# Patient Record
Sex: Male | Born: 1940 | Race: Black or African American | Hispanic: No | Marital: Married | Smoking: Former smoker
Health system: Southern US, Community
[De-identification: ages and names within clinical notes are randomized; demographics above are authoritative.]

## PROBLEM LIST (undated history)

## (undated) DIAGNOSIS — IMO0001 Reserved for inherently not codable concepts without codable children: Secondary | ICD-10-CM

## (undated) DIAGNOSIS — K219 Gastro-esophageal reflux disease without esophagitis: Secondary | ICD-10-CM

## (undated) DIAGNOSIS — N200 Calculus of kidney: Secondary | ICD-10-CM

## (undated) DIAGNOSIS — E7800 Pure hypercholesterolemia, unspecified: Secondary | ICD-10-CM

## (undated) DIAGNOSIS — K5730 Diverticulosis of large intestine without perforation or abscess without bleeding: Secondary | ICD-10-CM

## (undated) DIAGNOSIS — H6980 Other specified disorders of Eustachian tube, unspecified ear: Secondary | ICD-10-CM

## (undated) DIAGNOSIS — E785 Hyperlipidemia, unspecified: Secondary | ICD-10-CM

## (undated) DIAGNOSIS — E669 Obesity, unspecified: Secondary | ICD-10-CM

## (undated) DIAGNOSIS — E0590 Thyrotoxicosis, unspecified without thyrotoxic crisis or storm: Secondary | ICD-10-CM

## (undated) DIAGNOSIS — R972 Elevated prostate specific antigen [PSA]: Secondary | ICD-10-CM

## (undated) DIAGNOSIS — R011 Cardiac murmur, unspecified: Secondary | ICD-10-CM

## (undated) DIAGNOSIS — J449 Chronic obstructive pulmonary disease, unspecified: Secondary | ICD-10-CM

## (undated) DIAGNOSIS — T783XXA Angioneurotic edema, initial encounter: Secondary | ICD-10-CM

## (undated) DIAGNOSIS — I10 Essential (primary) hypertension: Secondary | ICD-10-CM

## (undated) DIAGNOSIS — M109 Gout, unspecified: Secondary | ICD-10-CM

## (undated) DIAGNOSIS — E042 Nontoxic multinodular goiter: Secondary | ICD-10-CM

## (undated) HISTORY — DX: Chronic obstructive pulmonary disease, unspecified: J44.9

## (undated) HISTORY — DX: Elevated prostate specific antigen (PSA): R97.20

## (undated) HISTORY — DX: Thyrotoxicosis, unspecified without thyrotoxic crisis or storm: E05.90

## (undated) HISTORY — DX: Pure hypercholesterolemia, unspecified: E78.00

## (undated) HISTORY — DX: Obesity, unspecified: E66.9

## (undated) HISTORY — DX: Essential (primary) hypertension: I10

## (undated) HISTORY — DX: Gout, unspecified: M10.9

## (undated) HISTORY — DX: Angioneurotic edema, initial encounter: T78.3XXA

## (undated) HISTORY — DX: Nontoxic multinodular goiter: E04.2

## (undated) HISTORY — DX: Other specified disorders of Eustachian tube, unspecified ear: H69.80

## (undated) HISTORY — DX: Diverticulosis of large intestine without perforation or abscess without bleeding: K57.30

---
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
## 1988-10-23 HISTORY — PX: HERNIA REPAIR: SHX51

## 1988-10-23 HISTORY — PX: KNEE SURGERY: SHX244

## 2002-02-22 LAB — HM COLONOSCOPY

## 2004-02-23 HISTORY — PX: CARDIOVASCULAR STRESS TEST: SHX262

## 2006-03-01 ENCOUNTER — Ambulatory Visit: Payer: Self-pay | Admitting: Family Medicine

## 2006-05-12 ENCOUNTER — Ambulatory Visit: Payer: Self-pay | Admitting: Family Medicine

## 2006-05-18 ENCOUNTER — Ambulatory Visit: Payer: Self-pay | Admitting: Family Medicine

## 2006-08-30 ENCOUNTER — Telehealth: Payer: Self-pay | Admitting: Family Medicine

## 2006-10-28 ENCOUNTER — Ambulatory Visit: Payer: Self-pay | Admitting: Family Medicine

## 2006-12-16 ENCOUNTER — Ambulatory Visit: Payer: Self-pay | Admitting: Family Medicine

## 2006-12-21 ENCOUNTER — Encounter: Payer: Self-pay | Admitting: Family Medicine

## 2006-12-21 ENCOUNTER — Ambulatory Visit: Payer: Self-pay | Admitting: Family Medicine

## 2006-12-21 DIAGNOSIS — M25559 Pain in unspecified hip: Secondary | ICD-10-CM | POA: Insufficient documentation

## 2006-12-21 DIAGNOSIS — M545 Low back pain, unspecified: Secondary | ICD-10-CM | POA: Insufficient documentation

## 2006-12-22 ENCOUNTER — Encounter: Payer: Self-pay | Admitting: Family Medicine

## 2006-12-23 DIAGNOSIS — R9389 Abnormal findings on diagnostic imaging of other specified body structures: Secondary | ICD-10-CM | POA: Insufficient documentation

## 2006-12-28 DIAGNOSIS — H6980 Other specified disorders of Eustachian tube, unspecified ear: Secondary | ICD-10-CM | POA: Insufficient documentation

## 2006-12-28 DIAGNOSIS — H6990 Unspecified Eustachian tube disorder, unspecified ear: Secondary | ICD-10-CM | POA: Insufficient documentation

## 2006-12-28 DIAGNOSIS — L723 Sebaceous cyst: Secondary | ICD-10-CM | POA: Insufficient documentation

## 2006-12-28 HISTORY — DX: Other specified disorders of Eustachian tube, unspecified ear: H69.80

## 2006-12-28 HISTORY — DX: Unspecified eustachian tube disorder, unspecified ear: H69.90

## 2006-12-29 ENCOUNTER — Encounter: Payer: Self-pay | Admitting: Family Medicine

## 2006-12-29 ENCOUNTER — Ambulatory Visit: Payer: Self-pay | Admitting: Family Medicine

## 2006-12-30 ENCOUNTER — Encounter (INDEPENDENT_AMBULATORY_CARE_PROVIDER_SITE_OTHER): Payer: Self-pay | Admitting: *Deleted

## 2007-01-05 ENCOUNTER — Encounter (INDEPENDENT_AMBULATORY_CARE_PROVIDER_SITE_OTHER): Payer: Self-pay | Admitting: *Deleted

## 2007-01-12 ENCOUNTER — Encounter: Payer: Self-pay | Admitting: Family Medicine

## 2007-01-12 DIAGNOSIS — K5730 Diverticulosis of large intestine without perforation or abscess without bleeding: Secondary | ICD-10-CM

## 2007-01-12 DIAGNOSIS — E669 Obesity, unspecified: Secondary | ICD-10-CM

## 2007-01-12 DIAGNOSIS — I10 Essential (primary) hypertension: Secondary | ICD-10-CM

## 2007-01-12 DIAGNOSIS — N200 Calculus of kidney: Secondary | ICD-10-CM | POA: Insufficient documentation

## 2007-01-12 DIAGNOSIS — M109 Gout, unspecified: Secondary | ICD-10-CM

## 2007-01-12 DIAGNOSIS — M1A9XX0 Chronic gout, unspecified, without tophus (tophi): Secondary | ICD-10-CM | POA: Insufficient documentation

## 2007-01-12 HISTORY — DX: Diverticulosis of large intestine without perforation or abscess without bleeding: K57.30

## 2007-01-12 HISTORY — DX: Obesity, unspecified: E66.9

## 2007-01-12 HISTORY — DX: Essential (primary) hypertension: I10

## 2007-01-12 HISTORY — DX: Gout, unspecified: M10.9

## 2007-01-31 ENCOUNTER — Ambulatory Visit: Payer: Self-pay | Admitting: Family Medicine

## 2007-03-01 ENCOUNTER — Telehealth: Payer: Self-pay | Admitting: Family Medicine

## 2007-03-02 ENCOUNTER — Ambulatory Visit: Payer: Self-pay | Admitting: Family Medicine

## 2007-05-31 ENCOUNTER — Ambulatory Visit: Payer: Self-pay | Admitting: Family Medicine

## 2007-06-20 ENCOUNTER — Ambulatory Visit: Payer: Self-pay | Admitting: Family Medicine

## 2007-06-20 DIAGNOSIS — E7800 Pure hypercholesterolemia, unspecified: Secondary | ICD-10-CM

## 2007-06-20 HISTORY — DX: Pure hypercholesterolemia, unspecified: E78.00

## 2007-06-22 ENCOUNTER — Ambulatory Visit: Payer: Self-pay | Admitting: Family Medicine

## 2007-06-22 LAB — CONVERTED CEMR LAB
OCCULT 1: NEGATIVE
OCCULT 3: NEGATIVE

## 2007-06-23 LAB — CONVERTED CEMR LAB
Albumin: 3.6 g/dL (ref 3.5–5.2)
Alkaline Phosphatase: 29 units/L — ABNORMAL LOW (ref 39–117)
BUN: 19 mg/dL (ref 6–23)
Bilirubin, Direct: 0.1 mg/dL (ref 0.0–0.3)
Calcium: 9.2 mg/dL (ref 8.4–10.5)
GFR calc Af Amer: 86 mL/min
GFR calc non Af Amer: 71 mL/min
HDL: 53.1 mg/dL (ref >39.0)
LDL Cholesterol: 105 mg/dL — ABNORMAL HIGH (ref 0–99)
PSA: 2.22 ng/mL (ref 0.10–4.00)
Potassium: 3.7 meq/L (ref 3.5–5.1)
Sodium: 142 meq/L (ref 135–145)
VLDL: 12 mg/dL (ref 0–40)

## 2007-11-20 ENCOUNTER — Ambulatory Visit: Payer: Self-pay | Admitting: Family Medicine

## 2008-04-18 ENCOUNTER — Ambulatory Visit: Payer: Self-pay | Admitting: Family Medicine

## 2008-05-22 ENCOUNTER — Ambulatory Visit: Payer: Self-pay | Admitting: Family Medicine

## 2008-05-22 LAB — CONVERTED CEMR LAB: LDL Goal: 130 mg/dL

## 2008-07-19 ENCOUNTER — Ambulatory Visit: Payer: Self-pay | Admitting: Family Medicine

## 2008-07-19 LAB — CONVERTED CEMR LAB
Alkaline Phosphatase: 39 units/L (ref 39–117)
Bilirubin, Direct: 0.1 mg/dL (ref 0.0–0.3)
CO2: 31 meq/L (ref 19–32)
Calcium: 9.2 mg/dL (ref 8.4–10.5)
Creatinine, Ser: 1 mg/dL (ref 0.4–1.5)
GFR calc non Af Amer: 95.67 mL/min (ref >60)
HDL: 55.4 mg/dL (ref >39.00)
LDL Cholesterol: 95 mg/dL (ref 0–99)
Sodium: 144 meq/L (ref 135–145)
Total Bilirubin: 1.4 mg/dL — ABNORMAL HIGH (ref 0.3–1.2)
Total CHOL/HDL Ratio: 3
Total Protein: 6.6 g/dL (ref 6.0–8.3)
Triglycerides: 68 mg/dL (ref 0.0–149.0)

## 2008-07-29 ENCOUNTER — Telehealth: Payer: Self-pay | Admitting: Family Medicine

## 2008-07-30 ENCOUNTER — Ambulatory Visit: Payer: Self-pay | Admitting: Family Medicine

## 2008-11-06 ENCOUNTER — Ambulatory Visit: Payer: Self-pay | Admitting: Family Medicine

## 2008-11-06 DIAGNOSIS — J309 Allergic rhinitis, unspecified: Secondary | ICD-10-CM | POA: Insufficient documentation

## 2008-11-07 ENCOUNTER — Telehealth: Payer: Self-pay | Admitting: Family Medicine

## 2008-12-04 ENCOUNTER — Ambulatory Visit: Payer: Self-pay | Admitting: Family Medicine

## 2009-01-30 ENCOUNTER — Ambulatory Visit: Payer: Self-pay | Admitting: Family Medicine

## 2009-02-20 ENCOUNTER — Ambulatory Visit: Payer: Self-pay | Admitting: Family Medicine

## 2009-09-09 ENCOUNTER — Ambulatory Visit: Payer: Self-pay | Admitting: Family Medicine

## 2009-09-09 ENCOUNTER — Telehealth (INDEPENDENT_AMBULATORY_CARE_PROVIDER_SITE_OTHER): Payer: Self-pay | Admitting: *Deleted

## 2009-09-09 LAB — CONVERTED CEMR LAB
ALT: 14 units/L (ref 0–53)
AST: 21 units/L (ref 0–37)
Albumin: 3.7 g/dL (ref 3.5–5.2)
Alkaline Phosphatase: 40 units/L (ref 39–117)
BUN: 19 mg/dL (ref 6–23)
CO2: 31 meq/L (ref 19–32)
Chloride: 106 meq/L (ref 96–112)
Cholesterol: 170 mg/dL (ref 0–200)
GFR calc non Af Amer: 92.15 mL/min (ref >60)
Glucose, Bld: 106 mg/dL — ABNORMAL HIGH (ref 70–99)
PSA: 4.15 ng/mL — ABNORMAL HIGH (ref 0.10–4.00)
Potassium: 3.6 meq/L (ref 3.5–5.1)
Sodium: 140 meq/L (ref 135–145)
VLDL: 13.2 mg/dL (ref 0.0–40.0)

## 2009-10-08 ENCOUNTER — Ambulatory Visit: Payer: Self-pay | Admitting: Family Medicine

## 2009-10-08 DIAGNOSIS — R972 Elevated prostate specific antigen [PSA]: Secondary | ICD-10-CM

## 2009-10-08 HISTORY — DX: Elevated prostate specific antigen (PSA): R97.20

## 2009-10-09 LAB — CONVERTED CEMR LAB
PSA, Free: 1.1 ng/mL
PSA: 4.3 ng/mL — ABNORMAL HIGH (ref 0.10–4.00)

## 2009-10-10 LAB — CONVERTED CEMR LAB: Uric Acid, Serum: 6 mg/dL (ref 4.0–7.8)

## 2009-10-17 ENCOUNTER — Telehealth: Payer: Self-pay | Admitting: Family Medicine

## 2009-11-06 ENCOUNTER — Encounter: Payer: Self-pay | Admitting: Family Medicine

## 2009-11-25 ENCOUNTER — Encounter: Payer: Self-pay | Admitting: Family Medicine

## 2009-11-26 ENCOUNTER — Ambulatory Visit: Payer: Self-pay | Admitting: Family Medicine

## 2009-12-02 ENCOUNTER — Encounter: Payer: Self-pay | Admitting: Family Medicine

## 2010-03-24 NOTE — Consult Note (Signed)
Summary: [facility]  [facility]   Imported By: Edmonia James 12/02/2009 13:11:32  _____________________________________________________________________  External Attachment:    Type:   Image     Comment:   External Document

## 2010-03-24 NOTE — Progress Notes (Signed)
----   Converted from flag ---- ---- 09/08/2009 11:18 PM, Eliezer Lofts MD wrote: CMET, LIPIDS, PSA Dx 272.0, v76.44  ---- 09/08/2009 1:43 PM, Daralene Milch CMA (AAMA) wrote: Pt is scheduled for cpx labs tomorrow, what labs to draw and dx codes? Thanks Tasha ------------------------------

## 2010-03-24 NOTE — Consult Note (Signed)
Summary: [facility]  [facility]   Imported By: Edmonia James 11/14/2009 09:09:23  _____________________________________________________________________  External Attachment:    Type:   Image     Comment:   External Document

## 2010-03-24 NOTE — Assessment & Plan Note (Signed)
Summary: FLU SHOT/CLE  Nurse Visit   Allergies: No Known Drug Allergies  Orders Added: 1)  Flu Vaccine 18yrs + MEDICARE PATIENTS [Q2039] 2)  Administration Flu vaccine - MCR [id number]  Flu Vaccine Consent Questions     Do you have a history of severe allergic reactions to this vaccine? no    Any prior history of allergic reactions to egg and/or gelatin? no    Do you have a sensitivity to the preservative Thimersol? no    Do you have a past history of Guillan-Barre Syndrome? no    Do you currently have an acute febrile illness? no    Have you ever had a severe reaction to latex? no    Vaccine information given and explained to patient? yes    Are you currently pregnant? no    Lot Number:[id number]   Exp Date:08/22/2010   Site Given  Left Deltoid IMu

## 2010-03-24 NOTE — Progress Notes (Signed)
Summary: wants referral to urologist  Phone Note Call from Patient Call back at Home Phone [phone] Call back at [phone]   Caller: [place] Call For: Eliezer Lofts MD Summary of Call: Pt's wife would like for pt to be referred to a urologist, based on his PSA results,  doesnt have a preference where. Initial call taken by: Marty Heck CMA,  October 17, 2009 2:27 PM  Follow-up for Phone Call        Referral made to Urololgy. Follow-up by: Eliezer Lofts MD,  October 20, 2009 3:42 PM  Additional Follow-up for Phone Call Additional follow up Details #1::        Patient advised.Cathlean Cower CMA   Additional Follow-up by: Zenda Alpers CMA Deborra Medina),  October 20, 2009 3:52 PM

## 2010-03-24 NOTE — Consult Note (Signed)
Summary: [facility]  [facility]   Imported By: Laural Benes 12/10/2009 14:02:52  _____________________________________________________________________  External Attachment:    Type:   Image     Comment:   External Document

## 2010-03-24 NOTE — Assessment & Plan Note (Signed)
Summary: CPX/JRR   Vital Signs:  Patient profile:   70 year old male Height:      73.5 inches Weight:      257.6 pounds BMI:     33.65 Temp:     98.8 degrees F oral Pulse rate:   64 / minute Pulse rhythm:   regular BP sitting:   120 / 74  (left arm) Cuff size:   regular  Vitals Entered By: Zenda Alpers CMA Deborra Medina) (October 08, 2009 3:28 PM) CC: Lipid Management  Does patient need assistance? Ambulation Normal  Vision Screening: Color vision testing: normal     Vision Comments: Patient wears glasses  Vision Entered By: Zenda Alpers CMA Deborra Medina) (October 08, 2009 3:29 PM) 40db HL: Left  Right  Audiometry Comment: Patient can her whisper from across the room    History of Present Illness: Chief complaint Adult cpx  HTN, well controlled on current meds. Exercisng 4-5 days a week. No change in diet.     Lipid Management History:      Positive NCEP/ATP III risk factors include male age 16 years old or older and hypertension.  Negative NCEP/ATP III risk factors include non-tobacco-user status.     Problems Prior to Update: 1)  Back Pain  (ICD-724.5) 2)  Allergic Rhinitis  (ICD-477.9) 3)  Uri  (ICD-465.9) 4)  Rotator Cuff Syndrome, Left  (ICD-726.10) 5)  Special Screening Malig Neoplasms Other Sites  (ICD-V76.49) 6)  Special Screening Malignant Neoplasm of Prostate  (ICD-V76.44) 7)  Hypercholesterolemia  (ICD-272.0) 8)  Obesity  (ICD-278.00) 9)  Renal Calculus, Uric Acid  (ICD-274.11) 10)  Hypertension  (ICD-401.9) 11)  Gout  (ICD-274.9) 12)  Diverticulosis, Colon  (ICD-562.10) 13)  Sebaceous Cyst  (ICD-706.2) 14)  Eustachian Tube Dysfunction, Bilateral  (ICD-381.81) 15)  Oth Nonspec Abn Find Rad&oth Ex Body Structure  (ICD-793.99) 16)  Low Back Pain, Chronic  (ICD-724.2) 17)  Hip Pain, Left  (ICD-719.45)  Current Medications (verified): 1)  Allopurinol 300 Mg Tabs (Allopurinol) .... Take 1 Tablet By Mouth Once A Day 2)  Terazosin Hcl 5 Mg Caps (Terazosin  Hcl) .... Take 1 Capsule By Mouth Once A Day 3)  Enalapril-Hydrochlorothiazide 10-25 Mg Tabs (Enalapril-Hydrochlorothiazide) .... Take 1 Tablet By Mouth Once A Day 4)  Nasonex 50 Mcg/act Susp (Mometasone Furoate) .... Spray 2 Spray As Directed Once A Day As Needed 5)  Singulair 10 Mg Tabs (Montelukast Sodium) .Marland Kitchen.. 1 By Mouth Daily 6)  Azithromycin 250 Mg Tabs (Azithromycin) .... 2 Tab By Mouth Daily, 1 Tab By Mouth Daily  Allergies (verified): No Known Drug Allergies  Past History:  Past medical, surgical, family and social histories (including risk factors) reviewed, and no changes noted (except as noted below).  Past Medical History: Reviewed history from 01/12/2007 and no changes required. Diverticulosis, colon Gout Hypertension  Past Surgical History: Reviewed history from 01/12/2007 and no changes required. 1990's    Right and left inguinal hernia 1990's    Right knee surgery 1949       Left hand laceration 1945       tonsillectomy 2006       Stress test (-) 11/2005  Echo  Family History: Reviewed history from 01/12/2007 and no changes required. Father: Died 87, CVA Mother: Died 98, HTN Siblings: 2 brothers, one died of COPD, severe, double lung transplant  one died with throat CA                1 sister, healthy  Social History: Reviewed history from 01/12/2007 and no changes required. Former Smoker, quit 1990, 3 PYH Alcohol use-yes, moderate, 1 glass of wine daily, occ. scotch Drug use-no Regular exercise-yes, walks daily 40 minutes Marital Status: Married x 43 years Children: 4, healthy Occupation: Retired Hydrographic surveyor Diet:  (+) fruit, (+) veggies, (+) salad, (+) H2O  Review of Systems General:  Denies fatigue. CV:  Denies chest pain or discomfort. Resp:  Denies shortness of breath. GI:  Denies abdominal pain, bloody stools, constipation, and diarrhea. GU:  Complains of erectile dysfunction; denies decreased  libido, hematuria, nocturia, urinary frequency, and urinary hesitancy.  Physical Exam  General:  overweight but generally well appearing in NAD Ears:  External ear exam shows no significant lesions or deformities.  Otoscopic examination reveals clear canals, tympanic membranes are intact bilaterally without bulging, retraction, inflammation or discharge. Hearing is grossly normal bilaterally. Nose:  External nasal examination shows no deformity or inflammation. Nasal mucosa are pink and moist without lesions or exudates. Mouth:  Oral mucosa and oropharynx without lesions or exudates.  Teeth in good repair. Neck:  no carotid bruit or thyromegaly no cervical or supraclavicular lymphadenopathy  Lungs:  Normal respiratory effort, chest expands symmetrically. Lungs are clear to auscultation, no crackles or wheezes. Heart:  Normal rate and regular rhythm. S1 and S2 normal without gallop, murmur, click, rub or other extra sounds. Abdomen:  Bowel sounds positive,abdomen soft and non-tender without masses, organomegaly or hernias noted. Rectal:  No external abnormalities noted. Normal sphincter tone. No rectal masses or tenderness. Genitalia:  Testes bilaterally descended without nodularity, tenderness or masses. No scrotal masses or lesions. No penis lesions or urethral discharge. Prostate:  Prostate gland firm and smooth, no enlargement, nodularity, tenderness, mass, asymmetry or induration. Msk:  No deformity or scoliosis noted of thoracic or lumbar spine.   Pulses:  R and L posterior tibial pulses are full and equal bilaterally  Extremities:  trace left pedal edema and trace right pedal edema.   Skin:  Intact without suspicious lesions or rashes Psych:  Cognition and judgment appear intact. Alert and cooperative with normal attention span and concentration. No apparent delusions, illusions, hallucinations   Impression & Recommendations:  Problem # 1:  PROSTATE SPECIFIC ANTIGEN, ELEVATED  (ICD-790.93) Reval with free and total for risk placement..prostate exam today nml.  No family history of prostate cancer.   No BPH symptoms.   Orders: T-PSA Free ([id number]) T-PSA Total ([id number])  Problem # 2:  HYPERCHOLESTEROLEMIA (ICD-272.0) Well controlled with lifestyle.   Problem # 3:  HYPERTENSION (ICD-401.9)  Well controlled. Continue current medication.  His updated medication list for this problem includes:    Terazosin Hcl 5 Mg Caps (Terazosin hcl) .Marland Kitchen... Take 1 capsule by mouth once a day    Enalapril-hydrochlorothiazide 10-25 Mg Tabs (Enalapril-hydrochlorothiazide) .Marland Kitchen... Take 1 tablet by mouth once a day  Orders: Prescription Created Electronically [phone])  BP today: 120/74 Prior BP: 118/78 (02/20/2009)  10 Yr Risk Heart Disease: 14 % Prior 10 Yr Risk Heart Disease: 7 % (07/30/2008)  Labs Reviewed: K+: 3.6 (09/09/2009) Creat: : 1.0 (09/09/2009)   Chol: 170 (09/09/2009)   HDL: 55.50 (09/09/2009)   LDL: 101 (09/09/2009)   TG: 66.0 (09/09/2009)  Problem # 4:  GOUT (ICD-274.9) Due for uric acid reeval.  His updated medication list for this problem includes:    Allopurinol 300  Mg Tabs (Allopurinol) .Marland Kitchen... Take 1 tablet by mouth once a day  Orders: TLB-Uric Acid, Blood (84550-URIC)  Problem # 5:  Preventive Health Care (ICD-V70.0) Assessment: Comment Only The patient's preventative maintenance and recommended screening tests for an annual wellness exam were reviewed in full today. Brought up to date unless services declined.  Counselled on the importance of diet, exercise, and its role in overall health and mortality. The patient's FH and SH was reviewed, including their home life, tobacco status, and drug and alcohol status.     Complete Medication List: 1)  Allopurinol 300 Mg Tabs (Allopurinol) .... Take 1 tablet by mouth once a day 2)  Terazosin Hcl 5 Mg Caps (Terazosin hcl) .... Take 1 capsule by mouth once a day 3)  Enalapril-hydrochlorothiazide 10-25  Mg Tabs (Enalapril-hydrochlorothiazide) .... Take 1 tablet by mouth once a day 4)  Nasonex 50 Mcg/act Susp (Mometasone furoate) .... Spray 2 spray as directed once a day as needed 5)  Singulair 10 Mg Tabs (Montelukast sodium) .Marland Kitchen.. 1 by mouth daily 6)  Azithromycin 250 Mg Tabs (Azithromycin) .... 2 tab by mouth daily, 1 tab by mouth daily  Lipid Assessment/Plan:      Based on NCEP/ATP III, the patient's risk factor category is "2 or more risk factors and a calculated 10 year CAD risk of < 20%".  The patient's lipid goals are as follows: Total cholesterol goal is 200; LDL cholesterol goal is 130; HDL cholesterol goal is 40; Triglyceride goal is 150.    Patient Instructions: 1)  We will call with PSA results. 2)  Please schedule a follow-up appointment in 1 year.  3)  Call if interested in shingles vaccine. 4)   Continue work on weight loss and exercise.  Prescriptions: SINGULAIR 10 MG TABS (MONTELUKAST SODIUM) 1 by mouth daily  #30 x 3   Entered and Authorized by:   Eliezer Lofts MD   Signed by:   Eliezer Lofts MD on 10/08/2009   Method used:   Electronically to        [facility] (retail)             ,          Ph: [phone]       Fax: [phone]   [id number] NASONEX 50 MCG/ACT SUSP (MOMETASONE FUROATE) Spray 2 spray as directed once a day as needed  #3 x 1   Entered and Authorized by:   Eliezer Lofts MD   Signed by:   Eliezer Lofts MD on 10/08/2009   Method used:   Electronically to        [facility] (retail)             ,          Ph: [phone]       Fax: [phone]   RxIDKR:[id number] ENALAPRIL-HYDROCHLOROTHIAZIDE 10-25 MG TABS (ENALAPRIL-HYDROCHLOROTHIAZIDE) Take 1 tablet by mouth once a day  #90 x 3   Entered and Authorized by:   Eliezer Lofts MD   Signed by:   Eliezer Lofts MD on 10/08/2009   Method used:   Electronically to        [facility] (retail)             ,          Ph: [phone]       Fax: [phone]   [id number] TERAZOSIN HCL 5 MG CAPS (TERAZOSIN HCL) Take 1 capsule  by mouth once a day  #90 x 3   Entered and Authorized by:   Eliezer Lofts MD   Signed by:   Eliezer Lofts MD on 10/08/2009   Method used:   Electronically to        [facility] (retail)             ,          Ph: [phone]       Fax: [phone]   [id number] ALLOPURINOL 300 MG TABS (ALLOPURINOL) Take 1 tablet by mouth once a day  #90 x 3   Entered and Authorized by:   Eliezer Lofts MD   Signed by:   Eliezer Lofts MD on 10/08/2009   Method used:   Electronically to        [facility] (retail)             ,          Ph: [phone]       Fax: [phone]   [id number]    Herpes Zoster Next Due:  Refused Hemoccult Next Due:  Not Indicated  Appended Document: CPX/JRR

## 2010-04-06 ENCOUNTER — Encounter: Payer: Self-pay | Admitting: Family Medicine

## 2010-04-06 ENCOUNTER — Ambulatory Visit (INDEPENDENT_AMBULATORY_CARE_PROVIDER_SITE_OTHER): Payer: Medicare Other | Admitting: Family Medicine

## 2010-04-06 DIAGNOSIS — R609 Edema, unspecified: Secondary | ICD-10-CM | POA: Insufficient documentation

## 2010-04-07 ENCOUNTER — Encounter: Payer: Self-pay | Admitting: Family Medicine

## 2010-04-07 ENCOUNTER — Ambulatory Visit (INDEPENDENT_AMBULATORY_CARE_PROVIDER_SITE_OTHER): Payer: Medicare Other | Admitting: Family Medicine

## 2010-04-07 DIAGNOSIS — I10 Essential (primary) hypertension: Secondary | ICD-10-CM

## 2010-04-15 NOTE — Assessment & Plan Note (Signed)
Summary: SWOLLEN,LIP,SWOLLEN LYMPH NODES/CLE  MEDICARE/AETNA   Vital Signs:  Patient profile:   70 year old male Height:      73.5 inches Weight:      257 pounds BMI:     33.57 Temp:     98.6 degrees F oral Pulse rate:   72 / minute Pulse rhythm:   regular BP sitting:   130 / 80  (left arm) Cuff size:   large  Vitals Entered By: Christena Deem CMA Deborra Medina) (April 06, 2010 3:26 PM) CC: Swollen lip and lymph glands   History of Present Illness: 3 weeks ago patient had a head cold with congestion, rhinorrhea, some sputum production.  Most of that resolved except for the cough and now with upper lip swelling for 2 days.  Some better today.  Sore sensation in neck.  No new foods.  No new soaps, etc.  No new meds except for avodart in 10/11.  No dysphagia.  Not short of breath, nonsmoker now, quit in 1990.  No tongue symptoms.  No voice change today.  On ACE for years.    Allergies (verified): 1)  ! Ace Inhibitors 2)  ! 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  Otherwise negative.    Physical Exam  General:  no apparent distress normocephalic atraumatic tm wnl  nasal exam wnl OP w/o erythema, mucous membranes moist, good posterior clearance. upper lip is mildly swollen but not tender to palpation, lower lip not tender to palpation or swollen neck supple, no LA, not tender to palpation no stridor regular rate and rhythm clear to auscultation bilaterally no increase in wob ext well perfused   Impression & Recommendations:  Problem # 1:  EDEMA (ICD-782.3) This is likely related to the ACE.  I would stop this and block for H1/H2 (this may not have sig benefit, but is low risk and may potentially provide some help with symptoms).  Nontoxic.  I d/w patient that he should not take ACE and notify MDs of potential ACE reaction in the future.  In  meantime, if increase in symptoms he'll go to ER/dial 911.  Okay for outpatient follow up.  Will check back tomorrow and we can make plans about his BP.  He agrees/understands.  The following medications were removed from the medication list:    Enalapril-hydrochlorothiazide 10-25 Mg Tabs (Enalapril-hydrochlorothiazide) .Marland Kitchen... Take 1 tablet by mouth once a day  Complete Medication List: 1)  Allopurinol 300 Mg Tabs (Allopurinol) .... Take 1 tablet by mouth once a day 2)  Terazosin Hcl 5 Mg Caps (Terazosin hcl) .... Take 1 capsule by mouth once a day 3)  Nasonex 50 Mcg/act Susp (Mometasone furoate) .... Spray 2 spray as directed once a day as needed 4)  Singulair 10 Mg Tabs (Montelukast sodium) .Marland Kitchen.. 1 by mouth daily as needed 5)  Avodart 0.5 Mg Caps (Dutasteride) .... Take 1 capsule by mouth once a day  Patient Instructions: 1)  Stop the enalapril/hctz.  Take claritin 10mg  a day and zantac 75mg  two times a day.  I want to see you back tomorrow.  If you get short of breath or more swelling, dial 911 and to to the hospitial.   2)  If someone asks, tell them you had lip swelling on an ACE inhibitor.    Orders Added: 1)  Est. Patient Level III [id number]  Current Allergies (reviewed today): ! ACE INHIBITORS ! * ANGIOTENSIN RECEPTOR BLOCKERS

## 2010-04-15 NOTE — Assessment & Plan Note (Signed)
Summary: TUESDAY FOLLOW UP / LFW   Vital Signs:  Patient profile:   70 year old male Height:      73.5 inches Weight:      260.75 pounds BMI:     34.06 Temp:     98.6 degrees F oral Pulse rate:   76 / minute Pulse rhythm:   regular BP sitting:   126 / 78  (left arm) Cuff size:   large  Vitals Entered By: Christena Deem CMA Deborra Medina) (April 07, 2010 3:09 PM) CC: Tuesday follow up   History of Present Illness: Here to follow up from yesterday.  Lip edema improve, no stridor/dysphagia/sob.  No drooling, no tongue changes.  stopped the ace.  feeling well o/w.   Allergies: 1)  ! Ace Inhibitors 2)  ! * Angiotensin Receptor Blockers  Review of Systems       See HPI.  Otherwise negative.    Physical Exam  General:  no apparent distress normocephalic atraumatic lips not edematous and wnl. op wnl with normal clearance. neck supple, no la, normal range of motion regular rate and rhythm clear to auscultation bilaterally    Impression & Recommendations:  Problem # 1:  HYPERTENSION (ICD-401.9) Continue H1H2 blockade for a 5 more days, continue terazosin for now.  On Monday of next week, stop H1H2 and then add on HCTZ.  follow up with Dr. Diona Browner after that re: BP.  I told him to avoid ACE/ARB in the future and he understood.  If he gets short of breath/has other lip changes, then to ER.  He understood.   His updated medication list for this problem includes:    Terazosin Hcl 5 Mg Caps (Terazosin hcl) .Marland Kitchen... Take 1 capsule by mouth once a day    Hydrochlorothiazide 25 Mg Tabs (Hydrochlorothiazide) .Marland Kitchen... 1 by mouth once daily  Orders: Prescription Created Electronically [phone])  Complete Medication List: 1)  Allopurinol 300 Mg Tabs (Allopurinol) .... Take 1 tablet by mouth once a day 2)  Terazosin Hcl 5 Mg Caps (Terazosin hcl) .... Take 1 capsule by mouth once a day 3)  Nasonex 50 Mcg/act Susp (Mometasone furoate) .... Spray 2 spray as directed once a day as needed 4)  Singulair  10 Mg Tabs (Montelukast sodium) .Marland Kitchen.. 1 by mouth daily as needed 5)  Avodart 0.5 Mg Caps (Dutasteride) .... Take 1 capsule by mouth once a day 6)  Hydrochlorothiazide 25 Mg Tabs (Hydrochlorothiazide) .Marland Kitchen.. 1 by mouth once daily  Patient Instructions: 1)  Keep taking zantac 75mg  by mouth two times a day and claritin 10mg  a day for the next 5 days.  Keep taking the hytrin (terazosin) in the meantime.  On Monday, start taking the plain hydrochlorothiazide.  I would like you to follow up with Dr. Diona Browner about 2 weeks from now to check up on your blood pressure.  If you get any more lip swelling in the meantine or if you get short of breath, then go to the ER.  Take care.  Prescriptions: HYDROCHLOROTHIAZIDE 25 MG TABS (HYDROCHLOROTHIAZIDE) 1 by mouth once daily  #30 x 1   Entered and Authorized by:   Elsie Stain MD   Signed by:   Elsie Stain MD on 04/07/2010   Method used:   Electronically to        [facility] (retail)       [place], [place],  [place]  [address]       Ph: [phone]       Fax: [phone]   RxID:   [id number]    Orders Added: 1)  Prescription Created Electronically [id number] 2)  Est. Patient Level III [id number]    Current Allergies (reviewed today): ! ACE INHIBITORS ! * ANGIOTENSIN RECEPTOR BLOCKERS

## 2010-04-21 ENCOUNTER — Other Ambulatory Visit: Payer: Self-pay | Admitting: Family Medicine

## 2010-04-21 ENCOUNTER — Ambulatory Visit (INDEPENDENT_AMBULATORY_CARE_PROVIDER_SITE_OTHER): Payer: Medicare Other | Admitting: Family Medicine

## 2010-04-21 ENCOUNTER — Encounter: Payer: Self-pay | Admitting: Family Medicine

## 2010-04-21 DIAGNOSIS — M542 Cervicalgia: Secondary | ICD-10-CM

## 2010-04-21 DIAGNOSIS — R609 Edema, unspecified: Secondary | ICD-10-CM

## 2010-04-21 DIAGNOSIS — Z79899 Other long term (current) drug therapy: Secondary | ICD-10-CM

## 2010-04-21 DIAGNOSIS — J029 Acute pharyngitis, unspecified: Secondary | ICD-10-CM | POA: Insufficient documentation

## 2010-04-21 DIAGNOSIS — I10 Essential (primary) hypertension: Secondary | ICD-10-CM

## 2010-04-21 LAB — TSH: TSH: 0.02 u[IU]/mL — ABNORMAL LOW (ref 0.35–5.50)

## 2010-04-24 DIAGNOSIS — E0590 Thyrotoxicosis, unspecified without thyrotoxic crisis or storm: Secondary | ICD-10-CM

## 2010-04-24 HISTORY — DX: Thyrotoxicosis, unspecified without thyrotoxic crisis or storm: E05.90

## 2010-04-27 ENCOUNTER — Other Ambulatory Visit: Payer: Self-pay | Admitting: Family Medicine

## 2010-04-27 DIAGNOSIS — E0590 Thyrotoxicosis, unspecified without thyrotoxic crisis or storm: Secondary | ICD-10-CM

## 2010-04-28 ENCOUNTER — Other Ambulatory Visit: Payer: Medicare Other

## 2010-04-30 NOTE — Assessment & Plan Note (Signed)
Summary: F/U PER DR DUNCAN/CLE   MEDICARE/AETNA   Vital Signs:  Patient profile:   70 year old male Height:      73.5 inches Weight:      257.50 pounds BMI:     33.63 Temp:     98.2 degrees F oral Pulse rate:   76 / minute Pulse rhythm:   regular BP sitting:   130 / 88  (left arm) Cuff size:   large  Vitals Entered By: Zenda Alpers CMA Deborra Medina) (April 21, 2010 9:33 AM)  History of Present Illness: Chief complaint follow up per dr Damita Dunnings   HTN.. recent episode of sudden angioedema 2/13.Marland Kitchen thought to be secondary to ACEI. No further lip swelling. Throat soreness resolved.  No other known allergens. Still has some phelgm in throat and throat soreness. Clearing throat a lot.... worse at night when clearing thorat.  No heartburn after eating.  Stopped claritin.   Removed from this med. ... BP have been well controlled.  He is not taking HCTZ .Marland Kitchen because was unable to get this at pharm, not sure why. Not checking BPs at home.    using nasonex  Problems Prior to Update: 1)  Edema  (ICD-782.3) 2)  Prostate Specific Antigen, Elevated  (ICD-790.93) 3)  Allergic Rhinitis  (ICD-477.9) 4)  Special Screening Malig Neoplasms Other Sites  (ICD-V76.49) 5)  Special Screening Malignant Neoplasm of Prostate  (ICD-V76.44) 6)  Hypercholesterolemia  (ICD-272.0) 7)  Obesity  (ICD-278.00) 8)  Renal Calculus, Uric Acid  (ICD-274.11) 9)  Hypertension  (ICD-401.9) 10)  Gout  (ICD-274.9) 11)  Diverticulosis, Colon  (ICD-562.10) 12)  Sebaceous Cyst  (ICD-706.2) 13)  Eustachian Tube Dysfunction, Bilateral  (ICD-381.81) 14)  Oth Nonspec Abn Find Rad&oth Ex Body Structure  (ICD-793.99) 15)  Low Back Pain, Chronic  (ICD-724.2) 16)  Hip Pain, Left  (ICD-719.45)  Current Medications (verified): 1)  Allopurinol 300 Mg Tabs (Allopurinol) .... Take 1 Tablet By Mouth Once A Day 2)  Terazosin Hcl 5 Mg Caps (Terazosin Hcl) .... Take 1 Capsule By Mouth Once A Day 3)  Nasonex 50 Mcg/act Susp  (Mometasone Furoate) .... Spray 2 Spray As Directed Once A Day As Needed 4)  Singulair 10 Mg Tabs (Montelukast Sodium) .Marland Kitchen.. 1 By Mouth Daily As Needed 5)  Avodart 0.5 Mg Caps (Dutasteride) .... Take 1 Capsule By Mouth Once A Day 6)  Hydrochlorothiazide 25 Mg Tabs (Hydrochlorothiazide) .Marland Kitchen.. 1 By Mouth Once Daily  Allergies: 1)  ! Ace Inhibitors 2)  ! * Angiotensin Receptor Blockers  Past History:  Past medical, surgical, family and social histories (including risk factors) reviewed, and no changes noted (except as noted below).  Past Medical History: Reviewed history from 04/06/2010 and no changes required. Diverticulosis, colon Gout Hypertension 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 angioedema  Past Surgical History: Reviewed history from 01/12/2007 and no changes required. 1990's    Right and left inguinal hernia 1990's    Right knee surgery 1949       Left hand laceration 1945       tonsillectomy 2006       Stress test (-) 11/2005  Echo  Family History: Reviewed history from 01/12/2007 and no changes required. Father: Died 87, CVA Mother: Died 98, HTN Siblings: 2 brothers, one died of COPD, severe, double lung transplant                                  one died  with throat CA                1 sister, healthy  Social History: Reviewed history from 01/12/2007 and no changes required. Former Smoker, quit 1990, 71 PYH Alcohol use-yes, moderate, 1 glass of wine daily, occ. scotch Drug use-no Regular exercise-yes, walks daily 40 minutes Marital Status: Married x 43 years Children: 4, healthy Occupation: Retired Hydrographic surveyor Diet:  (+) fruit, (+) veggies, (+) salad, (+) H2O  Review of Systems       no sneezing Has a lot of tearing, puffiness around eyes, has crust in AMs ongoing for months...seen eye doctor last JUne.. 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 told to use saline drops in eyes. General:  Denies fatigue and fever. CV:  Denies chest pain or discomfort. Resp:  Denies  shortness of breath. GI:  Denies abdominal pain. GU:  Denies dysuria.  Physical Exam  General:  overweight male in NAD  Eyes:  No corneal or conjunctival inflammation noted. EOMI. Perrla. Funduscopic exam benign, without hemorrhages, exudates or papilledema. Vision grossly normal. Ears:  External ear exam shows no significant lesions or deformities.  Otoscopic examination reveals clear canals, tympanic membranes are intact bilaterally without bulging, retraction, inflammation or discharge. Hearing is grossly normal bilaterally. Nose:  External nasal examination shows no deformity or inflammation. Nasal mucosa are pink and moist without lesions or exudates. Mouth:  Oral mucosa and oropharynx without lesions or exudates.  Teeth in good repair. Neck:  ttp over right lobe of thyroid.. ?mild enlargement (right lobe at least larger than left), no nodules,  Lungs:  Normal respiratory effort, chest expands symmetrically. Lungs are clear to auscultation, no crackles or wheezes. Heart:  Normal rate and regular rhythm. S1 and S2 normal without gallop, murmur, click, rub or other extra sounds. Pulses:  R and L posterior tibial pulses are full and equal bilaterally  Extremities:  no edema    Impression & Recommendations:  Problem # 1:  EDEMA (ICD-782.3) Resolved of ACEI. His updated medication list for this problem includes:    Hydrochlorothiazide 25 Mg Tabs (Hydrochlorothiazide) .Marland Kitchen... 1 by mouth once daily  Problem # 2:  SORE THROAT (ICD-462) Most consistent with GERD.Marland Kitchen worsened by recent ACE I allergy.  Make dietary changes. Start prilosec 40 mg daily for 4-6 weeks. Call if not improving.   Allergies well controlled on nasal steroid.  Problem # 3:  NECK PAIN (ICD-723.1) ? localized over right lobe pof thyroid. Check TSH.Marland Kitchen if not improving consider thyroid US.  Orders: TLB-TSH (Thyroid Stimulating Hormone) (84443-TSH)  Complete Medication List: 1)  Allopurinol 300 Mg Tabs (Allopurinol) ....  Take 1 tablet by mouth once a day 2)  Terazosin Hcl 5 Mg Caps (Terazosin hcl) .... Take 1 capsule by mouth once a day 3)  Nasonex 50 Mcg/act Susp (Mometasone furoate) .... Spray 2 spray as directed once a day as needed 4)  Singulair 10 Mg Tabs (Montelukast sodium) .Marland Kitchen.. 1 by mouth daily as needed 5)  Avodart 0.5 Mg Caps (Dutasteride) .... Take 1 capsule by mouth once a day 6)  Hydrochlorothiazide 25 Mg Tabs (Hydrochlorothiazide) .Marland Kitchen.. 1 by mouth once daily  Patient Instructions: 1)  Avoid caffeine, alcohol, chocolate, citirs, tomato, spicy. 2)   Start prilosec 2 tab by mouth daily for 4-6 weeks... then taper off. 3)   If no improvement with this.. call. Prescriptions: HYDROCHLOROTHIAZIDE 25 MG TABS (HYDROCHLOROTHIAZIDE) 1 by mouth once daily  #90 x 3   Entered and Authorized by:   Warren Lacy  Diona Browner MD   Signed by:   Eliezer Lofts MD on 04/21/2010   Method used:   Faxed to ...       [facility] (mail-order)             , [place]         Ph: [phone]       Fax: [phone]   RxID:   [id number]    Orders Added: 1)  TLB-TSH (Thyroid Stimulating Hormone) [84443-TSH] 2)  Est. Patient Level IV [id number]    Current Allergies (reviewed today): ! ACE INHIBITORS ! * ANGIOTENSIN RECEPTOR BLOCKERS

## 2010-05-01 ENCOUNTER — Ambulatory Visit
Admission: RE | Admit: 2010-05-01 | Discharge: 2010-05-01 | Disposition: A | Discharge status: Home / Self Care | Payer: Medicare Other | Source: Ambulatory Visit | Attending: Family Medicine | Admitting: Family Medicine

## 2010-05-01 ENCOUNTER — Encounter: Payer: Self-pay | Admitting: Endocrinology

## 2010-05-01 DIAGNOSIS — E0590 Thyrotoxicosis, unspecified without thyrotoxic crisis or storm: Secondary | ICD-10-CM

## 2010-05-04 ENCOUNTER — Encounter: Payer: Self-pay | Admitting: Endocrinology

## 2010-05-04 ENCOUNTER — Ambulatory Visit (INDEPENDENT_AMBULATORY_CARE_PROVIDER_SITE_OTHER): Payer: Medicare Other | Admitting: Endocrinology

## 2010-05-04 DIAGNOSIS — E042 Nontoxic multinodular goiter: Secondary | ICD-10-CM

## 2010-05-04 DIAGNOSIS — E0590 Thyrotoxicosis, unspecified without thyrotoxic crisis or storm: Secondary | ICD-10-CM

## 2010-05-04 HISTORY — DX: Nontoxic multinodular goiter: E04.2

## 2010-05-06 ENCOUNTER — Other Ambulatory Visit: Payer: Self-pay | Admitting: Endocrinology

## 2010-05-06 DIAGNOSIS — E0590 Thyrotoxicosis, unspecified without thyrotoxic crisis or storm: Secondary | ICD-10-CM

## 2010-05-12 NOTE — Assessment & Plan Note (Signed)
Summary: NEW ENDO HYPERTHY MEDICARE PT-PKG-#--CYNTHIA/DR BEDSOLE--STC   Vital Signs:  Patient profile:   70 year old male Height:      73.5 inches (186.69 cm) Weight:      249.13 pounds (113.24 kg) BMI:     32.54 O2 Sat:      96 % on Room air Temp:     98.7 degrees F (37.06 degrees C) oral Pulse rate:   80 / minute Pulse rhythm:   regular BP sitting:   108 / 68  (left arm) Cuff size:   large  Vitals Entered By: Rebeca Alert CMA Deborra Medina) (May 04, 2010 2:28 PM)  O2 Flow:  Room air CC: New Endo Consult/Hyperthyroidism/aj Is Patient Diabetic? No   Referring Provider:  Eliezer Lofts MD Primary Provider:  Eliezer Lofts MD  CC:  New Endo Consult/Hyperthyroidism/aj.  History of Present Illness: pt states few weeks of slight pain at the anterior neck, and assoc sputum production.    Current Medications (verified): 1)  Allopurinol 300 Mg Tabs (Allopurinol) .... Take 1 Tablet By Mouth Once A Day 2)  Terazosin Hcl 5 Mg Caps (Terazosin Hcl) .... Take 1 Capsule By Mouth Once A Day 3)  Nasonex 50 Mcg/act Susp (Mometasone Furoate) .... Spray 2 Spray As Directed Once A Day As Needed 4)  Singulair 10 Mg Tabs (Montelukast Sodium) .Marland Kitchen.. 1 By Mouth Daily As Needed 5)  Avodart 0.5 Mg Caps (Dutasteride) .... Take 1 Capsule By Mouth Once A Day 6)  Hydrochlorothiazide 25 Mg Tabs (Hydrochlorothiazide) .Marland Kitchen.. 1 By Mouth Once Daily 7)  Prilosec Otc 20 Mg Tbec (Omeprazole Magnesium) .Marland Kitchen.. 1 By Mouth Once Daily  Allergies (verified): 1)  ! Ace Inhibitors 2)  ! * Angiotensin Receptor Blockers  Past History:  Past Medical History: Last updated: 04/06/2010 Diverticulosis, colon Gout Hypertension 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 angioedema  Family History: Reviewed history from 01/12/2007 and no changes required. Father: Died 87, CVA Mother: Died 98, HTN Siblings: 2 brothers, one died of COPD, severe, double lung transplant.  one died with throat CA.     1 sister, healthy no thyroid dz.  Social  History: Reviewed history from 01/12/2007 and no changes required. Former Smoker, quit 1990, 1 PYH Alcohol use-yes, moderate, 1 glass of wine daily, occ. scotch Drug use-no Regular exercise-yes, walks daily 40 minutes Marital Status: Married x 43 years Children: 4, healthy Occupation: Retired Hydrographic surveyor Diet:  (+) fruit, (+) veggies, (+) salad, (+) H2O  Review of Systems  The patient denies fever.         denies weight loss, headache, double vision, palpitations, sob, diarrhea, polyuria, muscle weakness, excessive diaphoresis, numbness, tremor, anxiety, hypoglycemia, easy bruising, and rhinorrhea.   he has intermittent hoarseness.  Physical Exam  General:  normal appearance.   Head:  head: no deformity eyes: no periorbital swelling, no proptosis external nose and ears are normal mouth: no lesion seen Neck:  thyroid is approx 5x normal size, with multilolular surface.  right lobe is > left Lungs:  Clear to auscultation bilaterally. Normal respiratory effort.  Heart:  Regular rate and rhythm without murmurs or gallops noted. Normal S1,S2.   Msk:  muscle bulk and strength are grossly normal.  no obvious joint swelling.  gait is normal and steady  Extremities:  1+ right pedal edema and 1+ left pedal edema.   no deformity Neurologic:  cn 2-12 grossly intact.   readily moves all 4's.   sensation is intact to touch on all 4's there is a  light coarse tremor. Skin:  normal texture and temp.  no rash.  slightly diaphoretic  Cervical Nodes:  No significant adenopathy.  Psych:  Alert and cooperative; normal mood and affect; normal attention span and concentration.   Additional Exam:  i reviewed ultrasound with pt.   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 1 by mouth once daily  Other Orders: i-131 Thyroid uptake and scan (Capsule & Scan) Nuc Med Diagnostic (thyroid uptake & sca) Est. Patient Level V [id number]  Patient Instructions: 1)  let's check a thyroid "scan" (a special, but easy and painless type of thyroid x ray) 2)  based on the result, i hope to request a radioactive iodine pill to treat this. 3)  then please plan to return her for a follow-up appointment approx. 4-6 weeks later.     Orders Added: 1)  i-131 Thyroid uptake and scan (Capsule & Scan) Nuc Med Diagnostic [thyroid uptake & sca] 2)  Est. Patient Level V [id number]

## 2010-05-18 ENCOUNTER — Encounter (HOSPITAL_COMMUNITY)
Admission: RE | Admit: 2010-05-18 | Discharge: 2010-05-18 | Disposition: A | Discharge status: Home / Self Care | Payer: Medicare Other | Source: Ambulatory Visit | Attending: Endocrinology | Admitting: Endocrinology

## 2010-05-18 DIAGNOSIS — E0590 Thyrotoxicosis, unspecified without thyrotoxic crisis or storm: Secondary | ICD-10-CM

## 2010-05-19 ENCOUNTER — Other Ambulatory Visit: Payer: Self-pay | Admitting: Endocrinology

## 2010-05-19 ENCOUNTER — Ambulatory Visit (HOSPITAL_COMMUNITY): Admission: RE | Admit: 2010-05-19 | Payer: Medicare Other | Source: Ambulatory Visit

## 2010-05-19 DIAGNOSIS — E0590 Thyrotoxicosis, unspecified without thyrotoxic crisis or storm: Secondary | ICD-10-CM

## 2010-05-19 MED ORDER — SODIUM IODIDE I 131 CAPSULE: 12.0000 | Freq: Once | INTRAVENOUS | Status: AC | PRN

## 2010-05-20 ENCOUNTER — Other Ambulatory Visit: Payer: Self-pay | Admitting: Endocrinology

## 2010-05-20 DIAGNOSIS — E0590 Thyrotoxicosis, unspecified without thyrotoxic crisis or storm: Secondary | ICD-10-CM

## 2010-05-21 ENCOUNTER — Telehealth: Payer: Self-pay | Admitting: Endocrinology

## 2010-05-26 ENCOUNTER — Ambulatory Visit (INDEPENDENT_AMBULATORY_CARE_PROVIDER_SITE_OTHER): Payer: Medicare Other | Admitting: Endocrinology

## 2010-05-26 ENCOUNTER — Other Ambulatory Visit (INDEPENDENT_AMBULATORY_CARE_PROVIDER_SITE_OTHER): Payer: Medicare Other

## 2010-05-26 ENCOUNTER — Encounter: Payer: Self-pay | Admitting: Endocrinology

## 2010-05-26 VITALS — BP 104/76 | HR 50 | Temp 98.4°F | Ht 75.5 in | Wt 255.0 lb

## 2010-05-26 DIAGNOSIS — E069 Thyroiditis, unspecified: Secondary | ICD-10-CM

## 2010-05-26 DIAGNOSIS — E0590 Thyrotoxicosis, unspecified without thyrotoxic crisis or storm: Secondary | ICD-10-CM

## 2010-05-26 MED ORDER — LEVOTHYROXINE SODIUM 100 MCG PO TABS: 100.0000 ug | ORAL_TABLET | Freq: Every day | ORAL | Status: DC

## 2010-05-26 NOTE — Patient Instructions (Addendum)
blood tests are being ordered for you today.  please call [phone] to hear your test results. pending the test results, please continue the same medications for now. tentatively please plan to have a follow-up appointment in 1 month. (update: i left message on phone-tree:  Start synthroid 100 mcg/d).

## 2010-05-26 NOTE — Progress Notes (Signed)
  Subjective:    Patient ID: Jason Solomon, male    DOB: 07-Jan-1941, 70 y.o.   MRN: [id number]  HPI Pt was seen here 1 month ago for multinodular goiter, and hyperthyroidism.  Scan showed very low i-131 uptake.  pt states he feels no different, and well in general. Past Medical History  Diagnosis Date  . DIVERTICULOSIS, COLON 01/12/2007  . GOUT 01/12/2007  . HYPERCHOLESTEROLEMIA 06/20/2007  . 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 OBESITY 01/12/2007  . HYPERTENSION 01/12/2007  . PROSTATE SPECIFIC ANTIGEN, ELEVATED 10/08/2009  . HYPERTHYROIDISM 04/24/2010  . GOITER, MULTINODULAR 05/04/2010  . Angioedema     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   Past Surgical History  Procedure Date  . Hernia repair 1990's    right and left inguinal hernia  . Knee surgery 1990's  . Hand surgery 1949    left hand laceration  . Tonsillectomy 1945  . Cardiovascular stress test 2006    negative    reports that he quit smoking about 22 years ago. He does not have any smokeless tobacco history on file. He reports that he drinks alcohol. He reports that he does not use illicit drugs. family history includes COPD in his brother; Cancer in his brother; and Hypertension in his mother.  There is no history of Thyroid disease. Allergies  Allergen Reactions  . Ace Inhibitors     REACTION: ?angioedema  . Angiotensin Receptor Blockers     REACTION: angioedema on ACE?    Review of Systems Denies weight change    Objective:   Physical Exam GENERAL: no distress Neck:  Thyroid is 5x normal size, with multinodular surface, right > left.    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

## 2010-05-27 ENCOUNTER — Ambulatory Visit (HOSPITAL_COMMUNITY): Payer: Medicare Other

## 2010-05-28 ENCOUNTER — Encounter (HOSPITAL_COMMUNITY): Payer: Medicare Other

## 2010-06-25 ENCOUNTER — Ambulatory Visit (INDEPENDENT_AMBULATORY_CARE_PROVIDER_SITE_OTHER): Payer: Medicare Other | Admitting: Endocrinology

## 2010-06-25 ENCOUNTER — Encounter: Payer: Self-pay | Admitting: Endocrinology

## 2010-06-25 ENCOUNTER — Other Ambulatory Visit (INDEPENDENT_AMBULATORY_CARE_PROVIDER_SITE_OTHER): Payer: Medicare Other

## 2010-06-25 VITALS — BP 124/72 | HR 46 | Temp 97.9°F | Ht 75.5 in | Wt 251.8 lb

## 2010-06-25 DIAGNOSIS — E069 Thyroiditis, unspecified: Secondary | ICD-10-CM

## 2010-06-25 NOTE — Patient Instructions (Addendum)
blood tests are being ordered for you today.  please call [phone] to hear your test results.  You will be prompted to enter the 9-digit "MRN" number that appears at the top left of this page, followed by #. pending the test results, please continue the same medications for now. Please make a follow-up appointment in 6 weeks.

## 2010-06-25 NOTE — Progress Notes (Signed)
  Subjective:    Patient ID: Jason Solomon, male    DOB: 07-09-1940, 69 y.o.   MRN: [id number]  HPI Pt returns for f/u of subacute thyroiditis.  He has been on synthroid x 1 month.  pt states he feels no different, and well in general.   Review of Systems Denies weight change.    Objective:   Physical Exam GENERAL: no distress Neck:  Thyroid is now just slightly enlarged, right > left.  No nodule, but there is an irregular surface.      Lab Results  Component Value Date   TSH 2.03 06/25/2010      Assessment & Plan:  Now in hypothyroid phase of subacute thyroiditis.  Well-replaced

## 2010-06-26 LAB — T4, FREE: Free T4: 0.97 ng/dL (ref 0.60–1.60)

## 2010-07-10 NOTE — Assessment & Plan Note (Signed)
[facility] OFFICE NOTE   TATIANA, LETTER                     MRN:          [id number]  DATE:03/01/2006                            DOB:          August 31, 1940    CHIEF COMPLAINT:  A 70 year old black male who needs to establish new  doctor.   HISTORY OF PRESENT ILLNESS:  Mr. Risner moved from [place], [place]  in November 2007 for his retirement.  He comes to clinic today stating  he is in fairly good health and only has the following issue.   Low back pain, acute on chronic:  Mr. Carlis Abbott states that one time  previously, several years ago, he stood up quickly and had a period of  time with low back pain.  He now has had some recurrence of low back  pain for the past 3 months.  He states that it is fairly mild, and it  feels like stiffness that is worse in the morning, and improves with  stretching and moving around throughout the day.  He states that it may  have gotten worse since he has been recently moving, and taking boxes in  and out of his house, as well as walking up and down the stairs.  He  denies any recent falls.  He denies numbness, tingling, weakness, or  urinary incontinence.  He does have some nocturia with urine output 1-2  times per night.  He denies any radicular symptoms in his legs, although  does note occasional swelling in his right greater than his left leg at  the end of the day.  He is not taking over-the-counter medication.   REVIEW OF SYSTEMS:  No headache, no hearing problems, wears glasses, no  dyspnea, no chest pain, no shortness of breath, no nausea, vomiting,  diarrhea, constipation or rectal bleeding.  No myalgias, no arthralgias.   PAST MEDICAL HISTORY:  1. Diverticulosis.  2. Hypertension.  3. Kidney stones made of uric acid.  4. Gout, hyperuricacidemia.  5. In 1990s, right and left inguinal hernia.  6. In 1990s, right knee surgery.  7. In 1949, left hand  laceration.  8. In 1945, tonsillectomy.  9. In 2006, stress test, negative.  10.October, 2007, echocardiogram, unknown results.   ALLERGIES:  NONE.   MEDICATIONS:  1. Allopurinol 300 mg 1 tablet p.o. daily.  2. Enalapril/hydrochlorothiazide 10/25 mg 1 p.o. daily.  3. Terazosin 5 mg 1 tablet p.o. daily.   SOCIAL HISTORY:  Former smoker with 60-pack-year history of smoking.  He  currently drinks 1 glass of wine daily and occasional scotch.  He has no  history of drug use.  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  He has 4 children who are healthy.  He walks  daily, about 40 minutes, and is trying to gradually increase this to  several hours a day.  He eats fruits, vegetables, salad, and lots of  water.  He tries to eat lean meats and avoid fast foods.   FAMILY HISTORY:  Father deceased at age 29 with CVA.  Mother deceased at  age 65 with hypertension.  No MI in the family before age 59.  He has 2  brothers, 1 who passed away with COPD that was severe, resulting in  double lung transplant.  He has another brother who passed away with  throat cancer.  He has 1 sister who is healthy.  There is not a family  history of diabetes or any type of cancer.   PHYSICAL EXAMINATION:  VITAL SIGNS:  Height 73-1/2 inches, weight 272,  making BMI 29.  Blood pressure 120/70, pulse 76, temperature 98.3.  GENERAL:  Overweight-appearing male, no apparent distress.  HEENT:  PERRLA, extraocular muscles intact.  Oropharynx clear, tympanic  membranes clear, naris clear, no thyromegaly, no lymphadenopathy,  supraclavicular or cervical.  CARDIOVASCULAR:  Regular rate and rhythm, no murmurs, rubs, or gallops.  Normal PMI.  2+ peripheral pulses, no peripheral edema.  PULMONARY:  Clear to auscultation bilaterally, no wheezes, rales, or  rhonchi.  ABDOMEN:  Soft, obese, nontender, normoactive bowel sounds.  No  hepatosplenomegaly.  MUSCULOSKELETAL:  Strength 5/5 in upper  and lower extremities.  Full  range of motion with flexion/extension/lateral rotation.  Some increase  in low back pain with lateral rotation towards the left, paraspinous  muscle and vertebral bodies nontender to palpation.  Negative straight  leg test.  Good range of motion bilateral hips.  Normal Fabere's.  No  tenderness to palpation over SI joint.  NEURO:  Alert and oriented x3.  Cranial nerves II-XII grossly intact.  Reflexes 2+ bilaterally.  Sensation intact in upper and lower  extremities.   ASSESSMENT/PLAN:  1. 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  He was given information to use      ibuprofen p.r.n. pain as well as ice and heat.  He will also begin      stretching exercises to stretch his low back and strengthen his      core.  If he his is not improved within the next few weeks, he can      consider following up.  2. Prevention.  He is unsure when last tetanus was done.  He is up to      date with colon cancer, prostate cancer, cholesterol screen.  I      will obtain records from his previous doctor.     Eliezer Lofts, MD  Electronically Signed    AB/MedQ  DD: 03/01/2006  DT: 03/01/2006  Job #: [id number]

## 2010-07-21 ENCOUNTER — Other Ambulatory Visit: Payer: Self-pay

## 2010-07-21 MED ORDER — LEVOTHYROXINE SODIUM 100 MCG PO TABS: 100.0000 ug | ORAL_TABLET | Freq: Every day | ORAL | Status: DC

## 2010-07-21 NOTE — Telephone Encounter (Signed)
Pt has appt 06/21 and requested refills until appt

## 2010-07-23 NOTE — Telephone Encounter (Signed)
Done

## 2010-08-13 ENCOUNTER — Other Ambulatory Visit (INDEPENDENT_AMBULATORY_CARE_PROVIDER_SITE_OTHER): Payer: Medicare Other

## 2010-08-13 ENCOUNTER — Ambulatory Visit (INDEPENDENT_AMBULATORY_CARE_PROVIDER_SITE_OTHER): Payer: Medicare Other | Admitting: Endocrinology

## 2010-08-13 ENCOUNTER — Encounter: Payer: Self-pay | Admitting: Endocrinology

## 2010-08-13 ENCOUNTER — Other Ambulatory Visit: Payer: Medicare Other

## 2010-08-13 VITALS — BP 122/78 | HR 47 | Temp 98.2°F | Ht 75.5 in | Wt 253.8 lb

## 2010-08-13 DIAGNOSIS — E069 Thyroiditis, unspecified: Secondary | ICD-10-CM

## 2010-08-13 LAB — TSH: TSH: 0.91 u[IU]/mL (ref 0.35–5.50)

## 2010-08-13 NOTE — Progress Notes (Signed)
Subjective:    Patient ID: Jason Solomon, male    DOB: 05-09-1940, 70 y.o.   MRN: [id number]  HPI Pt is here to f/u the hypothyroid phase of subacute thyroiditis.  pt states he feels well in general, except for a few arthralgias.   Past Medical History  Diagnosis Date  . DIVERTICULOSIS, COLON 01/12/2007  . GOUT 01/12/2007  . HYPERCHOLESTEROLEMIA 06/20/2007  . 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 OBESITY 01/12/2007  . HYPERTENSION 01/12/2007  . PROSTATE SPECIFIC ANTIGEN, ELEVATED 10/08/2009  . HYPERTHYROIDISM 04/24/2010  . GOITER, MULTINODULAR 05/04/2010  . 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 Hernia repair 1990's    right and left inguinal hernia  . Knee surgery 1990's  . Hand surgery 1949    left hand laceration  . Tonsillectomy 1945  . Cardiovascular stress test 2006    negative    History   Social History  . Marital Status: Married    Spouse Name: N/A    Number of Children: 4  . Years of Education: N/A   Occupational History  .      Retired   Social History Main Topics  . Smoking status: Former Smoker    Quit date: 02/23/1988  . Smokeless tobacco: Not on file  . Alcohol Use: Yes     moderate-1 glass of wine daily, occ. scotch  . Drug Use: No  . Sexually Active: Not on file   Other Topics Concern  . Not on file   Social History Narrative   Regular exercise-yes, walks daily 40 minutesOccupation: Retired Forensic scientist: (+) fruit, (+) veggies, (+) salad, (+) H20Children: 4, healthyMarried x 43 years    Current Outpatient Prescriptions on File Prior to Visit  Medication Sig Dispense Refill  . allopurinol (ZYLOPRIM) 300 MG tablet Take 300 mg by mouth daily.        Marland Kitchen dutasteride (AVODART) 0.5 MG capsule Take 0.5 mg by mouth daily.        . hydrochlorothiazide 25 MG tablet Take 25 mg by mouth daily.        Marland Kitchen levothyroxine (SYNTHROID, LEVOTHROID) 100 MCG tablet Take 1 tablet (100 mcg  total) by mouth daily.  30 tablet  0  . mometasone (NASONEX) 50 MCG/ACT nasal spray 2 sprays by Nasal route as needed.       . montelukast (SINGULAIR) 10 MG tablet Take 10 mg by mouth daily as needed.        . terazosin (HYTRIN) 5 MG capsule Take 5 mg by mouth at bedtime.        Marland Kitchen DISCONTD: omeprazole (PRILOSEC OTC) 20 MG tablet Take 20 mg by mouth daily.          Allergies  Allergen Reactions  . Ace Inhibitors     REACTION: ?angioedema  . Angiotensin Receptor Blockers     REACTION: angioedema on ACE?    Family History  Problem Relation Age of Onset  . Hypertension Mother   . COPD Brother   . Thyroid disease Neg Hx   . Cancer Brother     Throat Cancer    BP 122/78  Pulse 47  Temp(Src) 98.2 F (36.8 C) (Oral)  Ht 6' 3.5" (1.918 m)  Wt 253 lb 12.8 oz (115.123 kg)  BMI 31.30 kg/m2  SpO2 96% Review of Systems Denies fever.    Objective:   Physical Exam GENERAL: no distress  Neck: Thyroid is now  just slightly enlarged, right > left. No nodule, but there is an irregular surface. Skin: not diaphoretic    Assessment & Plan:  Subacute thyroiditis, now in hypothyroid phase.

## 2010-08-13 NOTE — Patient Instructions (Addendum)
blood tests are being ordered for you today.  please call [phone] to hear your test results.  You will be prompted to enter the 9-digit "MRN" number that appears at the top left of this page, followed by #. pending the test results, please continue the same medication for now. Please make a follow-up appointment in 2 months. (pt wants 90 days to [facility]).

## 2010-08-29 ENCOUNTER — Other Ambulatory Visit: Payer: Self-pay | Admitting: Endocrinology

## 2010-08-31 ENCOUNTER — Other Ambulatory Visit: Payer: Self-pay

## 2010-08-31 MED ORDER — LEVOTHYROXINE SODIUM 100 MCG PO TABS: 100.0000 ug | ORAL_TABLET | Freq: Every day | ORAL | Status: DC

## 2010-09-22 ENCOUNTER — Other Ambulatory Visit: Payer: Self-pay | Admitting: Family Medicine

## 2010-10-12 ENCOUNTER — Other Ambulatory Visit: Payer: Medicare Other

## 2010-10-15 ENCOUNTER — Telehealth: Payer: Self-pay | Admitting: Family Medicine

## 2010-10-15 DIAGNOSIS — E069 Thyroiditis, unspecified: Secondary | ICD-10-CM

## 2010-10-15 DIAGNOSIS — E7800 Pure hypercholesterolemia, unspecified: Secondary | ICD-10-CM

## 2010-10-15 DIAGNOSIS — M109 Gout, unspecified: Secondary | ICD-10-CM

## 2010-10-15 DIAGNOSIS — R972 Elevated prostate specific antigen [PSA]: Secondary | ICD-10-CM

## 2010-10-15 DIAGNOSIS — N200 Calculus of kidney: Secondary | ICD-10-CM

## 2010-10-15 DIAGNOSIS — I10 Essential (primary) hypertension: Secondary | ICD-10-CM

## 2010-10-15 NOTE — Telephone Encounter (Signed)
Message copied by Jinny Sanders on Thu Oct 15, 2010  5:21 PM ------      Message from: Marchia Bond      Created: Mon Oct 05, 2010 10:49 AM      Regarding: cpx labs fri 8/24       Please order  future cpx labs for pt's upcomming lab appt.      Thanks      Aniceto Boss

## 2010-10-16 ENCOUNTER — Other Ambulatory Visit (INDEPENDENT_AMBULATORY_CARE_PROVIDER_SITE_OTHER): Payer: Medicare Other | Admitting: Family Medicine

## 2010-10-16 ENCOUNTER — Encounter: Payer: Medicare Other | Admitting: Family Medicine

## 2010-10-16 DIAGNOSIS — M109 Gout, unspecified: Secondary | ICD-10-CM

## 2010-10-16 DIAGNOSIS — I10 Essential (primary) hypertension: Secondary | ICD-10-CM

## 2010-10-16 DIAGNOSIS — E069 Thyroiditis, unspecified: Secondary | ICD-10-CM

## 2010-10-16 DIAGNOSIS — N200 Calculus of kidney: Secondary | ICD-10-CM

## 2010-10-16 DIAGNOSIS — E7800 Pure hypercholesterolemia, unspecified: Secondary | ICD-10-CM

## 2010-10-16 LAB — COMPREHENSIVE METABOLIC PANEL
ALT: 16 U/L (ref 0–53)
AST: 18 U/L (ref 0–37)
Albumin: 3.6 g/dL (ref 3.5–5.2)
Alkaline Phosphatase: 44 U/L (ref 39–117)
Calcium: 9.2 mg/dL (ref 8.4–10.5)
Chloride: 103 mEq/L (ref 96–112)
Potassium: 3.7 mEq/L (ref 3.5–5.1)
Sodium: 140 mEq/L (ref 135–145)
Total Protein: 6.6 g/dL (ref 6.0–8.3)

## 2010-10-16 LAB — LIPID PANEL
LDL Cholesterol: 74 mg/dL (ref 0–99)
Total CHOL/HDL Ratio: 3
VLDL: 22.2 mg/dL (ref 0.0–40.0)

## 2010-10-19 ENCOUNTER — Ambulatory Visit (INDEPENDENT_AMBULATORY_CARE_PROVIDER_SITE_OTHER): Payer: Medicare Other | Admitting: Endocrinology

## 2010-10-19 ENCOUNTER — Encounter: Payer: Self-pay | Admitting: Endocrinology

## 2010-10-19 VITALS — BP 126/78 | HR 62 | Temp 97.8°F | Ht 75.5 in | Wt 256.4 lb

## 2010-10-19 DIAGNOSIS — E061 Subacute thyroiditis: Secondary | ICD-10-CM

## 2010-10-19 DIAGNOSIS — E042 Nontoxic multinodular goiter: Secondary | ICD-10-CM

## 2010-10-19 NOTE — Progress Notes (Signed)
Subjective:    Patient ID: Jason Solomon, male    DOB: 1940-04-11, 70 y.o.   MRN: [id number]  HPI Pt was first seen here approx 5 mos ago, with hyperthyroidism presumed to be due to subacute thyroiditis, in view of cold scan.  He takes synthroid 100 mcg/d.  pt states he feels well in general, except for fatigue. Past Medical History  Diagnosis Date  . DIVERTICULOSIS, COLON 01/12/2007  . GOUT 01/12/2007  . HYPERCHOLESTEROLEMIA 06/20/2007  . 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 OBESITY 01/12/2007  . HYPERTENSION 01/12/2007  . PROSTATE SPECIFIC ANTIGEN, ELEVATED 10/08/2009  . HYPERTHYROIDISM 04/24/2010  . GOITER, MULTINODULAR 05/04/2010  . 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 Hernia repair 1990's    right and left inguinal hernia  . Knee surgery 1990's  . Hand surgery 1949    left hand laceration  . Tonsillectomy 1945  . Cardiovascular stress test 2006    negative    History   Social History  . Marital Status: Married    Spouse Name: N/A    Number of Children: 4  . Years of Education: N/A   Occupational History  .      Retired   Social History Main Topics  . Smoking status: Former Smoker    Quit date: 02/23/1988  . Smokeless tobacco: Not on file  . Alcohol Use: Yes     moderate-1 glass of wine daily, occ. scotch  . Drug Use: No  . Sexually Active: Not on file   Other Topics Concern  . Not on file   Social History Narrative   Regular exercise-yes, walks daily 40 minutesOccupation: Retired Forensic scientist: (+) fruit, (+) veggies, (+) salad, (+) H20Children: 4, healthyMarried x 43 years    Current Outpatient Prescriptions on File Prior to Visit  Medication Sig Dispense Refill  . allopurinol (ZYLOPRIM) 300 MG tablet TAKE 1 TABLET DAILY  90 tablet  2  . hydrochlorothiazide 25 MG tablet Take 25 mg by mouth daily.        Marland Kitchen levothyroxine (SYNTHROID, LEVOTHROID) 100 MCG tablet Take 1 tablet (100  mcg total) by mouth daily.  90 tablet  1  . mometasone (NASONEX) 50 MCG/ACT nasal spray 2 sprays by Nasal route as needed.       . montelukast (SINGULAIR) 10 MG tablet Take 10 mg by mouth daily as needed.        . terazosin (HYTRIN) 5 MG capsule TAKE 1 CAPSULE ONCE DAILY  90 capsule  2  . dutasteride (AVODART) 0.5 MG capsule Take 0.5 mg by mouth daily.          Allergies  Allergen Reactions  . Ace Inhibitors     REACTION: ?angioedema  . Angiotensin Receptor Blockers     REACTION: angioedema on ACE?    Family History  Problem Relation Age of Onset  . Hypertension Mother   . COPD Brother   . Thyroid disease Neg Hx   . Cancer Brother     Throat Cancer   BP 126/78  Pulse 62  Temp(Src) 97.8 F (36.6 C) (Oral)  Ht 6' 3.5" (1.918 m)  Wt 256 lb 6.4 oz (116.302 kg)  BMI 31.62 kg/m2  SpO2 93%  Review of Systems Denies neck pain    Objective:   Physical Exam VITAL SIGNS:  See vs page GENERAL: no distress Neck: thyroid is slightly enlarged, but i can't tell details. Lab  Results  Component Value Date   TSH 0.05* 10/16/2010      Assessment & Plan:  Subacute thyroiditis, apparently resolved.

## 2010-10-19 NOTE — Patient Instructions (Addendum)
Stop levothyroxine. In approx 1 month, go to the lab for another thyroid blood test.  please call [phone] to hear your test results.  You will be prompted to enter the 9-digit "MRN" number that appears at the top left of this page, followed by #.  Then you will hear the message. Please make a follow-up appointment in 6 months.

## 2010-10-20 ENCOUNTER — Encounter: Payer: Medicare Other | Admitting: Family Medicine

## 2010-10-22 ENCOUNTER — Encounter: Payer: Self-pay | Admitting: Family Medicine

## 2010-10-22 ENCOUNTER — Ambulatory Visit (INDEPENDENT_AMBULATORY_CARE_PROVIDER_SITE_OTHER): Payer: Medicare Other | Admitting: Family Medicine

## 2010-10-22 VITALS — BP 114/80 | HR 61 | Temp 97.8°F | Wt 255.4 lb

## 2010-10-22 DIAGNOSIS — R609 Edema, unspecified: Secondary | ICD-10-CM

## 2010-10-22 DIAGNOSIS — R972 Elevated prostate specific antigen [PSA]: Secondary | ICD-10-CM

## 2010-10-22 DIAGNOSIS — I10 Essential (primary) hypertension: Secondary | ICD-10-CM

## 2010-10-22 DIAGNOSIS — Z0000 Encounter for general adult medical examination without abnormal findings: Secondary | ICD-10-CM

## 2010-10-22 DIAGNOSIS — E7800 Pure hypercholesterolemia, unspecified: Secondary | ICD-10-CM

## 2010-10-22 DIAGNOSIS — M109 Gout, unspecified: Secondary | ICD-10-CM

## 2010-10-22 DIAGNOSIS — E069 Thyroiditis, unspecified: Secondary | ICD-10-CM

## 2010-10-22 NOTE — Assessment & Plan Note (Signed)
Likely due to varicose veins... Elevate legs, compression hose.

## 2010-10-22 NOTE — Patient Instructions (Addendum)
Great back on track with exercise. Consider aspirin 81 mg daily. Call insurance to look into coverage of shingles vaccine. Look into setting up living will and HCPOA. Work on getting back to exercise and work on weight loss.

## 2010-10-22 NOTE — Progress Notes (Signed)
Subjective:    Patient ID: Jason Solomon, male    DOB: 07/01/1940, 70 y.o.   MRN: [id number]  HPI  I have personally reviewed the Medicare Annual Wellness questionnaire and have noted 1. The patient's medical and social history 2. Their use of alcohol, tobacco or illicit drugs 3. Their current medications and supplements 4. The patient's functional ability including ADL's, fall risks, home safety risks and hearing or visual             impairment. 5. Diet and physical activities 6. Evidence for depression or mood disorders  The patients weight, height, BMI and visual acuity have been recorded in the chart I have made referrals, counseling and provided education to the patient based review of the above and I have provided the pt with a written personalized care plan for preventive services.  Hypertension:   Well controlled on HCTZ   Using medication without problems or lightheadedness:  Chest pain with exertion: None Edema:None Short of breath:Occ Average home BPs: At home 128-133/85-90   Elevated Cholesterol:Well controlled on diet, improved even further from last year.  No exercise recently   Thyroditis, now in hypothyroid stage.. Followed by Dr. Loanne Drilling. Stopped levothyroxine because last TSH too high. Plan repeat in 1 month of TSH.  Elevated PSA: Followed by Dr.  Janice Norrie... Felt it was due to infection.. Treated with CIPRO. On hytrin, stopped avodart. No current urinary issue, no flow issue, does urinate 3 times at night.  ED: used cialis in past... Never tried a whole, 1/2 tablet did not help. No problems with sex drive. Stopped avodart.. May have had some SE to this.  Gout: On allopurinol, uric acid well controlled. No recent gout flares or new stones.    Review of Systems  Constitutional: Negative for fever, fatigue and unexpected weight change.  HENT: Negative for ear pain, congestion, sore throat, rhinorrhea, trouble swallowing and postnasal drip.   Eyes:  Negative for pain.  Respiratory: Negative for cough, shortness of breath and wheezing.   Cardiovascular: Negative for chest pain, palpitations and leg swelling.  Gastrointestinal: Negative for nausea, abdominal pain, diarrhea, constipation and blood in stool.  Genitourinary: Negative for dysuria, urgency, hematuria, discharge, penile swelling, scrotal swelling, difficulty urinating, penile pain and testicular pain.       ED  Skin: Negative for rash.  Neurological: Negative for syncope, weakness, light-headedness, numbness and headaches.  Psychiatric/Behavioral: Negative for behavioral problems and dysphoric mood. The patient is not nervous/anxious.        Objective:   Physical Exam  Constitutional: He appears well-developed and well-nourished.  Non-toxic appearance. He does not appear ill. No distress.  HENT:  Head: Normocephalic and atraumatic.  Right Ear: Hearing, tympanic membrane, external ear and ear canal normal.  Left Ear: Hearing, tympanic membrane, external ear and ear canal normal.  Nose: Nose normal.  Mouth/Throat: Uvula is midline, oropharynx is clear and moist and mucous membranes are normal.  Eyes: Conjunctivae, EOM and lids are normal. Pupils are equal, round, and reactive to light. No foreign bodies found.  Neck: Trachea normal, normal range of motion and phonation normal. Neck supple. Carotid bruit is not present. No mass and no thyromegaly present.  Cardiovascular: Normal rate, regular rhythm, S1 normal, S2 normal, intact distal pulses and normal pulses.  Exam reveals no gallop.   No murmur heard. Pulmonary/Chest: Breath sounds normal. He has no wheezes. He has no rhonchi. He has no rales.  Abdominal: Soft. Normal appearance and bowel sounds are normal. There  is no hepatosplenomegaly. There is no tenderness. There is no rebound, no guarding and no CVA tenderness. No hernia.  Lymphadenopathy:    He has no cervical adenopathy.  Neurological: He is alert. He has normal  strength and normal reflexes. No cranial nerve deficit or sensory deficit. Gait normal.  Skin: Skin is warm, dry and intact. No rash noted.  Psychiatric: He has a normal mood and affect. His speech is normal and behavior is normal. Judgment normal.          Assessment & Plan:  Annual Medicare Wellness Exam The patient's preventative maintenance and recommended screening tests for an annual wellness exam were reviewed in full today. Brought up to date unless services declined.  Counselled on the importance of diet, exercise, and its role in overall health and mortality. The patient's FH and SH was reviewed, including their home life, tobacco status, and drug and alcohol status.   LAst colonoscopy in 2007.Marland Kitchen Repeat in 10 years.  PSA per Dr. Janice Norrie. Discussed shingles, Up to Date with PNA vaccine and Tdap.

## 2010-10-22 NOTE — Assessment & Plan Note (Signed)
Stable on allopurinol

## 2010-10-22 NOTE — Assessment & Plan Note (Signed)
Well controlled. Continue current medication.  

## 2010-10-22 NOTE — Assessment & Plan Note (Signed)
Great control with diet and lifestyle.

## 2010-10-22 NOTE — Assessment & Plan Note (Signed)
Per Dr. Ellison 

## 2010-10-22 NOTE — Assessment & Plan Note (Signed)
Per Dr. Janice Norrie.

## 2010-11-18 ENCOUNTER — Other Ambulatory Visit (INDEPENDENT_AMBULATORY_CARE_PROVIDER_SITE_OTHER): Payer: Medicare Other

## 2010-11-18 DIAGNOSIS — E042 Nontoxic multinodular goiter: Secondary | ICD-10-CM

## 2010-11-18 LAB — TSH: TSH: 0.02 u[IU]/mL — ABNORMAL LOW (ref 0.35–5.50)

## 2010-11-24 ENCOUNTER — Ambulatory Visit (INDEPENDENT_AMBULATORY_CARE_PROVIDER_SITE_OTHER): Payer: Medicare Other

## 2010-11-24 DIAGNOSIS — Z23 Encounter for immunization: Secondary | ICD-10-CM

## 2010-11-27 ENCOUNTER — Encounter: Payer: Self-pay | Admitting: Family Medicine

## 2010-11-27 ENCOUNTER — Ambulatory Visit (INDEPENDENT_AMBULATORY_CARE_PROVIDER_SITE_OTHER): Payer: Medicare Other | Admitting: Family Medicine

## 2010-11-27 VITALS — BP 118/82 | HR 70 | Temp 98.4°F | Ht 75.5 in | Wt 252.5 lb

## 2010-11-27 DIAGNOSIS — S83419A Sprain of medial collateral ligament of unspecified knee, initial encounter: Secondary | ICD-10-CM | POA: Insufficient documentation

## 2010-11-27 MED ORDER — MELOXICAM 15 MG PO TABS: 15.0000 mg | ORAL_TABLET | Freq: Every day | ORAL | Status: DC

## 2010-11-27 NOTE — Assessment & Plan Note (Signed)
Treat with elevation, ice, knee brace, NSAIDs. Start gentle exercises as able. Follow up in 2 weeks if not improving.

## 2010-11-27 NOTE — Patient Instructions (Signed)
Start meloxicam daily for inflammation and pain. Get simple over the counter knee brace to stabilize knee when standing or walking distances. Ice knee as directed in info packet. Elevated knee when sitting. Start gentle strengthening exercises and progress as info suggests. Follow up in 2 weeks if pain not improving or resolved.

## 2010-11-27 NOTE — Progress Notes (Signed)
  Subjective:    Patient ID: Jason Solomon, male    DOB: [birth year], 70 y.o.   MRN: [id number]  HPI  70 year old male presents with  Left knee pain.Marland Kitchen onging soreness over last few weeks. Noted pain after doing yard work.. Using aerator... Walking standing 2-3 hours long...occassionally on slope, incline pulling knee sideways. No fall, no twist injury known.   Mild swelling, no redness, mild heat in joint. Tenderness primarily on medial knee at joint line.  Pain with weight bearing, worse with bending and walking up stairs. No click, pop, no grind. Does not catch or get stuck in a position.  Longtime ago... "water on the knee in that knee" when he was playing sports. No past surgery.   Has not been taking any med for it.      Review of Systems  Constitutional: Negative for fever and fatigue.  Respiratory: Negative for cough and shortness of breath.   Cardiovascular: Negative for chest pain.       Objective:   Physical Exam  Constitutional: He is oriented to person, place, and time. He appears well-developed and well-nourished.  Neck: Normal range of motion. Neck supple.  Cardiovascular: Normal rate, regular rhythm, normal heart sounds and intact distal pulses.  Exam reveals no gallop and no friction rub.   No murmur heard. Pulmonary/Chest: Effort normal and breath sounds normal. No respiratory distress. He has no wheezes. He has no rales. He exhibits no tenderness.  Musculoskeletal:       Right knee: Normal.       Left knee: He exhibits swelling. He exhibits no effusion, no deformity, no erythema, normal patellar mobility and no MCL laxity. tenderness found. Medial joint line tenderness noted. No lateral joint line, no MCL, no LCL and no patellar tendon tenderness noted.       Neg MCMurrary's  Neurological: He is alert and oriented to person, place, and time.  Skin: Skin is warm and dry.          Assessment & Plan:

## 2010-12-10 ENCOUNTER — Telehealth: Payer: Self-pay | Admitting: *Deleted

## 2010-12-10 NOTE — Telephone Encounter (Signed)
Pt left message regarding TSH lab results last month and what he needed to do regarding F/U-please advise

## 2010-12-11 NOTE — Telephone Encounter (Addendum)
Left message for pt to callback office.  

## 2010-12-11 NOTE — Telephone Encounter (Signed)
First, please verify you are off the levothyroxine.  This test said your thyroid is overactive again.  Please make appt so we can address this.

## 2010-12-11 NOTE — Telephone Encounter (Deleted)
Left message for pt to callback office.  

## 2010-12-11 NOTE — Telephone Encounter (Signed)
Left message for pt to callback office.  

## 2010-12-11 NOTE — Telephone Encounter (Signed)
Pt's spouse informed of MD's advisement, appointment scheduled 12/14/2010 at 9:45pm.

## 2010-12-14 ENCOUNTER — Ambulatory Visit: Payer: Medicare Other | Admitting: Endocrinology

## 2010-12-15 ENCOUNTER — Other Ambulatory Visit (INDEPENDENT_AMBULATORY_CARE_PROVIDER_SITE_OTHER): Payer: Medicare Other

## 2010-12-15 ENCOUNTER — Ambulatory Visit (INDEPENDENT_AMBULATORY_CARE_PROVIDER_SITE_OTHER): Payer: Medicare Other | Admitting: Endocrinology

## 2010-12-15 ENCOUNTER — Encounter: Payer: Self-pay | Admitting: Endocrinology

## 2010-12-15 ENCOUNTER — Other Ambulatory Visit: Payer: Self-pay

## 2010-12-15 VITALS — BP 116/82 | HR 62 | Temp 97.5°F | Ht 75.5 in | Wt 252.0 lb

## 2010-12-15 DIAGNOSIS — E069 Thyroiditis, unspecified: Secondary | ICD-10-CM

## 2010-12-15 LAB — TSH: TSH: 0.04 u[IU]/mL — ABNORMAL LOW (ref 0.35–5.50)

## 2010-12-15 MED ORDER — AZITHROMYCIN 500 MG PO TABS: 500.0000 mg | ORAL_TABLET | Freq: Every day | ORAL | Status: DC

## 2010-12-15 MED ORDER — AZITHROMYCIN 500 MG PO TABS: 500.0000 mg | ORAL_TABLET | Freq: Every day | ORAL | Status: AC

## 2010-12-15 NOTE — Patient Instructions (Addendum)
i have sent a prescription to your pharmacy, for an antibiotic Due to your thyroid problem, you should avoid decongestants.  Instead, take saline nasal spray as needed for congestion. blood tests are being requested for you today.  please call [phone] to hear your test results.  You will be prompted to enter the 9-digit "MRN" number that appears at the top left of this page, followed by #.  Then you will hear the message. If the thyroid is overactive again, you should re-try the nuclear medicine "scan" (2-day test, as you did last spring). (update: i left message on phone-tree:  Thyroid is still high--i ordered scan)

## 2010-12-15 NOTE — Progress Notes (Signed)
Subjective:    Patient ID: Jason Solomon, male    DOB: 30-Jan-1941, 70 y.o.   MRN: [id number]  HPI Pt was first seen by me earlier this year, for hyperthyroidism.  He had a low radioiodine uptake.  Ultrasound showed a small right lobe nodule.  The hyperthyroidism spontaneously resolved. But has now recurred.   Pt states 4 days of moderate conestion in the nose, but no assoc earache. Past Medical History  Diagnosis Date  . DIVERTICULOSIS, COLON 01/12/2007  . GOUT 01/12/2007  . HYPERCHOLESTEROLEMIA 06/20/2007  . 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 OBESITY 01/12/2007  . HYPERTENSION 01/12/2007  . PROSTATE SPECIFIC ANTIGEN, ELEVATED 10/08/2009  . HYPERTHYROIDISM 04/24/2010  . GOITER, MULTINODULAR 05/04/2010  . 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 Hernia repair 1990's    right and left inguinal hernia  . Knee surgery 1990's  . Hand surgery 1949    left hand laceration  . Tonsillectomy 1945  . Cardiovascular stress test 2006    negative    History   Social History  . Marital Status: Married    Spouse Name: N/A    Number of Children: 4  . Years of Education: N/A   Occupational History  .      Retired   Social History Main Topics  . Smoking status: Former Smoker    Quit date: 02/23/1988  . Smokeless tobacco: Not on file  . Alcohol Use: Yes     moderate-1 glass of wine daily, occ. scotch  . Drug Use: No  . Sexually Active: Not on file   Other Topics Concern  . Not on file   Social History Narrative   Regular exercise-yes, walks , golfOccupation: Retired Forensic scientist: (+) fruit, (+) veggies, (+) salad, (+) H20Children: 4, healthyMarried x 43 years    Current Outpatient Prescriptions on File Prior to Visit  Medication Sig Dispense Refill  . allopurinol (ZYLOPRIM) 300 MG tablet TAKE 1 TABLET DAILY  90 tablet  2  . hydrochlorothiazide 25 MG tablet Take 25 mg by mouth daily.        . meloxicam  (MOBIC) 15 MG tablet Take 1 tablet (15 mg total) by mouth daily.  20 tablet  0  . mometasone (NASONEX) 50 MCG/ACT nasal spray Place 2 sprays into the nose as needed. Only when flying      . montelukast (SINGULAIR) 10 MG tablet Take 10 mg by mouth daily as needed. Only when flying      . tadalafil (CIALIS) 20 MG tablet Take 20 mg by mouth daily as needed.        . terazosin (HYTRIN) 5 MG capsule TAKE 1 CAPSULE ONCE DAILY  90 capsule  2    Allergies  Allergen Reactions  . Ace Inhibitors     REACTION: ?angioedema  . Angiotensin Receptor Blockers     REACTION: angioedema on ACE?    Family History  Problem Relation Age of Onset  . Hypertension Mother   . COPD Brother   . Thyroid disease Neg Hx   . Cancer Brother     Throat Cancer   BP 116/82  Pulse 62  Temp(Src) 97.5 F (36.4 C) (Oral)  Ht 6' 3.5" (1.918 m)  Wt 252 lb (114.306 kg)  BMI 31.08 kg/m2  SpO2 97%  Review of Systems Denies fever.  Little if any cough.    Objective:   Physical Exam VITAL SIGNS:  See vs page GENERAL: no distress Neck: there is a 1-2 cm right thyroid nodule.   Ears: both tm's are red LUNGS:  Clear to auscultation  Lab Results  Component Value Date   TSH 0.02* 11/18/2010      Assessment & Plan:  Recurrent hyperthyroidism Right sided nodule--prob related to #1 [place], new.

## 2010-12-22 ENCOUNTER — Encounter (HOSPITAL_COMMUNITY)
Admission: RE | Admit: 2010-12-22 | Discharge: 2010-12-22 | Disposition: A | Discharge status: Home / Self Care | Payer: Medicare Other | Source: Ambulatory Visit | Attending: Endocrinology | Admitting: Endocrinology

## 2010-12-22 DIAGNOSIS — E0500 Thyrotoxicosis with diffuse goiter without thyrotoxic crisis or storm: Secondary | ICD-10-CM | POA: Insufficient documentation

## 2010-12-22 DIAGNOSIS — E069 Thyroiditis, unspecified: Secondary | ICD-10-CM

## 2010-12-23 ENCOUNTER — Encounter (HOSPITAL_COMMUNITY)
Admission: RE | Admit: 2010-12-23 | Discharge: 2010-12-23 | Disposition: A | Discharge status: Home / Self Care | Payer: Medicare Other | Source: Ambulatory Visit | Attending: Endocrinology | Admitting: Endocrinology

## 2010-12-23 MED ORDER — SODIUM IODIDE I 131 CAPSULE
7.7000 | Freq: Once | INTRAVENOUS | Status: AC | PRN
  Administered 2010-12-22: 7.7 via ORAL

## 2010-12-23 MED ORDER — SODIUM PERTECHNETATE TC 99M INJECTION
9.0000 | Freq: Once | INTRAVENOUS | Status: AC | PRN
  Administered 2010-12-23: 9 via INTRAVENOUS

## 2010-12-28 ENCOUNTER — Telehealth: Payer: Self-pay | Admitting: *Deleted

## 2010-12-28 NOTE — Telephone Encounter (Signed)
On phone-tree

## 2010-12-28 NOTE — Telephone Encounter (Signed)
Pt informed to check phone tree for lab results.

## 2010-12-28 NOTE — Telephone Encounter (Signed)
Patient requesting results of labs done on 10.23.12

## 2010-12-29 ENCOUNTER — Telehealth: Payer: Self-pay | Admitting: *Deleted

## 2010-12-29 DIAGNOSIS — E0590 Thyrotoxicosis, unspecified without thyrotoxic crisis or storm: Secondary | ICD-10-CM

## 2010-12-29 NOTE — Telephone Encounter (Signed)
Pt wants to proceed with radioactive iodine tx but wants more information about the treatment.

## 2010-12-29 NOTE — Telephone Encounter (Signed)
i called pt 12/29/10.  We discussed.  Pt agrees to to i-131 rx, and i odrered

## 2011-01-07 ENCOUNTER — Ambulatory Visit (HOSPITAL_COMMUNITY): Payer: Medicare Other

## 2011-01-18 ENCOUNTER — Encounter (HOSPITAL_COMMUNITY)
Admission: RE | Admit: 2011-01-18 | Discharge: 2011-01-18 | Disposition: A | Discharge status: Home / Self Care | Payer: Medicare Other | Source: Ambulatory Visit | Attending: Endocrinology | Admitting: Endocrinology

## 2011-01-18 DIAGNOSIS — E0590 Thyrotoxicosis, unspecified without thyrotoxic crisis or storm: Secondary | ICD-10-CM

## 2011-01-18 DIAGNOSIS — E0500 Thyrotoxicosis with diffuse goiter without thyrotoxic crisis or storm: Secondary | ICD-10-CM | POA: Insufficient documentation

## 2011-01-18 MED ORDER — SODIUM IODIDE I 131 CAPSULE
20.4000 | Freq: Once | INTRAVENOUS | Status: AC | PRN
  Administered 2011-01-18: 20.4 via ORAL

## 2011-03-01 ENCOUNTER — Ambulatory Visit (INDEPENDENT_AMBULATORY_CARE_PROVIDER_SITE_OTHER): Payer: Medicare Other | Admitting: Endocrinology

## 2011-03-01 ENCOUNTER — Encounter: Payer: Self-pay | Admitting: Endocrinology

## 2011-03-01 ENCOUNTER — Other Ambulatory Visit (INDEPENDENT_AMBULATORY_CARE_PROVIDER_SITE_OTHER): Payer: Medicare Other

## 2011-03-01 VITALS — BP 130/82 | HR 52 | Temp 97.7°F | Ht 75.5 in | Wt 257.5 lb

## 2011-03-01 DIAGNOSIS — E0590 Thyrotoxicosis, unspecified without thyrotoxic crisis or storm: Secondary | ICD-10-CM

## 2011-03-01 NOTE — Patient Instructions (Signed)
blood tests are being requested for you today.  please call [phone] to hear your test results.  You will be prompted to enter the 9-digit "MRN" number that appears at the top left of this page, followed by #.  Then you will hear the message. Please come back for a follow-up appointment in 6 weeks.

## 2011-03-01 NOTE — Progress Notes (Signed)
  Subjective:    Patient ID: Jason Solomon, male    DOB: Jun 28, 1940, 71 y.o.   MRN: [id number]  HPI Pt is 6 weeks s/p i-131 rx for hyperthyroidism, due to multinodular goiter.  pt states he feels well in general, except for a recent uri.   Past Medical History  Diagnosis Date  . DIVERTICULOSIS, COLON 01/12/2007  . GOUT 01/12/2007  . HYPERCHOLESTEROLEMIA 06/20/2007  . 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 OBESITY 01/12/2007  . HYPERTENSION 01/12/2007  . PROSTATE SPECIFIC ANTIGEN, ELEVATED 10/08/2009  . HYPERTHYROIDISM 04/24/2010  . GOITER, MULTINODULAR 05/04/2010  . 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 Hernia repair 1990's    right and left inguinal hernia  . Knee surgery 1990's  . Hand surgery 1949    left hand laceration  . Tonsillectomy 1945  . Cardiovascular stress test 2006    negative    History   Social History  . Marital Status: Married    Spouse Name: N/A    Number of Children: 4  . Years of Education: N/A   Occupational History  .      Retired   Social History Main Topics  . Smoking status: Former Smoker    Quit date: 02/23/1988  . Smokeless tobacco: Not on file  . Alcohol Use: Yes     moderate-1 glass of wine daily, occ. scotch  . Drug Use: No  . Sexually Active: Not on file   Other Topics Concern  . Not on file   Social History Narrative   Regular exercise-yes, walks , golfOccupation: Retired Forensic scientist: (+) fruit, (+) veggies, (+) salad, (+) H20Children: 4, healthyMarried x 43 years    Current Outpatient Prescriptions on File Prior to Visit  Medication Sig Dispense Refill  . allopurinol (ZYLOPRIM) 300 MG tablet TAKE 1 TABLET DAILY  90 tablet  2  . hydrochlorothiazide 25 MG tablet Take 25 mg by mouth daily.        . mometasone (NASONEX) 50 MCG/ACT nasal spray Place 2 sprays into the nose as needed. Only when flying      . montelukast (SINGULAIR) 10 MG tablet Take 10 mg  by mouth daily as needed. Only when flying      . tadalafil (CIALIS) 20 MG tablet Take 20 mg by mouth daily as needed.        . terazosin (HYTRIN) 5 MG capsule TAKE 1 CAPSULE ONCE DAILY  90 capsule  2  . meloxicam (MOBIC) 15 MG tablet Take 1 tablet (15 mg total) by mouth daily.  20 tablet  0    Allergies  Allergen Reactions  . Ace Inhibitors     REACTION: ?angioedema  . Angiotensin Receptor Blockers     REACTION: angioedema on ACE?    Family History  Problem Relation Age of Onset  . Hypertension Mother   . COPD Brother   . Thyroid disease Neg Hx   . Cancer Brother     Throat Cancer    BP 130/82  Pulse 52  Temp(Src) 97.7 F (36.5 C) (Oral)  Ht 6' 3.5" (1.918 m)  Wt 257 lb 8 oz (116.801 kg)  BMI 31.76 kg/m2  SpO2 95%    Review of Systems Denies weight change    Objective:   Physical Exam VITAL SIGNS:  See vs page GENERAL: no distress        Assessment & Plan:

## 2011-03-04 DIAGNOSIS — R972 Elevated prostate specific antigen [PSA]: Secondary | ICD-10-CM | POA: Diagnosis not present

## 2011-03-08 DIAGNOSIS — N401 Enlarged prostate with lower urinary tract symptoms: Secondary | ICD-10-CM | POA: Diagnosis not present

## 2011-03-08 DIAGNOSIS — R972 Elevated prostate specific antigen [PSA]: Secondary | ICD-10-CM | POA: Diagnosis not present

## 2011-04-03 ENCOUNTER — Other Ambulatory Visit: Payer: Self-pay | Admitting: Family Medicine

## 2011-04-12 ENCOUNTER — Ambulatory Visit: Payer: Medicare Other | Admitting: Endocrinology

## 2011-04-19 ENCOUNTER — Encounter (INDEPENDENT_AMBULATORY_CARE_PROVIDER_SITE_OTHER): Payer: Medicare Other | Admitting: Endocrinology

## 2011-04-19 ENCOUNTER — Encounter: Payer: Self-pay | Admitting: Endocrinology

## 2011-04-19 ENCOUNTER — Other Ambulatory Visit (INDEPENDENT_AMBULATORY_CARE_PROVIDER_SITE_OTHER): Payer: Medicare Other

## 2011-04-19 ENCOUNTER — Ambulatory Visit (INDEPENDENT_AMBULATORY_CARE_PROVIDER_SITE_OTHER): Payer: Medicare Other | Admitting: Endocrinology

## 2011-04-19 VITALS — BP 126/82 | HR 53 | Temp 97.6°F | Ht 75.5 in | Wt 275.8 lb

## 2011-04-19 DIAGNOSIS — E0590 Thyrotoxicosis, unspecified without thyrotoxic crisis or storm: Secondary | ICD-10-CM | POA: Diagnosis not present

## 2011-04-19 LAB — T4, FREE: Free T4: 0.33 ng/dL — ABNORMAL LOW (ref 0.60–1.60)

## 2011-04-19 MED ORDER — LEVOTHYROXINE SODIUM 100 MCG PO TABS: 100.0000 ug | ORAL_TABLET | Freq: Every day | ORAL | Status: DC

## 2011-04-19 NOTE — Patient Instructions (Addendum)
blood tests are being requested for you today.  please call [phone] to hear your test results.  You will be prompted to enter the 9-digit "MRN" number that appears at the top left of this page, followed by #.  Then you will hear the message. Please come back for a follow-up appointment in 6 weeks.

## 2011-04-19 NOTE — Progress Notes (Signed)
  Subjective:    Patient ID: Jason Solomon, male    DOB: 30-Jan-1941, 71 y.o.   MRN: [id number]  HPI Pt is 3 months s/p i-131 rx for hyperthyroidism, due to multinodular goiter.  pt states he feels no different, and well in general.   Past Medical History  Diagnosis Date  . DIVERTICULOSIS, COLON 01/12/2007  . GOUT 01/12/2007  . HYPERCHOLESTEROLEMIA 06/20/2007  . 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 OBESITY 01/12/2007  . HYPERTENSION 01/12/2007  . PROSTATE SPECIFIC ANTIGEN, ELEVATED 10/08/2009  . HYPERTHYROIDISM 04/24/2010  . GOITER, MULTINODULAR 05/04/2010  . 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 Hernia repair 1990's    right and left inguinal hernia  . Knee surgery 1990's  . Hand surgery 1949    left hand laceration  . Tonsillectomy 1945  . Cardiovascular stress test 2006    negative    History   Social History  . Marital Status: Married    Spouse Name: N/A    Number of Children: 4  . Years of Education: N/A   Occupational History  .      Retired   Social History Main Topics  . Smoking status: Former Smoker    Quit date: 02/23/1988  . Smokeless tobacco: Not on file  . Alcohol Use: Yes     moderate-1 glass of wine daily, occ. scotch  . Drug Use: No  . Sexually Active: Not on file   Other Topics Concern  . Not on file   Social History Narrative   Regular exercise-yes, walks , golfOccupation: Retired Forensic scientist: (+) fruit, (+) veggies, (+) salad, (+) H20Children: 4, healthyMarried x 43 years    Current Outpatient Prescriptions on File Prior to Visit  Medication Sig Dispense Refill  . allopurinol (ZYLOPRIM) 300 MG tablet TAKE 1 TABLET DAILY  90 tablet  2  . hydrochlorothiazide (HYDRODIURIL) 25 MG tablet TAKE 1 TABLET DAILY  90 tablet  2  . mometasone (NASONEX) 50 MCG/ACT nasal spray Place 2 sprays into the nose as needed. Only when flying      . montelukast (SINGULAIR) 10 MG tablet  Take 10 mg by mouth daily as needed. Only when flying      . tadalafil (CIALIS) 20 MG tablet Take 20 mg by mouth daily as needed.        . terazosin (HYTRIN) 5 MG capsule TAKE 1 CAPSULE ONCE DAILY  90 capsule  2    Allergies  Allergen Reactions  . Ace Inhibitors     REACTION: ?angioedema  . Angiotensin Receptor Blockers     REACTION: angioedema on ACE?    Family History  Problem Relation Age of Onset  . Hypertension Mother   . COPD Brother   . Thyroid disease Neg Hx   . Cancer Brother     Throat Cancer    BP 126/82  Pulse 53  Temp(Src) 97.6 F (36.4 C) (Oral)  Ht 6' 3.5" (1.918 m)  Wt 275 lb 12 oz (125.079 kg)  BMI 34.01 kg/m2  SpO2 97%   Review of Systems Pt reports weight gain    Objective:   Physical Exam VITAL SIGNS:  See vs page GENERAL: no distress Neck: there is a 1-2 cm right thyroid nodule.      Assessment & Plan:  S/p i-131 rx, pt may be hypothyroid now.

## 2011-05-10 NOTE — Progress Notes (Signed)
This encounter was created in error - please disregard.

## 2011-05-27 ENCOUNTER — Other Ambulatory Visit: Payer: Self-pay | Admitting: Family Medicine

## 2011-06-01 ENCOUNTER — Encounter: Payer: Self-pay | Admitting: Endocrinology

## 2011-06-01 ENCOUNTER — Ambulatory Visit (INDEPENDENT_AMBULATORY_CARE_PROVIDER_SITE_OTHER): Payer: Medicare Other | Admitting: Endocrinology

## 2011-06-01 ENCOUNTER — Other Ambulatory Visit (INDEPENDENT_AMBULATORY_CARE_PROVIDER_SITE_OTHER): Payer: Medicare Other

## 2011-06-01 ENCOUNTER — Other Ambulatory Visit: Payer: Self-pay | Admitting: Endocrinology

## 2011-06-01 VITALS — BP 118/78 | HR 51 | Temp 97.4°F | Ht 75.5 in | Wt 275.1 lb

## 2011-06-01 DIAGNOSIS — E890 Postprocedural hypothyroidism: Secondary | ICD-10-CM | POA: Diagnosis not present

## 2011-06-01 LAB — TSH: TSH: 6.52 u[IU]/mL — ABNORMAL HIGH (ref 0.35–5.50)

## 2011-06-01 MED ORDER — LEVOTHYROXINE SODIUM 125 MCG PO TABS: 125.0000 ug | ORAL_TABLET | Freq: Every day | ORAL | Status: DC

## 2011-06-01 NOTE — Progress Notes (Signed)
Subjective:    Patient ID: Jason Solomon, male    DOB: 07/19/1940, 71 y.o.   MRN: [id number]  HPI Pt is 4 1/2 months s/p i-131 rx for hyperthyroidism, due to multinodular goiter.  pt states he feels no different, and well in general. He takes synthroid as rx'ed.  Past Medical History  Diagnosis Date  . DIVERTICULOSIS, COLON 01/12/2007  . GOUT 01/12/2007  . HYPERCHOLESTEROLEMIA 06/20/2007  . 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 OBESITY 01/12/2007  . HYPERTENSION 01/12/2007  . PROSTATE SPECIFIC ANTIGEN, ELEVATED 10/08/2009  . HYPERTHYROIDISM 04/24/2010  . GOITER, MULTINODULAR 05/04/2010  . 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 Hernia repair 1990's    right and left inguinal hernia  . Knee surgery 1990's  . Hand surgery 1949    left hand laceration  . Tonsillectomy 1945  . Cardiovascular stress test 2006    negative    History   Social History  . Marital Status: Married    Spouse Name: N/A    Number of Children: 4  . Years of Education: N/A   Occupational History  .      Retired   Social History Main Topics  . Smoking status: Former Smoker    Quit date: 02/23/1988  . Smokeless tobacco: Not on file  . Alcohol Use: Yes     moderate-1 glass of wine daily, occ. scotch  . Drug Use: No  . Sexually Active: Not on file   Other Topics Concern  . Not on file   Social History Narrative   Regular exercise-yes, walks , golfOccupation: Retired Forensic scientist: (+) fruit, (+) veggies, (+) salad, (+) H20Children: 4, healthyMarried x 43 years    Current Outpatient Prescriptions on File Prior to Visit  Medication Sig Dispense Refill  . allopurinol (ZYLOPRIM) 300 MG tablet TAKE 1 TABLET DAILY  90 tablet  1  . hydrochlorothiazide (HYDRODIURIL) 25 MG tablet TAKE 1 TABLET DAILY  90 tablet  2  . levothyroxine (SYNTHROID, LEVOTHROID) 100 MCG tablet Take 1 tablet (100 mcg total) by mouth daily.  30 tablet  2  .  mometasone (NASONEX) 50 MCG/ACT nasal spray Place 2 sprays into the nose as needed. Only when flying      . montelukast (SINGULAIR) 10 MG tablet Take 10 mg by mouth daily as needed. Only when flying      . tadalafil (CIALIS) 20 MG tablet Take 20 mg by mouth daily as needed.        . terazosin (HYTRIN) 5 MG capsule TAKE 1 CAPSULE ONCE DAILY  90 capsule  1    Allergies  Allergen Reactions  . Ace Inhibitors     REACTION: ?angioedema  . Angiotensin Receptor Blockers     REACTION: angioedema on ACE?    Family History  Problem Relation Age of Onset  . Hypertension Mother   . COPD Brother   . Thyroid disease Neg Hx   . Cancer Brother     Throat Cancer    BP 118/78  Pulse 51  Temp(Src) 97.4 F (36.3 C) (Oral)  Ht 6' 3.5" (1.918 m)  Wt 275 lb 2 oz (124.796 kg)  BMI 33.93 kg/m2  SpO2 97%   Review of Systems He has gained a few lbs    Objective:   Physical Exam VITAL SIGNS:  See vs page GENERAL: no distress Neck: there is a 1-2 cm right thyroid nodule.  Assessment & Plan:  Post-i-131 hypothyroidism, on synthroid Multinodular goiter, unchanged on exam

## 2011-06-01 NOTE — Patient Instructions (Addendum)
blood tests are being requested for you today.  You will receive a letter with results. Please come back for a follow-up appointment in 2-3 months. 

## 2011-06-02 ENCOUNTER — Telehealth: Payer: Self-pay | Admitting: *Deleted

## 2011-06-02 NOTE — Telephone Encounter (Signed)
Called pt to inform of TSH results, left message for pt to callback office. (Letter also mailed to pt)

## 2011-06-03 NOTE — Telephone Encounter (Signed)
Pt informed of lab results. 

## 2011-09-01 DIAGNOSIS — R972 Elevated prostate specific antigen [PSA]: Secondary | ICD-10-CM | POA: Diagnosis not present

## 2011-09-08 DIAGNOSIS — R972 Elevated prostate specific antigen [PSA]: Secondary | ICD-10-CM | POA: Diagnosis not present

## 2011-09-08 DIAGNOSIS — N401 Enlarged prostate with lower urinary tract symptoms: Secondary | ICD-10-CM | POA: Diagnosis not present

## 2011-09-14 ENCOUNTER — Encounter: Payer: Self-pay | Admitting: Endocrinology

## 2011-09-14 ENCOUNTER — Other Ambulatory Visit (INDEPENDENT_AMBULATORY_CARE_PROVIDER_SITE_OTHER): Payer: Medicare Other

## 2011-09-14 ENCOUNTER — Ambulatory Visit (INDEPENDENT_AMBULATORY_CARE_PROVIDER_SITE_OTHER): Payer: Medicare Other | Admitting: Endocrinology

## 2011-09-14 ENCOUNTER — Other Ambulatory Visit: Payer: Self-pay | Admitting: Endocrinology

## 2011-09-14 VITALS — BP 120/70 | HR 50 | Temp 97.4°F | Ht 75.0 in | Wt 266.0 lb

## 2011-09-14 DIAGNOSIS — E890 Postprocedural hypothyroidism: Secondary | ICD-10-CM

## 2011-09-14 LAB — TSH: TSH: 0.04 u[IU]/mL — ABNORMAL LOW (ref 0.35–5.50)

## 2011-09-14 MED ORDER — LEVOTHYROXINE SODIUM 100 MCG PO TABS: 100.0000 ug | ORAL_TABLET | Freq: Every day | ORAL | Status: DC

## 2011-09-14 NOTE — Progress Notes (Signed)
Subjective:    Patient ID: Jason Solomon, male    DOB: 08/04/1940, 71 y.o.   MRN: [id number]  HPI Pt is 8 months s/p i-131 rx for hyperthyroidism, due to multinodular goiter.  pt states he feels no different, and well in general. He takes synthroid as rx'ed.  He does not notice the goiter.  Past Medical History  Diagnosis Date  . DIVERTICULOSIS, COLON 01/12/2007  . GOUT 01/12/2007  . HYPERCHOLESTEROLEMIA 06/20/2007  . 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 OBESITY 01/12/2007  . HYPERTENSION 01/12/2007  . PROSTATE SPECIFIC ANTIGEN, ELEVATED 10/08/2009  . HYPERTHYROIDISM 04/24/2010  . GOITER, MULTINODULAR 05/04/2010  . 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 Hernia repair 1990's    right and left inguinal hernia  . Knee surgery 1990's  . Hand surgery 1949    left hand laceration  . Tonsillectomy 1945  . Cardiovascular stress test 2006    negative    History   Social History  . Marital Status: Married    Spouse Name: N/A    Number of Children: 4  . Years of Education: N/A   Occupational History  .      Retired   Social History Main Topics  . Smoking status: Former Smoker    Quit date: 02/23/1988  . Smokeless tobacco: Not on file  . Alcohol Use: Yes     moderate-1 glass of wine daily, occ. scotch  . Drug Use: No  . Sexually Active: Not on file   Other Topics Concern  . Not on file   Social History Narrative   Regular exercise-yes, walks , golfOccupation: Retired Forensic scientist: (+) fruit, (+) veggies, (+) salad, (+) H20Children: 4, healthyMarried x 43 years    Current Outpatient Prescriptions on File Prior to Visit  Medication Sig Dispense Refill  . allopurinol (ZYLOPRIM) 300 MG tablet TAKE 1 TABLET DAILY  90 tablet  1  . hydrochlorothiazide (HYDRODIURIL) 25 MG tablet TAKE 1 TABLET DAILY  90 tablet  2  . levothyroxine (SYNTHROID, LEVOTHROID) 125 MCG tablet Take 1 tablet (125 mcg total) by  mouth daily.  30 tablet  3  . mometasone (NASONEX) 50 MCG/ACT nasal spray Place 2 sprays into the nose as needed. Only when flying      . montelukast (SINGULAIR) 10 MG tablet Take 10 mg by mouth daily as needed. Only when flying      . tadalafil (CIALIS) 20 MG tablet Take 20 mg by mouth daily as needed.        . terazosin (HYTRIN) 5 MG capsule TAKE 1 CAPSULE ONCE DAILY  90 capsule  1    Allergies  Allergen Reactions  . Ace Inhibitors     REACTION: ?angioedema  . Angiotensin Receptor Blockers     REACTION: angioedema on ACE?    Family History  Problem Relation Age of Onset  . Hypertension Mother   . COPD Brother   . Thyroid disease Neg Hx   . Cancer Brother     Throat Cancer    BP 120/70  Pulse 50  Temp 97.4 F (36.3 C) (Oral)  Ht 6\' 3"  (1.905 m)  Wt 266 lb (120.657 kg)  BMI 33.25 kg/m2  SpO2 95%  Review of Systems Denies significant weight change    Objective:   Physical Exam VITAL SIGNS:  See vs page GENERAL: no distress Neck: 1 cm right thyroid nodule.    Assessment &  Plan:  Post-i-131 hypothyroidism, on synthroid. Multinodular goiter, improved with i-131 rx.

## 2011-09-14 NOTE — Patient Instructions (Addendum)
blood tests are being requested for you today.  You will receive a letter with results.   Please come back for a follow-up appointment in 6 months. 

## 2011-09-15 ENCOUNTER — Telehealth: Payer: Self-pay | Admitting: *Deleted

## 2011-09-15 NOTE — Telephone Encounter (Signed)
Called pt to inform of lab results, pt's spouse informed (letter also mailed to pt).

## 2011-10-07 DIAGNOSIS — H40009 Preglaucoma, unspecified, unspecified eye: Secondary | ICD-10-CM | POA: Diagnosis not present

## 2011-10-28 ENCOUNTER — Other Ambulatory Visit: Payer: Self-pay | Admitting: *Deleted

## 2011-10-28 ENCOUNTER — Other Ambulatory Visit: Payer: Self-pay | Admitting: Endocrinology

## 2011-10-28 ENCOUNTER — Other Ambulatory Visit (INDEPENDENT_AMBULATORY_CARE_PROVIDER_SITE_OTHER): Payer: Medicare Other

## 2011-10-28 DIAGNOSIS — E890 Postprocedural hypothyroidism: Secondary | ICD-10-CM | POA: Diagnosis not present

## 2011-10-28 LAB — TSH: TSH: 0.08 u[IU]/mL — ABNORMAL LOW (ref 0.35–5.50)

## 2011-10-28 MED ORDER — LEVOTHYROXINE SODIUM 50 MCG PO TABS: 50.0000 ug | ORAL_TABLET | Freq: Every day | ORAL | Status: DC

## 2011-10-31 ENCOUNTER — Other Ambulatory Visit: Payer: Self-pay | Admitting: Family Medicine

## 2011-11-25 ENCOUNTER — Ambulatory Visit (INDEPENDENT_AMBULATORY_CARE_PROVIDER_SITE_OTHER): Payer: Medicare Other

## 2011-11-25 DIAGNOSIS — Z23 Encounter for immunization: Secondary | ICD-10-CM | POA: Diagnosis not present

## 2011-12-07 ENCOUNTER — Telehealth: Payer: Self-pay | Admitting: Family Medicine

## 2011-12-07 DIAGNOSIS — R972 Elevated prostate specific antigen [PSA]: Secondary | ICD-10-CM

## 2011-12-07 DIAGNOSIS — I10 Essential (primary) hypertension: Secondary | ICD-10-CM

## 2011-12-07 DIAGNOSIS — E890 Postprocedural hypothyroidism: Secondary | ICD-10-CM

## 2011-12-07 DIAGNOSIS — E7800 Pure hypercholesterolemia, unspecified: Secondary | ICD-10-CM

## 2011-12-07 DIAGNOSIS — M109 Gout, unspecified: Secondary | ICD-10-CM

## 2011-12-07 NOTE — Telephone Encounter (Signed)
Message copied by Jinny Sanders on Tue Dec 07, 2011  4:59 PM ------      Message from: Ellamae Sia      Created: Thu Dec 02, 2011 11:46 AM      Regarding: Lab orders for Thursday 10.17.13       Patient is scheduled for CPX labs, please order future labs, Thanks , Karna Christmas

## 2011-12-09 ENCOUNTER — Other Ambulatory Visit (INDEPENDENT_AMBULATORY_CARE_PROVIDER_SITE_OTHER): Payer: Medicare Other

## 2011-12-09 DIAGNOSIS — E7800 Pure hypercholesterolemia, unspecified: Secondary | ICD-10-CM

## 2011-12-09 DIAGNOSIS — M109 Gout, unspecified: Secondary | ICD-10-CM | POA: Diagnosis not present

## 2011-12-09 LAB — COMPREHENSIVE METABOLIC PANEL
ALT: 16 U/L (ref 0–53)
Alkaline Phosphatase: 41 U/L (ref 39–117)
Sodium: 140 mEq/L (ref 135–145)
Total Bilirubin: 1.2 mg/dL (ref 0.3–1.2)
Total Protein: 6.6 g/dL (ref 6.0–8.3)

## 2011-12-09 LAB — LIPID PANEL
LDL Cholesterol: 99 mg/dL (ref 0–99)
VLDL: 15.6 mg/dL (ref 0.0–40.0)

## 2011-12-09 LAB — URIC ACID: Uric Acid, Serum: 5.5 mg/dL (ref 4.0–7.8)

## 2011-12-16 ENCOUNTER — Encounter: Payer: Self-pay | Admitting: Family Medicine

## 2011-12-16 ENCOUNTER — Ambulatory Visit (INDEPENDENT_AMBULATORY_CARE_PROVIDER_SITE_OTHER): Payer: Medicare Other | Admitting: Family Medicine

## 2011-12-16 VITALS — BP 128/78 | HR 62 | Temp 97.7°F | Ht 73.0 in | Wt 263.0 lb

## 2011-12-16 DIAGNOSIS — M25579 Pain in unspecified ankle and joints of unspecified foot: Secondary | ICD-10-CM

## 2011-12-16 DIAGNOSIS — M25571 Pain in right ankle and joints of right foot: Secondary | ICD-10-CM | POA: Insufficient documentation

## 2011-12-16 DIAGNOSIS — Z0000 Encounter for general adult medical examination without abnormal findings: Secondary | ICD-10-CM

## 2011-12-16 DIAGNOSIS — J309 Allergic rhinitis, unspecified: Secondary | ICD-10-CM

## 2011-12-16 DIAGNOSIS — E7800 Pure hypercholesterolemia, unspecified: Secondary | ICD-10-CM | POA: Diagnosis not present

## 2011-12-16 DIAGNOSIS — I10 Essential (primary) hypertension: Secondary | ICD-10-CM

## 2011-12-16 DIAGNOSIS — R972 Elevated prostate specific antigen [PSA]: Secondary | ICD-10-CM

## 2011-12-16 DIAGNOSIS — E042 Nontoxic multinodular goiter: Secondary | ICD-10-CM

## 2011-12-16 NOTE — Assessment & Plan Note (Signed)
Well controlled. Continue current medication.  

## 2011-12-16 NOTE — Patient Instructions (Addendum)
Work on decreasing sweets, decrease sugar in coffee, avoid juice. Continue great work on exercise.  Ankles strengthening. Can use ibuprofen prn pain. Elevate ankle above heart when sitting, can use ice.  Nasal saline irrigation for likely nasal allergy symptoms. If not improving with season call to try nasal steroid.

## 2011-12-16 NOTE — Assessment & Plan Note (Signed)
Foot pronation and irritation of medial ankle tendons. Recommend ice, ROM exercises and strengthening (info given) NSAIDs if needed. Follow up if not improving as expected in 2 weeks.

## 2011-12-16 NOTE — Progress Notes (Signed)
HPI  I have personally reviewed the Medicare Annual Wellness questionnaire and have noted  1. The patient's medical and social history  2. Their use of alcohol, tobacco or illicit drugs  3. Their current medications and supplements  4. The patient's functional ability including ADL's, fall risks, home safety risks and hearing or visual  impairment.  5. Diet and physical activities  6. Evidence for depression or mood disorders  The patients weight, height, BMI and visual acuity have been recorded in the chart  I have made referrals, counseling and provided education to the patient based review of the above and I have provided the pt with a written personalized care plan for preventive services.   Hypertension: Well controlled on HCTZ  Using medication without problems or lightheadedness:  Chest pain with exertion: None  Edema:None  Short of breath:Occ  Average home BPs: At home   Elevated Cholesterol:Well controlled on diet.  Lab Results  Component Value Date   CHOL 166 12/09/2011   HDL 51.60 12/09/2011   LDLCALC 99 12/09/2011   TRIG 78.0 12/09/2011   CHOLHDL 3 12/09/2011   Working on portion control, some snacking.  Lost 12 lbs in last 6 months. Wt Readings from Last 3 Encounters:  12/16/11 263 lb (119.296 kg)  09/14/11 266 lb (120.657 kg)  06/01/11 275 lb 2 oz (124.796 kg)  Goes to gym 2 times a week, treadmill, weights.   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 Followed by Dr. Loanne Drilling.  Stopped levothyroxine because last TSH too high.   Lab Results  Component Value Date   TSH 0.08* 10/28/2011  Has follow up in 02/2011   Elevated PSA: Followed by Dr. Janice Norrie, last OV 10/2011.ild nocturia but only once a night. On hytrin, stopped avodart.  No current urinary issue, no flow issue, does urinate 3 times at night.  ED: used cialis in past... Never tried a whole, 1/2 tablet did not help.  No problems with sex drive.  Stopped avodart.. May have had some SE to this.   Gout: On  allopurinol, uric acid well controlled. No recent gout flares or new stones.   New diagnosis of prediabetes: blood sugar slightly elevated.  Review of Systems  Constitutional: Negative for fever, fatigue and unexpected weight change.  HENT: Negative for ear pain, sore throat, rhinorrhea, trouble swallowing and has noted phlegm in troat, clearing throat at noght and in morning off and on in last year, more persistant in last 2 months. No sneeze, no itchy eyes.  Eyes: Negative for pain.  Respiratory: Negative for cough, shortness of breath and wheezing.  Cardiovascular: Negative for chest pain, palpitations and leg swelling.  Gastrointestinal: Negative for nausea, abdominal pain, diarrhea, constipation and blood in stool.  Genitourinary: Negative for dysuria, urgency, hematuria, discharge, penile swelling, scrotal swelling, difficulty urinating, penile pain and testicular pain.  ED  Skin: Negative for rash.  Neurological: Negative for syncope, weakness, light-headedness, numbness and headaches.  Psychiatric/Behavioral: Negative for behavioral problems and dysphoric mood. The patient is not nervous/anxious.  MSK: sore and stiff in right ankle after playing golf. Some swelling and pain medically , no injury. No changes in shoes or activity.   Objective:   Physical Exam  Constitutional: He appears well-developed and well-nourished. Non-toxic appearance. He does not appear ill. No distress.  HENT:  Head: Normocephalic and atraumatic.  Right Ear: Hearing, tympanic membrane, external ear and ear canal normal.  Left Ear: Hearing, tympanic membrane, external ear and ear canal normal.  Nose: Nasal turbinate swelling ,  pale. Mouth/Throat: Uvula is midline, oropharynx is clear and moist and mucous membranes are normal.  Eyes: Conjunctivae, EOM and lids are normal. Pupils are equal, round, and reactive to light. No foreign bodies found.  Neck: Trachea normal, normal range of motion and phonation  normal. Neck supple. Carotid bruit is not present. No mass and no thyromegaly present.  Cardiovascular: Normal rate, regular rhythm, S1 normal, S2 normal, intact distal pulses and normal pulses. Exam reveals no gallop.  No murmur heard.  Pulmonary/Chest: Breath sounds normal. He has no wheezes. He has no rhonchi. He has no rales.  Abdominal: Soft. Normal appearance and bowel sounds are normal. There is no hepatosplenomegaly. There is no tenderness. There is no rebound, no guarding and no CVA tenderness. No hernia.  Lymphadenopathy:  He has no cervical adenopathy.  Neurological: He is alert. He has normal strength and normal reflexes. No cranial nerve deficit or sensory deficit. Gait normal.  Skin: Skin is warm, dry and intact. No rash noted.  Psychiatric: He has a normal mood and affect. His speech is normal and behavior is normal. Judgment normal.   MSK: ttp over medial ankle superior and [posterior to medial malleolus, slight swelling Pt does have B varicose veins and 1 plus edema in ankles Assessment & Plan:   Annual Medicare Wellness Exam The patient's preventative maintenance and recommended screening tests for an annual wellness exam were reviewed in full today.  Brought up to date unless services declined.  Counselled on the importance of diet, exercise, and its role in overall health and mortality.  The patient's FH and SH was reviewed, including their home life, tobacco status, and drug and alcohol status.   Last colonoscopy in 2007.Marland Kitchen Repeat in 10 years.  PSA, prostate per Dr. Janice Norrie.  Discussed shingles, Up to Date with PNA vaccine and Tdap.

## 2011-12-16 NOTE — Assessment & Plan Note (Signed)
Well controlled on no med. Encouraged exercise, weight loss, healthy eating habits.   

## 2011-12-16 NOTE — Assessment & Plan Note (Signed)
Per Dr. Ellison 

## 2011-12-16 NOTE — Assessment & Plan Note (Signed)
Nasal saline irrigation for likely nasal allergy symptoms. If not improving with season call to try nasal steroid.

## 2011-12-27 ENCOUNTER — Other Ambulatory Visit: Payer: Self-pay | Admitting: Family Medicine

## 2012-01-29 ENCOUNTER — Other Ambulatory Visit: Payer: Self-pay | Admitting: Family Medicine

## 2012-02-25 ENCOUNTER — Telehealth: Payer: Self-pay | Admitting: Endocrinology

## 2012-02-25 MED ORDER — LEVOTHYROXINE SODIUM 50 MCG PO TABS: 50.0000 ug | ORAL_TABLET | Freq: Every day | ORAL | Status: DC

## 2012-02-25 NOTE — Telephone Encounter (Signed)
The patient called back to report that he needs 2-3 wks worth of Levothyroxine to make it until his appt with Dr. Loanne Drilling in a couple of weeks. The patient uses the [facility] on [address] in [place].

## 2012-02-25 NOTE — Telephone Encounter (Signed)
The patient left message on reception voicemail that he needed to speak with Dr. Cordelia Pen nurse concerning one of his medications. He did not give name of medication.  Please call patient at [phone].

## 2012-02-29 ENCOUNTER — Telehealth: Payer: Self-pay | Admitting: Endocrinology

## 2012-02-29 MED ORDER — LEVOTHYROXINE SODIUM 50 MCG PO TABS: 50.0000 ug | ORAL_TABLET | Freq: Every day | ORAL | Status: DC

## 2012-02-29 NOTE — Telephone Encounter (Signed)
The patient has called for a third time regarding his Levothyroxine refill.  Please review.  The patient uses the [facility] on [address] in [place].

## 2012-03-08 DIAGNOSIS — R972 Elevated prostate specific antigen [PSA]: Secondary | ICD-10-CM | POA: Diagnosis not present

## 2012-03-08 DIAGNOSIS — Z0000 Encounter for general adult medical examination without abnormal findings: Secondary | ICD-10-CM | POA: Diagnosis not present

## 2012-03-14 ENCOUNTER — Ambulatory Visit (INDEPENDENT_AMBULATORY_CARE_PROVIDER_SITE_OTHER): Payer: Medicare Other | Admitting: Endocrinology

## 2012-03-14 VITALS — BP 140/76 | HR 61 | Temp 98.5°F | Wt 272.0 lb

## 2012-03-14 DIAGNOSIS — E890 Postprocedural hypothyroidism: Secondary | ICD-10-CM

## 2012-03-14 LAB — TSH: TSH: 9.73 u[IU]/mL — ABNORMAL HIGH (ref 0.35–5.50)

## 2012-03-14 NOTE — Patient Instructions (Addendum)
blood tests are being requested for you today.  We'll contact you with results. Please come back for a follow-up appointment in 3 months

## 2012-03-14 NOTE — Progress Notes (Signed)
Subjective:    Patient ID: Jason Solomon, male    DOB: June 12, 1940, 72 y.o.   MRN: [id number]  HPI Pt had i-131 rx for hyperthyroidism, due to multinodular goiter, in late 2012.  pt states he feels no different, and well in general. 4 mos ago, synthroid was reduced to 50/d, due to suppressed TSH.  He takes synthroid as rx'ed.  He does not notice the goiter. Past Medical History  Diagnosis Date  . DIVERTICULOSIS, COLON 01/12/2007  . GOUT 01/12/2007  . HYPERCHOLESTEROLEMIA 06/20/2007  . 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 OBESITY 01/12/2007  . HYPERTENSION 01/12/2007  . PROSTATE SPECIFIC ANTIGEN, ELEVATED 10/08/2009  . HYPERTHYROIDISM 04/24/2010  . GOITER, MULTINODULAR 05/04/2010  . 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 Hernia repair 1990's    right and left inguinal hernia  . Knee surgery 1990's  . Hand surgery 1949    left hand laceration  . Tonsillectomy 1945  . Cardiovascular stress test 2006    negative    History   Social History  . Marital Status: Married    Spouse Name: N/A    Number of Children: 4  . Years of Education: N/A   Occupational History  .      Retired   Social History Main Topics  . Smoking status: Former Smoker    Quit date: 02/23/1988  . Smokeless tobacco: Not on file  . Alcohol Use: Yes     Comment: moderate-1 glass of wine daily, occ. scotch  . Drug Use: No  . Sexually Active: Not on file   Other Topics Concern  . Not on file   Social History Narrative   Regular exercise-yes, walks , golfOccupation: Retired Forensic scientist: (+) fruit, (+) veggies, (+) salad, (+) H20Children: 4, healthyMarried x 43 yearsLiving will, [place], daughter. Full code. (reviewed 2013)    Current Outpatient Prescriptions on File Prior to Visit  Medication Sig Dispense Refill  . allopurinol (ZYLOPRIM) 300 MG tablet TAKE 1 TABLET DAILY  90 tablet  2  . hydrochlorothiazide  (HYDRODIURIL) 25 MG tablet TAKE 1 TABLET DAILY  90 tablet  1  . levothyroxine (SYNTHROID, LEVOTHROID) 50 MCG tablet Take 1 tablet (50 mcg total) by mouth daily.  30 tablet  3  . mometasone (NASONEX) 50 MCG/ACT nasal spray Place 2 sprays into the nose as needed. Only when flying      . montelukast (SINGULAIR) 10 MG tablet Take 10 mg by mouth daily as needed. Only when flying      . tadalafil (CIALIS) 20 MG tablet Take 20 mg by mouth daily as needed.        . terazosin (HYTRIN) 5 MG capsule TAKE 1 CAPSULE ONCE DAILY  90 capsule  2    Allergies  Allergen Reactions  . Ace Inhibitors     REACTION: ?angioedema  . Angiotensin Receptor Blockers     REACTION: angioedema on ACE?    Family History  Problem Relation Age of Onset  . Hypertension Mother   . COPD Brother   . Cancer Brother 40    lung cancer  . Thyroid disease Neg Hx   . Cancer Brother     Throat Cancer    BP 140/76  Pulse 61  Temp 98.5 F (36.9 C) (Oral)  Wt 272 lb (123.378 kg)  SpO2 96% Review of Systems He reports weight gain    Objective:  Physical Exam VITAL SIGNS:  See vs page GENERAL: no distress Neck: 1 cm right thyroid nodule.      Assessment & Plan:  Post-i-131 hypothyroidism.  He may be able to d/c synthroid altogether

## 2012-03-15 DIAGNOSIS — N401 Enlarged prostate with lower urinary tract symptoms: Secondary | ICD-10-CM | POA: Diagnosis not present

## 2012-03-15 DIAGNOSIS — R972 Elevated prostate specific antigen [PSA]: Secondary | ICD-10-CM | POA: Diagnosis not present

## 2012-05-03 ENCOUNTER — Telehealth: Payer: Self-pay | Admitting: Endocrinology

## 2012-05-03 MED ORDER — LEVOTHYROXINE SODIUM 50 MCG PO TABS: 50.0000 ug | ORAL_TABLET | Freq: Every day | ORAL | Status: DC

## 2012-05-03 NOTE — Telephone Encounter (Signed)
needs thyroid meds refill. Needs 90 day supply for mail in script. Call patient in the afternoon.  / Sherri S.

## 2012-05-05 ENCOUNTER — Other Ambulatory Visit: Payer: Self-pay

## 2012-05-05 MED ORDER — LEVOTHYROXINE SODIUM 50 MCG PO TABS: 50.0000 ug | ORAL_TABLET | Freq: Every day | ORAL | Status: DC

## 2012-06-21 ENCOUNTER — Ambulatory Visit (INDEPENDENT_AMBULATORY_CARE_PROVIDER_SITE_OTHER): Payer: Medicare Other | Admitting: Endocrinology

## 2012-06-21 ENCOUNTER — Encounter: Payer: Self-pay | Admitting: Endocrinology

## 2012-06-21 VITALS — BP 120/74 | HR 52 | Wt 270.0 lb

## 2012-06-21 DIAGNOSIS — E890 Postprocedural hypothyroidism: Secondary | ICD-10-CM | POA: Diagnosis not present

## 2012-06-21 NOTE — Progress Notes (Signed)
Subjective:    Patient ID: Jason Solomon, male    DOB: 1940-12-18, 72 y.o.   MRN: [id number]  HPI Pt had i-131 rx for hyperthyroidism, due to multinodular goiter, in late 2012.  pt states he feels no different, and well in general.  Her has had a fluctuating requirement for synthroid, which he takes as rx'ed.  He does not notice the goiter.  Past Medical History  Diagnosis Date  . DIVERTICULOSIS, COLON 01/12/2007  . GOUT 01/12/2007  . HYPERCHOLESTEROLEMIA 06/20/2007  . 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 OBESITY 01/12/2007  . HYPERTENSION 01/12/2007  . PROSTATE SPECIFIC ANTIGEN, ELEVATED 10/08/2009  . HYPERTHYROIDISM 04/24/2010  . GOITER, MULTINODULAR 05/04/2010  . 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 Hernia repair  1990's    right and left inguinal hernia  . Knee surgery  1990's  . Hand surgery  1949    left hand laceration  . Tonsillectomy  1945  . Cardiovascular stress test  2006    negative    History   Social History  . Marital Status: Married    Spouse Name: N/A    Number of Children: 4  . Years of Education: N/A   Occupational History  .      Retired   Social History Main Topics  . Smoking status: Former Smoker    Quit date: 02/23/1988  . Smokeless tobacco: Not on file  . Alcohol Use: Yes     Comment: moderate-1 glass of wine daily, occ. scotch  . Drug Use: No  . Sexually Active: Not on file   Other Topics Concern  . Not on file   Social History Narrative   Regular exercise-yes, walks , golf   Occupation: Retired Hydrographic surveyor   Diet: (+) fruit, (+) veggies, (+) salad, (+) H20   Children: 4, healthy   Married x 43 years   Living will, [place], daughter. Full code. (reviewed 2013)    Current Outpatient Prescriptions on File Prior to Visit  Medication Sig Dispense Refill  . allopurinol (ZYLOPRIM) 300 MG tablet TAKE 1 TABLET DAILY  90 tablet  2  .  hydrochlorothiazide (HYDRODIURIL) 25 MG tablet TAKE 1 TABLET DAILY  90 tablet  1  . levothyroxine (SYNTHROID, LEVOTHROID) 50 MCG tablet Take 1 tablet (50 mcg total) by mouth daily.  90 tablet  3  . mometasone (NASONEX) 50 MCG/ACT nasal spray Place 2 sprays into the nose as needed. Only when flying      . montelukast (SINGULAIR) 10 MG tablet Take 10 mg by mouth daily as needed. Only when flying      . tadalafil (CIALIS) 20 MG tablet Take 20 mg by mouth daily as needed.        . terazosin (HYTRIN) 5 MG capsule TAKE 1 CAPSULE ONCE DAILY  90 capsule  2   No current facility-administered medications on file prior to visit.    Allergies  Allergen Reactions  . Ace Inhibitors     REACTION: ?angioedema  . Angiotensin Receptor Blockers     REACTION: angioedema on ACE?    Family History  Problem Relation Age of Onset  . Hypertension Mother   . COPD Brother   . Cancer Brother 40    lung cancer  . Thyroid disease Neg Hx   . Cancer Brother     Throat Cancer    BP 120/74  Pulse  52  Wt 270 lb (122.471 kg)  BMI 35.63 kg/m2  SpO2 99%  Review of Systems Denies weight change    Objective:   Physical Exam VITAL SIGNS:  See vs page GENERAL: no distress Neck: ? Slightly enlargement of the right lobe, but i cannot feel a nodule.       Assessment & Plan:  Post-i-131 hypothyroidism, with fluctuating synthroid requirement.  Plan will be to minimize dosage changes.

## 2012-06-21 NOTE — Patient Instructions (Addendum)
blood tests are being requested for you today.  We'll contact you with results. Please come back for a follow-up appointment in 3 months.   most of the time, a "lumpy thyroid" will eventually become overactive again.  this is usually a slow process, happening over the span of many years.

## 2012-07-24 ENCOUNTER — Other Ambulatory Visit: Payer: Self-pay | Admitting: Family Medicine

## 2012-09-05 DIAGNOSIS — R972 Elevated prostate specific antigen [PSA]: Secondary | ICD-10-CM | POA: Diagnosis not present

## 2012-09-13 DIAGNOSIS — R972 Elevated prostate specific antigen [PSA]: Secondary | ICD-10-CM | POA: Diagnosis not present

## 2012-09-13 DIAGNOSIS — R3129 Other microscopic hematuria: Secondary | ICD-10-CM | POA: Diagnosis not present

## 2012-09-13 DIAGNOSIS — N401 Enlarged prostate with lower urinary tract symptoms: Secondary | ICD-10-CM | POA: Diagnosis not present

## 2012-10-04 ENCOUNTER — Encounter: Payer: Self-pay | Admitting: Endocrinology

## 2012-10-04 ENCOUNTER — Ambulatory Visit (INDEPENDENT_AMBULATORY_CARE_PROVIDER_SITE_OTHER): Payer: Medicare Other | Admitting: Endocrinology

## 2012-10-04 VITALS — BP 130/80 | HR 78 | Ht 75.0 in | Wt 265.0 lb

## 2012-10-04 DIAGNOSIS — E890 Postprocedural hypothyroidism: Secondary | ICD-10-CM | POA: Diagnosis not present

## 2012-10-04 DIAGNOSIS — E042 Nontoxic multinodular goiter: Secondary | ICD-10-CM | POA: Diagnosis not present

## 2012-10-04 MED ORDER — LEVOTHYROXINE SODIUM 75 MCG PO TABS: 75.0000 ug | ORAL_TABLET | Freq: Every day | ORAL | Status: DC

## 2012-10-04 NOTE — Patient Instructions (Addendum)
blood tests are being requested for you today.  We'll contact you with results.   Let's also recheck the ultrasound.  you will receive a phone call, about a day and time for an appointment.   Please come back for a follow-up appointment in 6 months.   most of the time, a "lumpy thyroid" will eventually become overactive again.  this is usually a slow process, happening over the span of many years.

## 2012-10-04 NOTE — Progress Notes (Signed)
Subjective:    Patient ID: Jason Solomon, male    DOB: [birth year], 72 y.o.   MRN: [id number]  HPI Pt had i-131 rx for hyperthyroidism, due to multinodular goiter, in late 2012.  pt states he feels no different, and well in general.  Her has had a fluctuating requirement for synthroid, which he says he takes as rx'ed.  He does not notice the goiter.  He has slight mucous sensation in the throat, and assoc nasal congestion.   Past Medical History  Diagnosis Date  . DIVERTICULOSIS, COLON 01/12/2007  . GOUT 01/12/2007  . HYPERCHOLESTEROLEMIA 06/20/2007  . 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 OBESITY 01/12/2007  . HYPERTENSION 01/12/2007  . PROSTATE SPECIFIC ANTIGEN, ELEVATED 10/08/2009  . HYPERTHYROIDISM 04/24/2010  . GOITER, MULTINODULAR 05/04/2010  . 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 Hernia repair  1990's    right and left inguinal hernia  . Knee surgery  1990's  . Hand surgery  1949    left hand laceration  . Tonsillectomy  1945  . Cardiovascular stress test  2006    negative    History   Social History  . Marital Status: Married    Spouse Name: N/A    Number of Children: 4  . Years of Education: N/A   Occupational History  .      Retired   Social History Main Topics  . Smoking status: Former Smoker    Quit date: 02/23/1988  . Smokeless tobacco: Not on file  . Alcohol Use: Yes     Comment: moderate-1 glass of wine daily, occ. scotch  . Drug Use: No  . Sexual Activity: Not on file   Other Topics Concern  . Not on file   Social History Narrative   Regular exercise-yes, walks , golf   Occupation: Retired Hydrographic surveyor   Diet: (+) fruit, (+) veggies, (+) salad, (+) H20   Children: 4, healthy   Married x 43 years   Living will, [place], daughter. Full code. (reviewed 2013)    Current Outpatient Prescriptions on File Prior to Visit  Medication Sig Dispense  Refill  . allopurinol (ZYLOPRIM) 300 MG tablet TAKE 1 TABLET DAILY  90 tablet  2  . hydrochlorothiazide (HYDRODIURIL) 25 MG tablet TAKE 1 TABLET DAILY  90 tablet  0  . mometasone (NASONEX) 50 MCG/ACT nasal spray Place 2 sprays into the nose as needed. Only when flying      . montelukast (SINGULAIR) 10 MG tablet Take 10 mg by mouth daily as needed. Only when flying      . tadalafil (CIALIS) 20 MG tablet Take 20 mg by mouth daily as needed.        . terazosin (HYTRIN) 5 MG capsule TAKE 1 CAPSULE ONCE DAILY  90 capsule  2   No current facility-administered medications on file prior to visit.    Allergies  Allergen Reactions  . Ace Inhibitors     REACTION: ?angioedema  . Angiotensin Receptor Blockers     REACTION: angioedema on ACE?    Family History  Problem Relation Age of Onset  . Hypertension Mother   . COPD Brother   . Cancer Brother 40    lung cancer  . Thyroid disease Neg Hx   . Cancer Brother     Throat Cancer   BP 130/80  Pulse 78  Ht 6\' 3"  (1.905 m)  Wt 265 lb (120.203 kg)  BMI 33.12 kg/m2  SpO2 98%  Review of Systems Denies neck pain and weight change    Objective:   Physical Exam VITAL SIGNS:  See vs page GENERAL: no distress NECK: There is no palpable thyroid enlargement.  No thyroid nodule is palpable.  No palpable lymphadenopathy at the anterior neck.    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

## 2012-10-10 ENCOUNTER — Other Ambulatory Visit: Payer: Medicare Other

## 2012-10-11 ENCOUNTER — Ambulatory Visit
Admission: RE | Admit: 2012-10-11 | Discharge: 2012-10-11 | Disposition: A | Discharge status: Home / Self Care | Payer: Medicare Other | Source: Ambulatory Visit | Attending: Endocrinology | Admitting: Endocrinology

## 2012-10-11 DIAGNOSIS — E041 Nontoxic single thyroid nodule: Secondary | ICD-10-CM | POA: Diagnosis not present

## 2012-10-16 ENCOUNTER — Other Ambulatory Visit: Payer: Self-pay | Admitting: Family Medicine

## 2012-10-16 DIAGNOSIS — R3129 Other microscopic hematuria: Secondary | ICD-10-CM | POA: Diagnosis not present

## 2012-10-31 ENCOUNTER — Ambulatory Visit (INDEPENDENT_AMBULATORY_CARE_PROVIDER_SITE_OTHER): Payer: Medicare Other | Admitting: Family Medicine

## 2012-10-31 DIAGNOSIS — Z23 Encounter for immunization: Secondary | ICD-10-CM | POA: Diagnosis not present

## 2012-11-14 ENCOUNTER — Telehealth: Payer: Self-pay | Admitting: *Deleted

## 2012-11-14 MED ORDER — ZOSTER VACCINE LIVE 19400 UNT/0.65ML ~~LOC~~ SOLR: 0.6500 mL | Freq: Once | SUBCUTANEOUS | Status: DC

## 2012-11-14 NOTE — Telephone Encounter (Signed)
In outbox

## 2012-11-14 NOTE — Telephone Encounter (Signed)
Mrs. Jason Solomon left voicemail stating she had checked with her insurance and the Shingles vaccine would be covered under their prescription plan. Requesting appointment to have this done.  Advised Mrs. Clark that if it was going to be covered by her prescription plan,  She and her husband will need to have this done at a pharmacy.  Will send prescription in to [facility].

## 2012-11-15 NOTE — Telephone Encounter (Signed)
Order was sent to [facility] electronically.

## 2012-11-16 ENCOUNTER — Other Ambulatory Visit: Payer: Self-pay | Admitting: *Deleted

## 2012-11-16 MED ORDER — ZOSTER VACCINE LIVE 19400 UNT/0.65ML ~~LOC~~ SOLR: 0.6500 mL | Freq: Once | SUBCUTANEOUS | Status: DC

## 2012-12-15 ENCOUNTER — Other Ambulatory Visit (INDEPENDENT_AMBULATORY_CARE_PROVIDER_SITE_OTHER): Payer: Medicare Other

## 2012-12-15 ENCOUNTER — Telehealth: Payer: Self-pay | Admitting: Family Medicine

## 2012-12-15 DIAGNOSIS — E7800 Pure hypercholesterolemia, unspecified: Secondary | ICD-10-CM

## 2012-12-15 DIAGNOSIS — M109 Gout, unspecified: Secondary | ICD-10-CM

## 2012-12-15 LAB — LIPID PANEL
Cholesterol: 175 mg/dL (ref 0–200)
LDL Cholesterol: 96 mg/dL (ref 0–99)
Total CHOL/HDL Ratio: 3
VLDL: 29.2 mg/dL (ref 0.0–40.0)

## 2012-12-15 LAB — COMPREHENSIVE METABOLIC PANEL
ALT: 13 U/L (ref 0–53)
BUN: 17 mg/dL (ref 6–23)
CO2: 32 mEq/L (ref 19–32)
Calcium: 9.3 mg/dL (ref 8.4–10.5)
Creatinine, Ser: 1.1 mg/dL (ref 0.4–1.5)
GFR: 86.42 mL/min (ref >60.00)
Total Bilirubin: 0.7 mg/dL (ref 0.3–1.2)

## 2012-12-15 NOTE — Telephone Encounter (Signed)
Message copied by Jinny Sanders on Fri Dec 15, 2012  8:27 AM ------      Message from: Ellamae Sia      Created: Fri Dec 01, 2012  4:37 PM      Regarding: Lab orders for Friday, 10.24.14       Patient is scheduled for CPX labs, please order future labs, Thanks , Terri       ------

## 2012-12-28 ENCOUNTER — Ambulatory Visit (INDEPENDENT_AMBULATORY_CARE_PROVIDER_SITE_OTHER)
Admission: RE | Admit: 2012-12-28 | Discharge: 2012-12-28 | Disposition: A | Discharge status: Home / Self Care | Payer: Medicare Other | Source: Ambulatory Visit | Attending: Family Medicine | Admitting: Family Medicine

## 2012-12-28 ENCOUNTER — Ambulatory Visit (INDEPENDENT_AMBULATORY_CARE_PROVIDER_SITE_OTHER): Payer: Medicare Other | Admitting: Family Medicine

## 2012-12-28 ENCOUNTER — Encounter: Payer: Self-pay | Admitting: Family Medicine

## 2012-12-28 VITALS — BP 130/86 | HR 52 | Temp 98.4°F | Ht 73.0 in | Wt 261.0 lb

## 2012-12-28 DIAGNOSIS — I10 Essential (primary) hypertension: Secondary | ICD-10-CM

## 2012-12-28 DIAGNOSIS — R059 Cough, unspecified: Secondary | ICD-10-CM

## 2012-12-28 DIAGNOSIS — R05 Cough: Secondary | ICD-10-CM

## 2012-12-28 DIAGNOSIS — Z0000 Encounter for general adult medical examination without abnormal findings: Secondary | ICD-10-CM | POA: Diagnosis not present

## 2012-12-28 DIAGNOSIS — R0602 Shortness of breath: Secondary | ICD-10-CM

## 2012-12-28 DIAGNOSIS — L84 Corns and callosities: Secondary | ICD-10-CM | POA: Diagnosis not present

## 2012-12-28 DIAGNOSIS — E7800 Pure hypercholesterolemia, unspecified: Secondary | ICD-10-CM | POA: Diagnosis not present

## 2012-12-28 DIAGNOSIS — R053 Chronic cough: Secondary | ICD-10-CM

## 2012-12-28 DIAGNOSIS — J449 Chronic obstructive pulmonary disease, unspecified: Secondary | ICD-10-CM | POA: Insufficient documentation

## 2012-12-28 DIAGNOSIS — Z87891 Personal history of nicotine dependence: Secondary | ICD-10-CM

## 2012-12-28 NOTE — Progress Notes (Signed)
HPI  I have personally reviewed the Medicare Annual Wellness questionnaire and have noted  1. The patient's medical and social history  2. Their use of alcohol, tobacco or illicit drugs  3. Their current medications and supplements  4. The patient's functional ability including ADL's, fall risks, home safety risks and hearing or visual  impairment.  5. Diet and physical activities  6. Evidence for depression or mood disorders  The patients weight, height, BMI and visual acuity have been recorded in the chart  I have made referrals, counseling and provided education to the patient based review of the above and I have provided the pt with a written personalized care plan for preventive services.    Continues to have dry cough, has to clear mucus out of throat. Ongoing year round. More frequent during the day. Mild SOB in last few months. No sneezing, some runny nose. No clear post nasal drip. Tried nasal saline.. Did not help much. Occ having food taste in throat, occ relfux, no chest pain. Has hx of smoking 34 years x 2 pack a days.  Quit 24 years ago.  Has noted bump on left lateral foot x 7-8 months.  More tender after on feet with golf for a while. Mildly tender to touch, sore.  Hypertension: Well controlled on HCTZ  BP Readings from Last 3 Encounters:  12/28/12 130/86  10/04/12 130/80  06/21/12 120/74  Using medication without problems or lightheadedness:  Chest pain with exertion: None  Edema:None  Short of breath:mild Average home BPs: At home 130/70s  Elevated Cholesterol:Well controlled on diet.  Lab Results  Component Value Date   CHOL 175 12/15/2012   HDL 50.30 12/15/2012   LDLCALC 96 12/15/2012   TRIG 146.0 12/15/2012   CHOLHDL 3 12/15/2012   Working on portion control, some snacking. Lost 10 more lbs in last 6 months.  Wt Readings from Last 3 Encounters:  12/28/12 261 lb (118.389 kg)  10/04/12 265 lb (120.203 kg)  06/21/12 270 lb (122.471 kg)   Goes to  gym 2 times a week, treadmill, weights, but has not been keeping up with it until recently with wife surgery.  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 Followed by Dr. Loanne Drilling.  Stopped levothyroxine because last TSH too high, but now back on levothyroxine 75 mcg Lab Results  Component Value Date   TSH 12.00* 10/04/2012  Has follow up in 03/2012  Elevated PSA: Followed by Dr. Janice Norrie, next OV 02/2013.ild nocturia but only once a night.  On hytrin, stopped avodart.  No current urinary issue, no flow issue, does urinate 3 times at night.   ED: used cialis in past... Never tried a whole, 1/2 tablet did not help.   No problems with sex drive.  Stopped avodart.. May have had some SE to this.  Gout: On allopurinol, uric acid well controlled. No recent gout flares or new stones.  New diagnosis of prediabetes: blood sugar slightly elevated.  Review of Systems  Constitutional: Negative for fever, fatigue and unexpected weight change.  HENT: Negative for ear pain, sore throat, rhinorrhea, trouble swallowing and has noted phlegm in troat, clearing throat at noght and in morning off and on in last year, more persistant in last 2 months. No sneeze, no itchy eyes.  Eyes: Negative for pain.  Respiratory: Negative for cough, shortness of breath and wheezing.  Cardiovascular: Negative for chest pain, palpitations and leg swelling.  Gastrointestinal: Negative for nausea, abdominal pain, diarrhea, constipation and blood in stool.  Genitourinary: Negative  for dysuria, urgency, hematuria, discharge, penile swelling, scrotal swelling, difficulty urinating, penile pain and testicular pain.  ED  Skin: Negative for rash.  Neurological: Negative for syncope, weakness, light-headedness, numbness and headaches.  Psychiatric/Behavioral: Negative for behavioral problems and dysphoric mood. The patient is not nervous/anxious.  MSK: sore and stiff in right ankle after playing golf. Some swelling and pain medically , no  injury. No changes in shoes or activity.  Objective:   Physical Exam  Constitutional: He appears well-developed and well-nourished. Non-toxic appearance. He does not appear ill. No distress.  HENT:  Head: Normocephalic and atraumatic.  Right Ear: Hearing, tympanic membrane, external ear and ear canal normal.  Left Ear: Hearing, tympanic membrane, external ear and ear canal normal.  Nose: Nasal turbinate swelling , mildly pale.  Mouth/Throat: Uvula is midline, oropharynx is clear and moist and mucous membranes are normal.  Eyes: Conjunctivae, EOM and lids are normal. Pupils are equal, round, and reactive to light. No foreign bodies found.  Neck: Trachea normal, normal range of motion and phonation normal. Neck supple. Carotid bruit is not present. No mass and no thyromegaly present.  Cardiovascular: Normal rate, regular rhythm, S1 normal, S2 normal, intact distal pulses and normal pulses. Exam reveals no gallop.  No murmur heard.  Pulmonary/Chest: Breath sounds normal. He has no wheezes. He has no rhonchi. He has no rales.  Abdominal: Soft. Normal appearance and bowel sounds are normal. There is no hepatosplenomegaly. There is no tenderness. There is no rebound, no guarding and no CVA tenderness. No hernia.  Lymphadenopathy:  He has no cervical adenopathy.  Neurological: He is alert. He has normal strength and normal reflexes. No cranial nerve deficit or sensory deficit. Gait normal.  Skin: Skin is warm, dry and intact. No rash noted.  Psychiatric: He has a normal mood and affect. His speech is normal and behavior is normal. Judgment normal.  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 Callus at left lateral foot over distal MCP joint, mild ttp. Assessment & Plan:   Annual Medicare Wellness Exam The patient's preventative maintenance and recommended screening tests for an annual wellness exam  were reviewed in full today.  Brought up to date unless services declined.  Counselled on the importance of diet, exercise, and its role in overall health and mortality.  The patient's FH and SH was reviewed, including their home life, tobacco status, and drug and alcohol status.   Last colonoscopy in 2007.Marland Kitchen Repeat in 10 years.  PSA, prostate per Dr. Janice Norrie.   Up to Date with PNA vaccine, flu, shingles, and Tdap.  Former smoker>60 pack year history.. Needs CXR and PFTs .Marland Kitchen See above symtpoms.

## 2012-12-28 NOTE — Assessment & Plan Note (Addendum)
Well controlled. Continue lifestyle changes

## 2012-12-28 NOTE — Assessment & Plan Note (Signed)
Well controlled. Continue current medication.  

## 2012-12-28 NOTE — Assessment & Plan Note (Signed)
Use pad to relive pressure.

## 2012-12-28 NOTE — Patient Instructions (Addendum)
Get back on track with exercise, healthy eating and weight loss. We will call you with X-ray results.  If normal chest X-ray... Will treat for reflux as cause of cough.. Start prilosec 2 x 20 mg daily x 4-6 weeks, call if not improving in 2 weeks.

## 2012-12-28 NOTE — Progress Notes (Signed)
Pre-visit discussion using our clinic review tool. No additional management support is needed unless otherwise documented below in the visit note.  

## 2012-12-28 NOTE — Assessment & Plan Note (Addendum)
Given smoking history > 60 pack years remotely.. eval with CXR and PFTS.   PFTs showed mild obstruction.. Will await CXR but will likely start spiriva.

## 2012-12-28 NOTE — Assessment & Plan Note (Signed)
Given smoking history > 60 pack years remotely.. eval with CXR and PFTS.

## 2013-01-07 ENCOUNTER — Other Ambulatory Visit: Payer: Self-pay | Admitting: Family Medicine

## 2013-01-14 ENCOUNTER — Other Ambulatory Visit: Payer: Self-pay | Admitting: Family Medicine

## 2013-01-22 DIAGNOSIS — H2510 Age-related nuclear cataract, unspecified eye: Secondary | ICD-10-CM | POA: Diagnosis not present

## 2013-03-14 DIAGNOSIS — R3129 Other microscopic hematuria: Secondary | ICD-10-CM | POA: Diagnosis not present

## 2013-03-21 DIAGNOSIS — R972 Elevated prostate specific antigen [PSA]: Secondary | ICD-10-CM | POA: Diagnosis not present

## 2013-04-09 ENCOUNTER — Ambulatory Visit: Payer: Medicare Other | Admitting: Endocrinology

## 2013-04-11 ENCOUNTER — Encounter: Payer: Self-pay | Admitting: Family Medicine

## 2013-04-11 ENCOUNTER — Encounter: Payer: Self-pay | Admitting: Endocrinology

## 2013-04-11 ENCOUNTER — Ambulatory Visit: Payer: Medicare Other | Admitting: Endocrinology

## 2013-04-11 ENCOUNTER — Ambulatory Visit (INDEPENDENT_AMBULATORY_CARE_PROVIDER_SITE_OTHER): Payer: Medicare Other | Admitting: Endocrinology

## 2013-04-11 VITALS — BP 130/80 | HR 46 | Temp 98.1°F | Ht 73.0 in | Wt 274.0 lb

## 2013-04-11 DIAGNOSIS — E890 Postprocedural hypothyroidism: Secondary | ICD-10-CM | POA: Diagnosis not present

## 2013-04-11 LAB — TSH: TSH: 4.5 u[IU]/mL (ref 0.35–5.50)

## 2013-04-11 MED ORDER — LEVOTHYROXINE SODIUM 75 MCG PO TABS: 75.0000 ug | ORAL_TABLET | Freq: Every day | ORAL | Status: DC

## 2013-04-11 NOTE — Progress Notes (Signed)
Subjective:    Patient ID: Jason Solomon, male    DOB: 05-08-1940, 73 y.o.   MRN: [id number]  HPI Pt had i-131 rx for hyperthyroidism, due to multinodular goiter, in late 2012.  pt states he feels no different, and well in general.  He has had a fluctuating requirement for synthroid, which he says he takes as rx'ed.  He does not notice the goiter.  F/U US in 2014 showed significant shrinkage of the goiter.   Past Medical History  Diagnosis Date  . DIVERTICULOSIS, COLON 01/12/2007  . GOUT 01/12/2007  . HYPERCHOLESTEROLEMIA 06/20/2007  . 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 OBESITY 01/12/2007  . HYPERTENSION 01/12/2007  . PROSTATE SPECIFIC ANTIGEN, ELEVATED 10/08/2009  . HYPERTHYROIDISM 04/24/2010  . GOITER, MULTINODULAR 05/04/2010  . 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 Hernia repair  1990's    right and left inguinal hernia  . Knee surgery  1990's  . Hand surgery  1949    left hand laceration  . Tonsillectomy  1945  . Cardiovascular stress test  2006    negative    History   Social History  . Marital Status: Married    Spouse Name: N/A    Number of Children: 4  . Years of Education: N/A   Occupational History  .      Retired   Social History Main Topics  . Smoking status: Former Smoker    Quit date: 02/23/1988  . Smokeless tobacco: Never Used  . Alcohol Use: Yes     Comment: moderate-1 glass of wine daily, occ. scotch  . Drug Use: No  . Sexual Activity: Not on file   Other Topics Concern  . Not on file   Social History Narrative   Regular exercise-yes, walks , golf   Occupation: Retired Hydrographic surveyor   Diet: (+) fruit, (+) veggies, (+) salad, (+) H20   Children: 4, healthy   Married x 43 years   Living will, [place], daughter. Full code. (reviewed 2013)    Current Outpatient Prescriptions on File Prior to Visit  Medication Sig Dispense Refill  .  allopurinol (ZYLOPRIM) 300 MG tablet TAKE 1 TABLET DAILY  90 tablet  1  . hydrochlorothiazide (HYDRODIURIL) 25 MG tablet TAKE 1 TABLET DAILY  90 tablet  1  . mometasone (NASONEX) 50 MCG/ACT nasal spray Place 2 sprays into the nose as needed. Only when flying      . montelukast (SINGULAIR) 10 MG tablet Take 10 mg by mouth daily as needed. Only when flying      . tadalafil (CIALIS) 20 MG tablet Take 20 mg by mouth daily as needed.        . terazosin (HYTRIN) 5 MG capsule TAKE 1 CAPSULE DAILY  90 capsule  1  . zoster vaccine live, PF, (ZOSTAVAX) 96295 UNT/0.65ML injection Inject 19,400 Units into the skin once.  1 each  0   No current facility-administered medications on file prior to visit.    Allergies  Allergen Reactions  . Ace Inhibitors     REACTION: ?angioedema  . Angiotensin Receptor Blockers     REACTION: angioedema on ACE?    Family History  Problem Relation Age of Onset  . Hypertension Mother   . COPD Brother   . Cancer Brother 40    lung cancer  . Thyroid disease Neg Hx   . Cancer Brother  Throat Cancer    BP 130/80  Pulse 46  Temp(Src) 98.1 F (36.7 C) (Oral)  Ht 6\' 1"  (1.854 m)  Wt 274 lb (124.286 kg)  BMI 36.16 kg/m2  SpO2 98%  Review of Systems He has gained a few lbs.      Objective:   Physical Exam VITAL SIGNS:  See vs page GENERAL: no distress NECK: There is no palpable thyroid enlargement.  No thyroid nodule is palpable.  No palpable lymphadenopathy at the anterior neck.  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

## 2013-04-11 NOTE — Patient Instructions (Addendum)
blood tests are being requested for you today.  We'll contact you with results.   Please come back for a follow-up appointment in 6 months.   most of the time, a "lumpy thyroid" will eventually become overactive again.  this is usually a slow process, happening over the span of many years.

## 2013-06-08 ENCOUNTER — Encounter: Payer: Self-pay | Admitting: Family Medicine

## 2013-06-08 MED ORDER — MONTELUKAST SODIUM 10 MG PO TABS: 10.0000 mg | ORAL_TABLET | Freq: Every day | ORAL | Status: DC | PRN

## 2013-06-13 ENCOUNTER — Other Ambulatory Visit: Payer: Self-pay | Admitting: Family Medicine

## 2013-06-20 ENCOUNTER — Other Ambulatory Visit: Payer: Self-pay | Admitting: Endocrinology

## 2013-06-20 ENCOUNTER — Other Ambulatory Visit: Payer: Self-pay

## 2013-06-20 ENCOUNTER — Other Ambulatory Visit: Payer: Self-pay | Admitting: Family Medicine

## 2013-06-20 ENCOUNTER — Telehealth: Payer: Self-pay

## 2013-06-20 MED ORDER — LEVOTHYROXINE SODIUM 50 MCG PO TABS: ORAL_TABLET | ORAL | Status: DC

## 2013-06-20 MED ORDER — LEVOTHYROXINE SODIUM 75 MCG PO TABS: 75.0000 ug | ORAL_TABLET | Freq: Every day | ORAL | Status: DC

## 2013-06-20 NOTE — Telephone Encounter (Signed)
Patient called [facility] requesting a call back from Dr. Loanne Drilling office regarding a faxed Rx request, name of medication not mentioned. Thanks

## 2013-06-20 NOTE — Telephone Encounter (Signed)
[facility]

## 2013-07-10 ENCOUNTER — Ambulatory Visit (INDEPENDENT_AMBULATORY_CARE_PROVIDER_SITE_OTHER): Payer: Medicare Other | Admitting: Family Medicine

## 2013-07-10 ENCOUNTER — Encounter: Payer: Self-pay | Admitting: Family Medicine

## 2013-07-10 VITALS — BP 122/84 | HR 56 | Temp 97.5°F | Ht 73.0 in | Wt 266.2 lb

## 2013-07-10 DIAGNOSIS — J309 Allergic rhinitis, unspecified: Secondary | ICD-10-CM | POA: Diagnosis not present

## 2013-07-10 MED ORDER — MOMETASONE FUROATE 50 MCG/ACT NA SUSP: 2.0000 | NASAL | Status: DC | PRN

## 2013-07-10 NOTE — Progress Notes (Signed)
Pre visit review using our clinic review tool, if applicable. No additional management support is needed unless otherwise documented below in the visit note. 

## 2013-07-10 NOTE — Patient Instructions (Addendum)
Start zyrtec at bedtime. Nasal saline spray 3-4 times a day. Mucinex  twice daily. Continue nasonex two sprays daily. If not improving  In 1-2 weeks, call for refill of singuliar.  Call sooner face pain or fever.

## 2013-07-10 NOTE — Assessment & Plan Note (Signed)
Add antihistamine, nasal steroid spray, nasal saline irrigation.  Can cosider restarting singulair if not improving.  No sign of bacterial infection.

## 2013-07-10 NOTE — Progress Notes (Signed)
   Subjective:    Patient ID: Jason Solomon, male    DOB: 1940/09/04, 73 y.o.   MRN: [id number]  HPI   73 year old male with 1-1.5 weeks of congestion. Thick mucus, getting darker. Post nasal drip, and clearing throat.  No SOB, no wheeze. No fever. No face pain. No ear pain. Some left ear fullness. Dizziness intermittent .  Right nostril occluded.   Has tried nasal steroid spray, helped minimally. Mucinex helped minimally.  no other meds used.    Review of Systems  Constitutional: Positive for fatigue. Negative for fever.  HENT: Negative for ear pain and mouth sores.   Eyes: Negative for pain.  Respiratory: Negative for cough and shortness of breath.   Cardiovascular: Negative for chest pain.       Objective:   Physical Exam  Constitutional: Vital signs are normal. He appears well-developed and well-nourished.  Non-toxic appearance. He does not appear ill. No distress.  HENT:  Head: Normocephalic and atraumatic.  Right Ear: Hearing, tympanic membrane, external ear and ear canal normal. No tenderness. No foreign bodies. Tympanic membrane is not retracted and not bulging.  Left Ear: Hearing, tympanic membrane, external ear and ear canal normal. No tenderness. No foreign bodies. Tympanic membrane is not retracted and not bulging.  Nose: Mucosal edema and rhinorrhea present. Right sinus exhibits no maxillary sinus tenderness and no frontal sinus tenderness. Left sinus exhibits no maxillary sinus tenderness and no frontal sinus tenderness.  Mouth/Throat: Uvula is midline, oropharynx is clear and moist and mucous membranes are normal. Normal dentition. No dental caries. No oropharyngeal exudate or tonsillar abscesses.  Eyes: Conjunctivae, EOM and lids are normal. Pupils are equal, round, and reactive to light. Lids are everted and swept, no foreign bodies found.  Neck: Trachea normal, normal range of motion and phonation normal. Neck supple. Carotid bruit is not present. No mass  and no thyromegaly present.  Cardiovascular: Normal rate, regular rhythm, S1 normal, S2 normal, normal heart sounds, intact distal pulses and normal pulses.  Exam reveals no gallop.   No murmur heard. Pulmonary/Chest: Effort normal and breath sounds normal. No respiratory distress. He has no wheezes. He has no rhonchi. He has no rales.  Abdominal: Soft. Normal appearance and bowel sounds are normal. There is no hepatosplenomegaly. There is no tenderness. There is no rebound, no guarding and no CVA tenderness. No hernia.  Neurological: He is alert. He has normal reflexes.  Skin: Skin is warm, dry and intact. No rash noted.  Psychiatric: He has a normal mood and affect. His speech is normal and behavior is normal. Judgment normal.          Assessment & Plan:

## 2013-09-02 ENCOUNTER — Encounter: Payer: Self-pay | Admitting: Family Medicine

## 2013-09-02 MED ORDER — MONTELUKAST SODIUM 10 MG PO TABS: 10.0000 mg | ORAL_TABLET | Freq: Every day | ORAL | Status: DC | PRN

## 2013-09-12 DIAGNOSIS — R972 Elevated prostate specific antigen [PSA]: Secondary | ICD-10-CM | POA: Diagnosis not present

## 2013-09-19 DIAGNOSIS — N139 Obstructive and reflux uropathy, unspecified: Secondary | ICD-10-CM | POA: Diagnosis not present

## 2013-09-19 DIAGNOSIS — N401 Enlarged prostate with lower urinary tract symptoms: Secondary | ICD-10-CM | POA: Diagnosis not present

## 2013-09-19 DIAGNOSIS — R972 Elevated prostate specific antigen [PSA]: Secondary | ICD-10-CM | POA: Diagnosis not present

## 2013-09-26 ENCOUNTER — Other Ambulatory Visit: Payer: Self-pay | Admitting: Endocrinology

## 2013-11-20 ENCOUNTER — Ambulatory Visit (INDEPENDENT_AMBULATORY_CARE_PROVIDER_SITE_OTHER): Payer: Medicare Other

## 2013-11-20 DIAGNOSIS — Z23 Encounter for immunization: Secondary | ICD-10-CM

## 2013-11-30 ENCOUNTER — Other Ambulatory Visit: Payer: Self-pay | Admitting: Family Medicine

## 2013-12-10 ENCOUNTER — Other Ambulatory Visit: Payer: Self-pay | Admitting: Family Medicine

## 2013-12-24 ENCOUNTER — Telehealth: Payer: Self-pay | Admitting: Family Medicine

## 2013-12-24 ENCOUNTER — Other Ambulatory Visit: Payer: Self-pay | Admitting: Endocrinology

## 2013-12-24 DIAGNOSIS — E7800 Pure hypercholesterolemia, unspecified: Secondary | ICD-10-CM

## 2013-12-24 DIAGNOSIS — M1A00X0 Idiopathic chronic gout, unspecified site, without tophus (tophi): Secondary | ICD-10-CM

## 2013-12-24 NOTE — Telephone Encounter (Signed)
-----   Message from Ellamae Sia sent at 12/19/2013 12:11 PM EDT ----- Regarding: Lab orders for Monday, 11.2.15 Patient is scheduled for CPX labs, please order future labs, Thanks , Karna Christmas

## 2013-12-25 ENCOUNTER — Other Ambulatory Visit (INDEPENDENT_AMBULATORY_CARE_PROVIDER_SITE_OTHER): Payer: Medicare Other

## 2013-12-25 DIAGNOSIS — E7800 Pure hypercholesterolemia, unspecified: Secondary | ICD-10-CM

## 2013-12-25 DIAGNOSIS — M1A00X0 Idiopathic chronic gout, unspecified site, without tophus (tophi): Secondary | ICD-10-CM

## 2013-12-25 LAB — COMPREHENSIVE METABOLIC PANEL
ALT: 13 U/L (ref 0–53)
AST: 20 U/L (ref 0–37)
Albumin: 3.3 g/dL — ABNORMAL LOW (ref 3.5–5.2)
Alkaline Phosphatase: 41 U/L (ref 39–117)
BUN: 17 mg/dL (ref 6–23)
CALCIUM: 9.2 mg/dL (ref 8.4–10.5)
CHLORIDE: 102 meq/L (ref 96–112)
CO2: 34 meq/L — AB (ref 19–32)
CREATININE: 1.1 mg/dL (ref 0.4–1.5)
GFR: 84.37 mL/min (ref >60.00)
GLUCOSE: 88 mg/dL (ref 70–99)
Potassium: 3.3 mEq/L — ABNORMAL LOW (ref 3.5–5.1)
Sodium: 138 mEq/L (ref 135–145)
Total Bilirubin: 0.8 mg/dL (ref 0.2–1.2)
Total Protein: 6.9 g/dL (ref 6.0–8.3)

## 2013-12-25 LAB — LIPID PANEL
CHOLESTEROL: 173 mg/dL (ref 0–200)
HDL: 48.4 mg/dL (ref >39.00)
LDL Cholesterol: 104 mg/dL — ABNORMAL HIGH (ref 0–99)
NonHDL: 124.6
TRIGLYCERIDES: 101 mg/dL (ref 0.0–149.0)
Total CHOL/HDL Ratio: 4
VLDL: 20.2 mg/dL (ref 0.0–40.0)

## 2013-12-25 LAB — URIC ACID: Uric Acid, Serum: 5.5 mg/dL (ref 4.0–7.8)

## 2014-01-01 ENCOUNTER — Encounter: Payer: Self-pay | Admitting: Family Medicine

## 2014-01-01 ENCOUNTER — Ambulatory Visit (INDEPENDENT_AMBULATORY_CARE_PROVIDER_SITE_OTHER): Payer: Medicare Other | Admitting: Family Medicine

## 2014-01-01 VITALS — BP 138/84 | HR 61 | Temp 98.1°F | Ht 73.0 in | Wt 263.8 lb

## 2014-01-01 DIAGNOSIS — Z23 Encounter for immunization: Secondary | ICD-10-CM

## 2014-01-01 DIAGNOSIS — E876 Hypokalemia: Secondary | ICD-10-CM

## 2014-01-01 DIAGNOSIS — Z0000 Encounter for general adult medical examination without abnormal findings: Secondary | ICD-10-CM | POA: Diagnosis not present

## 2014-01-01 DIAGNOSIS — Z7189 Other specified counseling: Secondary | ICD-10-CM | POA: Insufficient documentation

## 2014-01-01 MED ORDER — SILDENAFIL CITRATE 100 MG PO TABS: 100.0000 mg | ORAL_TABLET | Freq: Every day | ORAL | Status: DC | PRN

## 2014-01-01 NOTE — Progress Notes (Signed)
Pre visit review using our clinic review tool, if applicable. No additional management support is needed unless otherwise documented below in the visit note. 

## 2014-01-01 NOTE — Progress Notes (Signed)
Expand All Collapse All   HPI  I have personally reviewed the Medicare Annual Wellness questionnaire and have noted  1. The patient's medical and social history  2. Their use of alcohol, tobacco or illicit drugs  3. Their current medications and supplements  4. The patient's functional ability including ADL's, fall risks, home safety risks and hearing or visual  impairment.  5. Diet and physical activities  6. Evidence for depression or mood disorders  The patients weight, height, BMI and visual acuity have been recorded in the chart  I have made referrals, counseling and provided education to the patient based review of the above and I have provided the pt with a written personalized care plan for preventive services.   Hypertension: Well controlled on HCTZ  BP Readings from Last 3 Encounters:  01/01/14 138/84  07/10/13 122/84  04/11/13 130/80              Using medication without problems or lightheadedness:  Chest pain with exertion: None  Edema:None  Short of breath:mild Average home BPs: At home 130/70s  Elevated Cholesterol:Well controlled on diet.  Lab Results  Component Value Date   CHOL 173 12/25/2013   HDL 48.40 12/25/2013   LDLCALC 104* 12/25/2013   TRIG 101.0 12/25/2013   CHOLHDL 4 12/25/2013                                    Working on portion control, some snacking.  Diet not as gfood this year compared to last gibven trips. Wt Readings from Last 3 Encounters:  01/01/14 263 lb 12 oz (119.636 kg)  07/10/13 266 lb 4 oz (120.77 kg)  04/11/13 274 lb (124.286 kg)                Goes to gym 2 times a week, treadmill, weights. Playing golf.  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 Followed by Dr. Loanne Drilling.  Lab Results  Component Value Date   TSH 4.50 04/11/2013    Elevated PSA: Followed by Dr. Janice Norrie, Mild nocturia but only once a night.  On hytrin, stopped avodart.  No current urinary issue, no flow issue, does urinate 3  times at night.  Cialis not helping much with ED, wishes to try a different.  Gout: On allopurinol, uric acid well controlled. No recent gout flares or new stones.   Prediabetes: improved  Review of Systems  Constitutional: Negative for fever, fatigue and unexpected weight change.  HENT: Negative for ear pain, sore throat, rhinorrhea, trouble swallowing and has noted phlegm in troat, clearing throat at noght and in morning off and on in last year, more persistant in last 2 months. No sneeze, no itchy eyes.  Eyes: Negative for pain.  Respiratory: Negative for cough, shortness of breath and wheezing.  Cardiovascular: Negative for chest pain, palpitations and leg swelling.  Gastrointestinal: Negative for nausea, abdominal pain, diarrhea, constipation and blood in stool.  Genitourinary: Negative for dysuria, urgency, hematuria, discharge, penile swelling, scrotal swelling, difficulty urinating, penile pain and testicular pain.  ED  Skin: Negative for rash.  Neurological: Negative for syncope, weakness, light-headedness, numbness and headaches.  Psychiatric/Behavioral: Negative for behavioral problems and dysphoric mood. The patient is not nervous/anxious.  MSK: sore and stiff in right ankle after playing golf. Some swelling and pain medically , no injury. No changes in shoes or activity.  Objective:   Physical Exam  Constitutional: He appears well-developed and well-nourished.  Non-toxic appearance. He does not appear ill. No distress.  HENT:  Head: Normocephalic and atraumatic.  Right Ear: Hearing, tympanic membrane, external ear and ear canal normal.  Left Ear: Hearing, tympanic membrane, external ear and ear canal normal.  Nose: Nasal turbinate swelling , mildly pale.  Mouth/Throat: Uvula is midline, oropharynx is clear and moist and mucous membranes are normal.  Eyes: Conjunctivae, EOM and lids are normal. Pupils are equal, round, and reactive to light. No foreign  bodies found.  Neck: Trachea normal, normal range of motion and phonation normal. Neck supple. Carotid bruit is not present. No mass and no thyromegaly present.  Cardiovascular: Normal rate, regular rhythm, S1 normal, S2 normal, intact distal pulses and normal pulses. Exam reveals no gallop.  No murmur heard.  Pulmonary/Chest: Breath sounds normal. He has no wheezes. He has no rhonchi. He has no rales.  Abdominal: Soft. Normal appearance and bowel sounds are normal. There is no hepatosplenomegaly. There is no tenderness. There is no rebound, no guarding and no CVA tenderness. No hernia.  Lymphadenopathy:  He has no cervical adenopathy.  Neurological: He is alert. He has normal strength and normal reflexes. No cranial nerve deficit or sensory deficit. Gait normal.  Skin: Skin is warm, dry and intact. No rash noted.  Psychiatric: He has a normal mood and affect. His speech is normal and behavior is normal. Judgment normal.  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 Callus at left lateral foot over distal MCP joint, mild ttp. Assessment & Plan:   Annual Medicare Wellness Exam The patient's preventative maintenance and recommended screening tests for an annual wellness exam were reviewed in full today.  Brought up to date unless services declined.  Counselled on the importance of diet, exercise, and its role in overall health and mortality.  The patient's FH and SH was reviewed, including their home life, tobacco status, and drug and alcohol status.   Last colonoscopy in 2007.Marland Kitchen Repeat in 10 years.  PSA, prostate per Dr. Janice Norrie.  Up to Date with PNA vaccine, flu, shingles, and Tdap. Given prevnar today. Former smoker>60 pack year history.. CXR 2014 nml.   Baby ASA for prevention.

## 2014-01-01 NOTE — Assessment & Plan Note (Signed)
Increase oin diet, recheck K and Mg in 2 weeks.

## 2014-01-01 NOTE — Patient Instructions (Addendum)
Trial of viagra. Increase potassium in diet. Return in 2 weeks for potassium and magnesium recheck.  keep working on healthy eating, regular exercise and weight loss.  Hypokalemia Hypokalemia means that the amount of potassium in the blood is lower than normal.Potassium is a chemical, called an electrolyte, that helps regulate the amount of fluid in the body. It also stimulates muscle contraction and helps nerves function properly.Most of the body's potassium is inside of cells, and only a very small amount is in the blood. Because the amount in the blood is so small, minor changes can be life-threatening. CAUSES  Antibiotics.  Diarrhea or vomiting.  Using laxatives too much, which can cause diarrhea.  Chronic kidney disease.  Water pills (diuretics).  Eating disorders (bulimia).  Low magnesium level.  Sweating a lot. SIGNS AND SYMPTOMS  Weakness.  Constipation.  Fatigue.  Muscle cramps.  Mental confusion.  Skipped heartbeats or irregular heartbeat (palpitations).  Tingling or numbness. DIAGNOSIS  Your health care provider can diagnose hypokalemia with blood tests. In addition to checking your potassium level, your health care provider may also check other lab tests. TREATMENT Hypokalemia can be treated with potassium supplements taken by mouth or adjustments in your current medicines. If your potassium level is very low, you may need to get potassium through a vein (IV) and be monitored in the hospital. A diet high in potassium is also helpful. Foods high in potassium are:  Nuts, such as peanuts and pistachios.  Seeds, such as sunflower seeds and pumpkin seeds.  Peas, lentils, and lima beans.  Whole grain and bran cereals and breads.  Fresh fruit and vegetables, such as apricots, avocado, bananas, cantaloupe, kiwi, oranges, tomatoes, asparagus, and potatoes.  Orange and tomato juices.  Red meats.  Fruit yogurt. HOME CARE INSTRUCTIONS  Take all medicines  as prescribed by your health care provider.  Maintain a healthy diet by including nutritious food, such as fruits, vegetables, nuts, whole grains, and lean meats.  If you are taking a laxative, be sure to follow the directions on the label. SEEK MEDICAL CARE IF:  Your weakness gets worse.  You feel your heart pounding or racing.  You are vomiting or having diarrhea.  You are diabetic and having trouble keeping your blood glucose in the normal range. SEEK IMMEDIATE MEDICAL CARE IF:  You have chest pain, shortness of breath, or dizziness.  You are vomiting or having diarrhea for more than 2 days.  You faint. MAKE SURE YOU:   Understand these instructions.  Will watch your condition.  Will get help right away if you are not doing well or get worse. Document Released: 02/08/2005 Document Revised: 11/29/2012 Document Reviewed: 08/11/2012 [facility] Patient Information 2015 [place], [place]. This information is not intended to replace advice given to you by your health care provider. Make sure you discuss any questions you have with your health care provider.

## 2014-01-16 ENCOUNTER — Other Ambulatory Visit (INDEPENDENT_AMBULATORY_CARE_PROVIDER_SITE_OTHER): Payer: Medicare Other

## 2014-01-16 DIAGNOSIS — E876 Hypokalemia: Secondary | ICD-10-CM

## 2014-01-16 LAB — MAGNESIUM: Magnesium: 2 mg/dL (ref 1.5–2.5)

## 2014-01-16 LAB — POTASSIUM: POTASSIUM: 3.8 meq/L (ref 3.5–5.1)

## 2014-01-18 ENCOUNTER — Encounter: Payer: Self-pay | Admitting: *Deleted

## 2014-01-23 DIAGNOSIS — H1013 Acute atopic conjunctivitis, bilateral: Secondary | ICD-10-CM | POA: Diagnosis not present

## 2014-01-23 DIAGNOSIS — H2513 Age-related nuclear cataract, bilateral: Secondary | ICD-10-CM | POA: Diagnosis not present

## 2014-02-15 ENCOUNTER — Other Ambulatory Visit: Payer: Self-pay | Admitting: Family Medicine

## 2014-02-22 ENCOUNTER — Other Ambulatory Visit: Payer: Self-pay | Admitting: Family Medicine

## 2014-03-14 DIAGNOSIS — R972 Elevated prostate specific antigen [PSA]: Secondary | ICD-10-CM | POA: Diagnosis not present

## 2014-03-20 ENCOUNTER — Ambulatory Visit (INDEPENDENT_AMBULATORY_CARE_PROVIDER_SITE_OTHER): Payer: Medicare Other | Admitting: Endocrinology

## 2014-03-20 ENCOUNTER — Encounter: Payer: Self-pay | Admitting: Endocrinology

## 2014-03-20 VITALS — BP 132/86 | HR 48 | Temp 98.7°F | Ht 73.0 in | Wt 269.0 lb

## 2014-03-20 DIAGNOSIS — E890 Postprocedural hypothyroidism: Secondary | ICD-10-CM

## 2014-03-20 DIAGNOSIS — R001 Bradycardia, unspecified: Secondary | ICD-10-CM

## 2014-03-20 LAB — TSH: TSH: 16.95 u[IU]/mL — ABNORMAL HIGH (ref 0.35–4.50)

## 2014-03-20 MED ORDER — LEVOTHYROXINE SODIUM 75 MCG PO TABS: 75.0000 ug | ORAL_TABLET | Freq: Every day | ORAL | Status: DC

## 2014-03-20 NOTE — Patient Instructions (Signed)
blood tests are being requested for you today.  We'll contact you with results.   Please come back for a follow-up appointment in 6 months.   most of the time, a "lumpy thyroid" will eventually become overactive again.  this is usually a slow process, happening over the span of many years.

## 2014-03-20 NOTE — Progress Notes (Signed)
Subjective:    Patient ID: Jason Solomon, male    DOB: 12/09/1940, 74 y.o.   MRN: [id number]  HPI  Pt had i-131 rx for hyperthyroidism, due to multinodular goiter, in late 2012.  He has had a fluctuating requirement for synthroid, which he says he takes as rx'ed.  He does not notice the goiter.  F/U US in 2014 showed significant shrinkage of the goiter. He denies dizziness Past Medical History  Diagnosis Date  . DIVERTICULOSIS, COLON 01/12/2007  . GOUT 01/12/2007  . HYPERCHOLESTEROLEMIA 06/20/2007  . 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 OBESITY 01/12/2007  . HYPERTENSION 01/12/2007  . PROSTATE SPECIFIC ANTIGEN, ELEVATED 10/08/2009  . HYPERTHYROIDISM 04/24/2010  . GOITER, MULTINODULAR 05/04/2010  . 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 Hernia repair  1990's    right and left inguinal hernia  . Knee surgery  1990's  . Hand surgery  1949    left hand laceration  . Tonsillectomy  1945  . Cardiovascular stress test  2006    negative    History   Social History  . Marital Status: Married    Spouse Name: N/A    Number of Children: 4  . Years of Education: N/A   Occupational History  .      Retired   Social History Main Topics  . Smoking status: Former Smoker    Quit date: 02/23/1988  . Smokeless tobacco: Never Used  . Alcohol Use: 0.0 oz/week    0 Not specified per week     Comment: moderate-1 glass of wine daily, occ. scotch  . Drug Use: No  . Sexual Activity: Not on file   Other Topics Concern  . Not on file   Social History Narrative   Regular exercise-yes, walks , golf   Occupation: Retired Hydrographic surveyor   Diet: (+) fruit, (+) veggies, (+) salad, (+) H20   Children: 4, healthy   Married x 43 years   Living will, Jason Solomon, daughter. Full code. (reviewed 2013)    Current Outpatient Prescriptions on File Prior to Visit  Medication Sig Dispense Refill  .  allopurinol (ZYLOPRIM) 300 MG tablet TAKE 1 TABLET DAILY 90 tablet 3  . hydrochlorothiazide (HYDRODIURIL) 25 MG tablet TAKE 1 TABLET DAILY 90 tablet 1  . mometasone (NASONEX) 50 MCG/ACT nasal spray Place 2 sprays into the nose as needed. 17 g 0  . montelukast (SINGULAIR) 10 MG tablet Take 1 tablet (10 mg total) by mouth daily as needed. Only when flying 30 tablet 0  . sildenafil (VIAGRA) 100 MG tablet Take 1 tablet (100 mg total) by mouth daily as needed for erectile dysfunction. 10 tablet 0  . terazosin (HYTRIN) 5 MG capsule TAKE 1 CAPSULE DAILY 90 capsule 3  . zoster vaccine live, PF, (ZOSTAVAX) 16109 UNT/0.65ML injection Inject 19,400 Units into the skin once. 1 each 0   No current facility-administered medications on file prior to visit.    Allergies  Allergen Reactions  . Ace Inhibitors     REACTION: ?angioedema  . Angiotensin Receptor Blockers     REACTION: angioedema on ACE?    Family History  Problem Relation Age of Onset  . Hypertension Mother   . COPD Brother   . Cancer Brother 40    lung cancer  . Thyroid disease Neg Hx   . Cancer Brother     Throat Cancer  BP 132/86 mmHg  Pulse 48  Temp(Src) 98.7 F (37.1 C) (Oral)  Ht 6\' 1"  (1.854 m)  Wt 269 lb (122.018 kg)  BMI 35.50 kg/m2  SpO2 95%  Review of Systems denies LOC and weight change    Objective:   Physical Exam VITAL SIGNS:  See vs page GENERAL: no distress HEART:  slow rate, but reg rhythm; no murmurs noted. Normal S1,S2.   NECK: There is no palpable thyroid enlargement.  No thyroid nodule is palpable.  No palpable lymphadenopathy at the anterior neck. Skin: not diaphoretic Ext: 1+ bilat leg edema Neuro: no tremor    i personally reviewed electrocardiogram tracing: normal except for bradycardia    Lab Results  Component Value Date   TSH 16.95* 03/20/2014      Assessment & Plan:  Bradycardia, new to me.  Asymptomatic.  i told pt no rx is needed, except adjustment of the synthroid.    Hypothyroidism: moderate exacerbation.   Goiter: no [place] is needed this year.   Patient is advised the following: Patient Instructions  blood tests are being requested for you today.  We'll contact you with results.   Please come back for a follow-up appointment in 6 months.   most of the time, a "lumpy thyroid" will eventually become overactive again.  this is usually a slow process, happening over the span of many years.

## 2014-03-21 ENCOUNTER — Encounter: Payer: Self-pay | Admitting: Endocrinology

## 2014-03-21 DIAGNOSIS — R972 Elevated prostate specific antigen [PSA]: Secondary | ICD-10-CM | POA: Diagnosis not present

## 2014-03-21 DIAGNOSIS — R351 Nocturia: Secondary | ICD-10-CM | POA: Diagnosis not present

## 2014-03-21 DIAGNOSIS — N401 Enlarged prostate with lower urinary tract symptoms: Secondary | ICD-10-CM | POA: Diagnosis not present

## 2014-04-22 ENCOUNTER — Other Ambulatory Visit: Payer: Managed Care, Other (non HMO)

## 2014-04-23 ENCOUNTER — Other Ambulatory Visit (INDEPENDENT_AMBULATORY_CARE_PROVIDER_SITE_OTHER): Payer: Medicare Other

## 2014-04-23 ENCOUNTER — Other Ambulatory Visit: Payer: Self-pay | Admitting: Endocrinology

## 2014-04-23 DIAGNOSIS — E890 Postprocedural hypothyroidism: Secondary | ICD-10-CM | POA: Diagnosis not present

## 2014-04-23 LAB — TSH: TSH: 8.88 u[IU]/mL — ABNORMAL HIGH (ref 0.35–4.50)

## 2014-04-23 MED ORDER — LEVOTHYROXINE SODIUM 100 MCG PO TABS: 100.0000 ug | ORAL_TABLET | Freq: Every day | ORAL | Status: DC

## 2014-04-24 ENCOUNTER — Encounter: Payer: Self-pay | Admitting: Endocrinology

## 2014-04-24 DIAGNOSIS — R312 Other microscopic hematuria: Secondary | ICD-10-CM | POA: Diagnosis not present

## 2014-05-06 ENCOUNTER — Other Ambulatory Visit: Payer: Self-pay

## 2014-05-06 MED ORDER — HYDROCHLOROTHIAZIDE 25 MG PO TABS: 25.0000 mg | ORAL_TABLET | Freq: Every day | ORAL | Status: DC

## 2014-05-06 NOTE — Telephone Encounter (Signed)
pts wife request HCTZ to new mail order pharmacy [facility]. Advised done.

## 2014-05-07 DIAGNOSIS — N400 Enlarged prostate without lower urinary tract symptoms: Secondary | ICD-10-CM | POA: Diagnosis not present

## 2014-05-07 DIAGNOSIS — N202 Calculus of kidney with calculus of ureter: Secondary | ICD-10-CM | POA: Diagnosis not present

## 2014-05-07 DIAGNOSIS — R312 Other microscopic hematuria: Secondary | ICD-10-CM | POA: Diagnosis not present

## 2014-05-16 ENCOUNTER — Telehealth: Payer: Self-pay | Admitting: Endocrinology

## 2014-05-16 MED ORDER — LEVOTHYROXINE SODIUM 100 MCG PO TABS: 100.0000 ug | ORAL_TABLET | Freq: Every day | ORAL | Status: DC

## 2014-05-16 NOTE — Telephone Encounter (Signed)
Rx sent to pharmacy per request.  

## 2014-05-16 NOTE — Telephone Encounter (Signed)
Patient would like a refill on his medication   Rx: Levothyroxine 100 mcg 90 day   Pharmacy: [facility]  Thank you

## 2014-05-31 DIAGNOSIS — R972 Elevated prostate specific antigen [PSA]: Secondary | ICD-10-CM | POA: Diagnosis not present

## 2014-05-31 DIAGNOSIS — R312 Other microscopic hematuria: Secondary | ICD-10-CM | POA: Diagnosis not present

## 2014-05-31 DIAGNOSIS — N200 Calculus of kidney: Secondary | ICD-10-CM | POA: Diagnosis not present

## 2014-05-31 DIAGNOSIS — N201 Calculus of ureter: Secondary | ICD-10-CM | POA: Diagnosis not present

## 2014-09-19 ENCOUNTER — Other Ambulatory Visit: Payer: Self-pay | Admitting: Urology

## 2014-09-19 DIAGNOSIS — N200 Calculus of kidney: Secondary | ICD-10-CM | POA: Diagnosis not present

## 2014-09-23 ENCOUNTER — Encounter (HOSPITAL_COMMUNITY): Payer: Self-pay | Admitting: *Deleted

## 2014-09-30 ENCOUNTER — Encounter: Payer: Self-pay | Admitting: Endocrinology

## 2014-09-30 ENCOUNTER — Other Ambulatory Visit: Payer: Self-pay

## 2014-09-30 ENCOUNTER — Ambulatory Visit (INDEPENDENT_AMBULATORY_CARE_PROVIDER_SITE_OTHER): Payer: Medicare Other | Admitting: Endocrinology

## 2014-09-30 VITALS — BP 116/79 | HR 56 | Temp 97.7°F | Ht 73.0 in | Wt 266.0 lb

## 2014-09-30 DIAGNOSIS — E042 Nontoxic multinodular goiter: Secondary | ICD-10-CM

## 2014-09-30 LAB — TSH: TSH: 3.62 u[IU]/mL (ref 0.35–4.50)

## 2014-09-30 NOTE — Patient Instructions (Addendum)
A thyroid blood test is requested for you today.  We'll contact you with results.   Please come back for a follow-up appointment in 6 months.   most of the time, a "lumpy thyroid" will eventually become overactive again.  this is usually a slow process, happening over the span of many years.

## 2014-09-30 NOTE — Progress Notes (Signed)
Subjective:    Patient ID: Jason Solomon, male    DOB: 1941/01/03, 74 y.o.   MRN: [id number]  HPI Pt had i-131 rx for hyperthyroidism, due to multinodular goiter, in late 2012.  He has had a fluctuating requirement for synthroid, which he says he takes as rx'ed.  He does not notice the goiter.  F/U US in 2014 showed significant shrinkage of the goiter. pt states he feels no different since the synthroid was increased (and well in general). Past Medical History  Diagnosis Date  . DIVERTICULOSIS, COLON 01/12/2007  . GOUT 01/12/2007  . HYPERCHOLESTEROLEMIA 06/20/2007  . 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 OBESITY 01/12/2007  . HYPERTENSION 01/12/2007  . PROSTATE SPECIFIC ANTIGEN, ELEVATED 10/08/2009  . HYPERTHYROIDISM 04/24/2010  . GOITER, MULTINODULAR 05/04/2010  . 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 Renal calculi     Past Surgical History  Procedure Laterality Date  . Hernia repair  1990's    right and left inguinal hernia  . Knee surgery  1990's  . Hand surgery  1949    left hand laceration  . Tonsillectomy  1945  . Cardiovascular stress test  2006    negative    History   Social History  . Marital Status: Married    Spouse Name: N/A  . Number of Children: 4  . Years of Education: N/A   Occupational History  .      Retired   Social History Main Topics  . Smoking status: Former Smoker    Quit date: 02/23/1988  . Smokeless tobacco: Never Used  . Alcohol Use: 0.0 oz/week    0 Standard drinks or equivalent per week     Comment: moderate-1 glass of wine daily, occ. scotch  . Drug Use: No  . Sexual Activity: Not on file   Other Topics Concern  . Not on file   Social History Narrative   Regular exercise-yes, walks , golf   Occupation: Retired Hydrographic surveyor   Diet: (+) fruit, (+) veggies, (+) salad, (+) H20   Children: 4, healthy   Married x 43 years   Living will, [place]lynne, daughter. Full code. (reviewed 2013)     Current Outpatient Prescriptions on File Prior to Visit  Medication Sig Dispense Refill  . allopurinol (ZYLOPRIM) 300 MG tablet TAKE 1 TABLET DAILY 90 tablet 3  . hydrochlorothiazide (HYDRODIURIL) 25 MG tablet Take 1 tablet (25 mg total) by mouth daily. 90 tablet 1  . levothyroxine (SYNTHROID, LEVOTHROID) 100 MCG tablet Take 1 tablet (100 mcg total) by mouth daily before breakfast. 90 tablet 1  . mometasone (NASONEX) 50 MCG/ACT nasal spray Place 2 sprays into the nose as needed. 17 g 0  . montelukast (SINGULAIR) 10 MG tablet Take 1 tablet (10 mg total) by mouth daily as needed. Only when flying 30 tablet 0  . sildenafil (VIAGRA) 100 MG tablet Take 1 tablet (100 mg total) by mouth daily as needed for erectile dysfunction. 10 tablet 0  . terazosin (HYTRIN) 5 MG capsule TAKE 1 CAPSULE DAILY 90 capsule 3  . zoster vaccine live, PF, (ZOSTAVAX) 46962 UNT/0.65ML injection Inject 19,400 Units into the skin once. 1 each 0   No current facility-administered medications on file prior to visit.    Allergies  Allergen Reactions  . Ace Inhibitors     REACTION: ?angioedema  . Angiotensin Receptor Blockers     REACTION: angioedema on ACE?    Family History  Problem Relation Age of Onset  . Hypertension Mother   . COPD Brother   . Cancer Brother 40    lung cancer  . Thyroid disease Neg Hx   . Cancer Brother     Throat Cancer    BP 116/79 mmHg  Pulse 56  Temp(Src) 97.7 F (36.5 C) (Oral)  Ht 6\' 1"  (1.854 m)  Wt 266 lb (120.657 kg)  BMI 35.10 kg/m2  SpO2 97%  Review of Systems Denies dizziness.     Objective:   Physical Exam VITAL SIGNS:  See vs page GENERAL: no distress NECK: There is no palpable thyroid enlargement.  No thyroid nodule is palpable.  No palpable lymphadenopathy at the anterior neck.         Assessment & Plan:  Post-I-131 hypothyroidism, on rx  Patient is advised the following: Patient Instructions  A thyroid blood test is requested for you today.  We'll  contact you with results.   Please come back for a follow-up appointment in 6 months.   most of the time, a "lumpy thyroid" will eventually become overactive again.  this is usually a slow process, happening over the span of many years.

## 2014-10-02 NOTE — H&P (Signed)
History of Present Illness Jason Solomon returns for follow-up microhematuria. CT scan shows bilateral 1 cm renal calculi and a small right UVJ calculus without hydronephrosis. He does not have any flank pain. 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 He had ESL several years ago in [place]. He quit smoking 25 years ago. He has not had any renal pain. He has not retrieved the small right UVJ stone. He probably passed it. KUB today shows a 1 cm stone in the midpole of the left kidney. I can not clearly see the right kidney because of gas in the bowel.   Past Medical History Problems  1. History of Gout 2. History of esophageal reflux (Z87.19) 3. History of hyperparathyroidism (Z86.39) 4. History of hypertension (Z86.79) 5. History of kidney stones (Z87.442) 6. History of Microscopic hematuria (R31.2) 7. History of Murmur (R01.1)  Surgical History Problems  1. History of Hand Repair 2. History of Inguinal Hernia Repair 3. History of Knee Surgery 4. Preventive medication therapy needed (Z41.8)  Current Meds 1. Allopurinol TABS;  Therapy: (Recorded:15Sep2011) to Recorded 2. Hydrochlorothiazide TABS;  Therapy: (Recorded:11Apr2012) to Recorded 3. Potassium Citrate ER 10 MEQ (1080 MG) Oral Tablet Extended Release; TAKE 1 TABLET  3 TIMES A  DAY;  Therapy: 08Apr2016 to (Last Rx:15Apr2016)  Requested for: 15Apr2016 Ordered 4. Synthroid 75 MCG Oral Tablet;  Therapy: [id number] to Recorded 5. Terazosin HCl TABS;  Therapy: (Recorded:15Sep2011) to Recorded  Allergies Medication  1. No Known Drug Allergies  Family History Problems  1. Family history of Death In The Family Father   Deceased at age 90 2. Family history of Death In The Family Mother   Deceased at age 74; natural causes 3. Family history of Dementia : Father 4. Family history of Epilepsy : Brother 5. Family history of Esophageal Cancer : Brother 33. Family history of Family Health  Status Number Of Children   1 son & 3 daughters 73. Family history of Lung Cancer : Brother 8. Family history of Stroke Syndrome : Father  Social History Problems  1. Alcohol Use   1 2. Caffeine Use   2; coffee 3. Former smoker [phone]) 4. Marital History - Currently Married 5. Retired From Work 67. Tobacco Use   smoked 2 ppd for 34 yrs and 21 + yrs ago  Review of Systems Genitourinary, constitutional, skin, eye, otolaryngeal, hematologic/lymphatic, cardiovascular, pulmonary, endocrine, musculoskeletal, gastrointestinal, neurological and psychiatric system(s) were reviewed and pertinent findings if present are noted and are otherwise negative.    Vitals Vital Signs [Data Includes: Last 1 Day]  Recorded: 28Jul2016 08:17AM  Weight: 264 lb  BMI Calculated: 33 BSA Calculated: 2.47 Blood Pressure: 149 / 80 Heart Rate: 48 Respiration: 18  Physical Exam Constitutional: Well nourished and well developed . No acute distress.  ENT:. The ears and nose are normal in appearance.  Neck: The appearance of the neck is normal and no neck mass is present.  Pulmonary: No respiratory distress and normal respiratory rhythm and effort.  Cardiovascular: Heart rate and rhythm are normal . No peripheral edema.  Abdomen: The abdomen is soft and nontender. No masses are palpated. No CVA tenderness. No hernias are palpable. No hepatosplenomegaly noted.  Genitourinary: Examination of the penis demonstrates no discharge, no masses, no lesions and a normal meatus. The scrotum is without lesions. The right epididymis is palpably normal and non-tender. The left epididymis is palpably normal and non-tender. The right testis is non-tender and without masses.  The left testis is non-tender and without masses.  Lymphatics: The femoral and inguinal nodes are not enlarged or tender.  Skin: Normal skin turgor, no visible rash and no visible skin lesions.  Neuro/Psych:. Mood and affect are appropriate.     Results/Data Urine [Data Includes: Last 1 Day]   [id number]  COLOR AMBER   APPEARANCE CLEAR   SPECIFIC GRAVITY 1.025   pH 6.5   GLUCOSE NEGATIVE   BILIRUBIN NEGATIVE   KETONE NEGATIVE   BLOOD 1+   PROTEIN NEGATIVE   NITRITE NEGATIVE   LEUKOCYTE ESTERASE NEGATIVE   SQUAMOUS EPITHELIAL/HPF 0-5 HPF  WBC 0-5 WBC/HPF  RBC 3-10 RBC/HPF  BACTERIA NONE SEEN HPF  CRYSTALS NONE SEEN HPF  CASTS NONE SEEN LPF  Yeast NONE SEEN HPF   KUB  INDICATION: Bilateral renal calculi.  KUB shows normal bowel gas pattern. The bony and soft tissues structures are unremarkable. There is a 1 cm calcification in the midpole of the left kidney. The right kidney is obscured by stools and gas in the colon.    IMPRESSION: Left renal calculus. Right kidney not well seen.   Assessment Assessed  1. Nephrolithiasis (N20.0)  He is known to have bilateral renal calculi.   Plan Health Maintenance  1. UA With REFLEX; [Do Not Release]; Status:Resulted - Requires Verification;   DoneZS:[id number] 07:56AM Nephrolithiasis  2. KUB; Status:Resulted - Requires Verification;   DoneOW:[id number] 09:12AM  He needs surgical management of the renal calculi. The options are: ESL, PCNL, ureteroscopy with holmium laser lithotripsy. I told him that ESL is the least invasive of those procedures. He had ESL in the past. The risks of ESL include but are not limited to hemorrhage, renal or perirenal hematoma, inability to fragment the stone, steinstrasse. He understands and wishes to proceed.

## 2014-10-03 ENCOUNTER — Encounter (HOSPITAL_COMMUNITY)
Admission: RE | Disposition: A | Discharge status: Home / Self Care | Payer: Self-pay | Source: Ambulatory Visit | Attending: Urology

## 2014-10-03 ENCOUNTER — Ambulatory Visit (HOSPITAL_COMMUNITY)
Admission: RE | Admit: 2014-10-03 | Discharge: 2014-10-03 | Disposition: A | Discharge status: Home / Self Care | Payer: Medicare Other | Source: Ambulatory Visit | Attending: Urology | Admitting: Urology

## 2014-10-03 ENCOUNTER — Encounter (HOSPITAL_COMMUNITY): Payer: Self-pay | Admitting: General Practice

## 2014-10-03 ENCOUNTER — Ambulatory Visit (HOSPITAL_COMMUNITY): Payer: Medicare Other

## 2014-10-03 DIAGNOSIS — N200 Calculus of kidney: Secondary | ICD-10-CM | POA: Insufficient documentation

## 2014-10-03 DIAGNOSIS — E213 Hyperparathyroidism, unspecified: Secondary | ICD-10-CM | POA: Insufficient documentation

## 2014-10-03 DIAGNOSIS — K219 Gastro-esophageal reflux disease without esophagitis: Secondary | ICD-10-CM | POA: Insufficient documentation

## 2014-10-03 DIAGNOSIS — M109 Gout, unspecified: Secondary | ICD-10-CM | POA: Diagnosis not present

## 2014-10-03 DIAGNOSIS — Z79899 Other long term (current) drug therapy: Secondary | ICD-10-CM | POA: Insufficient documentation

## 2014-10-03 DIAGNOSIS — I10 Essential (primary) hypertension: Secondary | ICD-10-CM | POA: Insufficient documentation

## 2014-10-03 DIAGNOSIS — Z87442 Personal history of urinary calculi: Secondary | ICD-10-CM | POA: Diagnosis not present

## 2014-10-03 DIAGNOSIS — Z87891 Personal history of nicotine dependence: Secondary | ICD-10-CM | POA: Insufficient documentation

## 2014-10-03 HISTORY — DX: Calculus of kidney: N20.0

## 2014-10-03 SURGERY — LITHOTRIPSY, ESWL
Anesthesia: LOCAL | Laterality: Left

## 2014-10-03 MED ORDER — DIPHENHYDRAMINE HCL 25 MG PO CAPS
25.0000 mg | ORAL_CAPSULE | ORAL | Status: AC
  Administered 2014-10-03: 25 mg via ORAL
  Filled 2014-10-03: qty 1

## 2014-10-03 MED ORDER — SODIUM CHLORIDE 0.9 % IV SOLN
INTRAVENOUS | Status: DC
  Administered 2014-10-03: 08:00:00 via INTRAVENOUS

## 2014-10-03 MED ORDER — DIAZEPAM 5 MG PO TABS
10.0000 mg | ORAL_TABLET | ORAL | Status: AC
  Administered 2014-10-03: 10 mg via ORAL
  Filled 2014-10-03: qty 2

## 2014-10-03 MED ORDER — CIPROFLOXACIN HCL 500 MG PO TABS
500.0000 mg | ORAL_TABLET | ORAL | Status: AC
  Administered 2014-10-03: 500 mg via ORAL
  Filled 2014-10-03: qty 1

## 2014-10-03 NOTE — Op Note (Signed)
Refer to [facility] Op Note scanned in the chart 

## 2014-10-07 ENCOUNTER — Other Ambulatory Visit: Payer: Self-pay | Admitting: *Deleted

## 2014-10-07 MED ORDER — HYDROCHLOROTHIAZIDE 25 MG PO TABS: 25.0000 mg | ORAL_TABLET | Freq: Every day | ORAL | Status: DC

## 2014-10-07 NOTE — Telephone Encounter (Signed)
Received faxed refill request from pharmacy. Refill sent to pharmacy electronically. 

## 2014-10-24 DIAGNOSIS — N200 Calculus of kidney: Secondary | ICD-10-CM | POA: Diagnosis not present

## 2014-11-13 DIAGNOSIS — N200 Calculus of kidney: Secondary | ICD-10-CM | POA: Diagnosis not present

## 2014-11-18 ENCOUNTER — Encounter: Payer: Self-pay | Admitting: Endocrinology

## 2014-11-18 ENCOUNTER — Other Ambulatory Visit: Payer: Self-pay

## 2014-11-18 MED ORDER — LEVOTHYROXINE SODIUM 100 MCG PO TABS: 100.0000 ug | ORAL_TABLET | Freq: Every day | ORAL | Status: DC

## 2014-11-19 DIAGNOSIS — N200 Calculus of kidney: Secondary | ICD-10-CM | POA: Diagnosis not present

## 2014-11-22 ENCOUNTER — Other Ambulatory Visit: Payer: Self-pay | Admitting: Urology

## 2014-11-29 ENCOUNTER — Ambulatory Visit (INDEPENDENT_AMBULATORY_CARE_PROVIDER_SITE_OTHER): Payer: Medicare Other

## 2014-11-29 DIAGNOSIS — Z23 Encounter for immunization: Secondary | ICD-10-CM

## 2014-11-30 ENCOUNTER — Other Ambulatory Visit: Payer: Self-pay | Admitting: Family Medicine

## 2014-12-25 ENCOUNTER — Encounter (HOSPITAL_COMMUNITY): Payer: Self-pay | Admitting: *Deleted

## 2014-12-30 ENCOUNTER — Telehealth: Payer: Self-pay | Admitting: Family Medicine

## 2014-12-30 ENCOUNTER — Other Ambulatory Visit (INDEPENDENT_AMBULATORY_CARE_PROVIDER_SITE_OTHER): Payer: Medicare Other

## 2014-12-30 DIAGNOSIS — E7800 Pure hypercholesterolemia, unspecified: Secondary | ICD-10-CM

## 2014-12-30 DIAGNOSIS — M1A00X0 Idiopathic chronic gout, unspecified site, without tophus (tophi): Secondary | ICD-10-CM

## 2014-12-30 DIAGNOSIS — R972 Elevated prostate specific antigen [PSA]: Secondary | ICD-10-CM

## 2014-12-30 LAB — COMPREHENSIVE METABOLIC PANEL
ALK PHOS: 43 U/L (ref 39–117)
ALT: 11 U/L (ref 0–53)
AST: 19 U/L (ref 0–37)
Albumin: 3.6 g/dL (ref 3.5–5.2)
BUN: 19 mg/dL (ref 6–23)
CALCIUM: 9.7 mg/dL (ref 8.4–10.5)
CO2: 33 meq/L — AB (ref 19–32)
CREATININE: 1.09 mg/dL (ref 0.40–1.50)
Chloride: 105 mEq/L (ref 96–112)
GFR: 85.03 mL/min (ref >60.00)
GLUCOSE: 98 mg/dL (ref 70–99)
Potassium: 4 mEq/L (ref 3.5–5.1)
Sodium: 142 mEq/L (ref 135–145)
TOTAL PROTEIN: 6.5 g/dL (ref 6.0–8.3)
Total Bilirubin: 1 mg/dL (ref 0.2–1.2)

## 2014-12-30 LAB — LIPID PANEL
CHOL/HDL RATIO: 3
Cholesterol: 152 mg/dL (ref 0–200)
HDL: 53.7 mg/dL (ref >39.00)
LDL Cholesterol: 83 mg/dL (ref 0–99)
NONHDL: 98.07
Triglycerides: 75 mg/dL (ref 0.0–149.0)
VLDL: 15 mg/dL (ref 0.0–40.0)

## 2014-12-30 LAB — URIC ACID: Uric Acid, Serum: 5 mg/dL (ref 4.0–7.8)

## 2014-12-30 NOTE — Telephone Encounter (Signed)
-----   Message from Ellamae Sia sent at 12/26/2014 10:55 AM EDT ----- Regarding: Lab orders for Monday, 11.7.16 Patient is scheduled for CPX labs, please order future labs, Thanks , Karna Christmas

## 2015-01-02 ENCOUNTER — Ambulatory Visit (HOSPITAL_COMMUNITY)
Admission: RE | Admit: 2015-01-02 | Discharge: 2015-01-02 | Disposition: A | Discharge status: Home / Self Care | Payer: Medicare Other | Source: Ambulatory Visit | Attending: Urology | Admitting: Urology

## 2015-01-02 ENCOUNTER — Ambulatory Visit (HOSPITAL_COMMUNITY): Payer: Medicare Other

## 2015-01-02 ENCOUNTER — Encounter (HOSPITAL_COMMUNITY)
Admission: RE | Disposition: A | Discharge status: Home / Self Care | Payer: Self-pay | Source: Ambulatory Visit | Attending: Urology

## 2015-01-02 ENCOUNTER — Encounter (HOSPITAL_COMMUNITY): Payer: Self-pay | Admitting: General Practice

## 2015-01-02 DIAGNOSIS — I10 Essential (primary) hypertension: Secondary | ICD-10-CM | POA: Insufficient documentation

## 2015-01-02 DIAGNOSIS — M109 Gout, unspecified: Secondary | ICD-10-CM | POA: Diagnosis not present

## 2015-01-02 DIAGNOSIS — E213 Hyperparathyroidism, unspecified: Secondary | ICD-10-CM | POA: Diagnosis not present

## 2015-01-02 DIAGNOSIS — N200 Calculus of kidney: Secondary | ICD-10-CM | POA: Insufficient documentation

## 2015-01-02 DIAGNOSIS — Z79899 Other long term (current) drug therapy: Secondary | ICD-10-CM | POA: Diagnosis not present

## 2015-01-02 DIAGNOSIS — Z87891 Personal history of nicotine dependence: Secondary | ICD-10-CM | POA: Diagnosis not present

## 2015-01-02 DIAGNOSIS — Z87442 Personal history of urinary calculi: Secondary | ICD-10-CM | POA: Diagnosis not present

## 2015-01-02 DIAGNOSIS — K219 Gastro-esophageal reflux disease without esophagitis: Secondary | ICD-10-CM | POA: Insufficient documentation

## 2015-01-02 HISTORY — DX: Gastro-esophageal reflux disease without esophagitis: K21.9

## 2015-01-02 HISTORY — DX: Cardiac murmur, unspecified: R01.1

## 2015-01-02 HISTORY — DX: Reserved for inherently not codable concepts without codable children: IMO0001

## 2015-01-02 SURGERY — LITHOTRIPSY, ESWL
Anesthesia: LOCAL | Laterality: Right

## 2015-01-02 MED ORDER — SODIUM CHLORIDE 0.9 % IV SOLN
INTRAVENOUS | Status: DC
  Administered 2015-01-02: 07:00:00 via INTRAVENOUS

## 2015-01-02 MED ORDER — DIPHENHYDRAMINE HCL 25 MG PO CAPS
25.0000 mg | ORAL_CAPSULE | ORAL | Status: AC
  Administered 2015-01-02: 25 mg via ORAL
  Filled 2015-01-02: qty 1

## 2015-01-02 MED ORDER — DIAZEPAM 5 MG PO TABS
10.0000 mg | ORAL_TABLET | ORAL | Status: AC
  Administered 2015-01-02: 10 mg via ORAL
  Filled 2015-01-02: qty 2

## 2015-01-02 MED ORDER — CIPROFLOXACIN HCL 500 MG PO TABS
500.0000 mg | ORAL_TABLET | ORAL | Status: AC
  Administered 2015-01-02: 500 mg via ORAL
  Filled 2015-01-02: qty 1

## 2015-01-02 NOTE — H&P (Signed)
History of Present Illness     [name] had ESL left renal pelvis calculus on 8/11. He passed several stone fragments. He has not had any pain. KUB today shows residual stone in the left renal pelvis. Urinalysis today shows 0-2 RBCs, 0-5 epithelial cells, no WBCs.       He has a history of microhematuria. CT scan showed bilateral 1 cm renal calculi and a small right UVJ calculus without hydronephrosis. 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 He had ESL several years ago in [place]. He quit smoking 25 years ago. Cystoscopy in April was unremarkable.       He has a history of elevated PSA. Prostate biopsy in April 2011 showed atypical cells at the left base. Repeat biopsy in July 2012 was negative. PCA3 in January was negative. His PSA fluctuates between 3.8 and 5.3.     Interval History:  The patient presents after passing multiple stone fragments. Stone analysis showed calcium monohydrate and calcium dihydrate. Urine pH is 6.0. He has a 24-hour urine specimen results pending. He's had no symptoms. He stopped passing stones.   Past Medical History Problems  1. History of Gout 2. History of esophageal reflux (Z87.19) 3. History of hyperparathyroidism (Z86.39) 4. History of hypertension (Z86.79) 5. History of kidney stones (Z87.442) 6. History of Microscopic hematuria (R31.29) 7. History of Murmur (R01.1)  Surgical History Problems  1. History of Hand Repair 2. History of Inguinal Hernia Repair 3. History of Knee Surgery 4. History of Lithotripsy 5. Preventive medication therapy needed (Z41.8)  Current Meds 1. Allopurinol TABS;  Therapy: (Recorded:15Sep2011) to Recorded 2. Hydrochlorothiazide TABS;  Therapy: (Recorded:11Apr2012) to Recorded 3. Potassium Citrate ER 10 MEQ (1080 MG) Oral Tablet Extended Release; TAKE 1 TABLET  3 TIMES A  DAY;  Therapy: 08Apr2016 to (Last Rx:15Apr2016)  Requested for: 15Apr2016 Ordered 4.  Synthroid 75 MCG Oral Tablet;  Therapy: [phone] to Recorded 5. Terazosin HCl TABS;  Therapy: (Recorded:15Sep2011) to Recorded  Allergies Medication  1. No Known Drug Allergies  Family History Problems  1. Family history of Death In The Family Father   Deceased at age 43 2. Family history of Death In The Family Mother   Deceased at age 73; natural causes 3. Family history of Dementia : Father 4. Family history of Epilepsy : Brother 5. Family history of Esophageal Cancer : Brother 103. Family history of Family Health Status Number Of Children   1 son & 3 daughters 68. Family history of Lung Cancer : Brother 8. Family history of Stroke Syndrome : Father  Social History Problems  1. Alcohol Use   1 2. Caffeine Use   2; coffee 3. Former smoker [phone]) 4. Marital History - Currently Married 5. Retired From Work 58. Tobacco Use   smoked 2 ppd for 34 yrs and 21 + yrs ago  Review of Systems Genitourinary, constitutional, skin, eye, otolaryngeal, hematologic/lymphatic, cardiovascular, pulmonary, endocrine, musculoskeletal, gastrointestinal, neurological and psychiatric system(s) were reviewed and pertinent findings if present are noted and are otherwise negative.    Vitals Vital Signs [Data Includes: Last 1 Day]  Recorded: 27Sep2016 08:06AM  Height: 6 ft 2.5 in Blood Pressure: 148 / 89 Temperature: 97.7 F Heart Rate: 51  Physical Exam Constitutional: Well nourished . No acute distress.   Pulmonary: No respiratory distress.   Cardiovascular:. No peripheral edema.   Abdomen: The abdomen is soft and nontender.   Skin: Normal skin turgor and no visible rash.  Results/Data Urine [Data Includes: Last 1 Day]   27Sep2016  COLOR YELLOW   APPEARANCE CLEAR   SPECIFIC GRAVITY 1.010   pH 6.0   GLUCOSE NEGATIVE   BILIRUBIN NEGATIVE   KETONE NEGATIVE   BLOOD TRACE   PROTEIN NEGATIVE   NITRITE NEGATIVE   LEUKOCYTE ESTERASE NEGATIVE   SQUAMOUS EPITHELIAL/HPF 0-5  HPF  WBC 0-5 WBC/HPF  RBC 0-2 RBC/HPF  BACTERIA NONE SEEN HPF  CRYSTALS NONE SEEN HPF  CASTS NONE SEEN LPF  Yeast NONE SEEN HPF      KUB today  INDICATION: S/P ESL left renal calculus. History of Bilateral stones  KUB shows normal bowel gas pattern. Psoas shadows are normal. The bony and soft tissues structures are unremarkable. There is a residual stone in the left renal pelvis. There is a calcification in the midpole of the right kidney.    IMPRESSION: Residual left renal pelvis. Right renal calculus.   Assessment Bilateral renal calculi s/p left ESWL approximately 4 weeks ago.     I discussed with the patient the treatment options for his bilateral stones. He seemed to respond relatively well to the left ESWL as he passed a large number of fragments. He does still have stone fragments on that side. He also has a right renal stone. I discussed the treatment options which included doing nothing with serial KUBs, staged ESWL, or bilateral ureteroscopy. He is aware that bilateral ureteroscopy may make him stone free after only one treatment, however he is more interested in proceeding with ESWL.   Plan   -plan for staged ESWL with right side first secondary to recent left ESWL   -f/u 24 hour urine. Patient may benefit from urinary alkalinization as his UA today pH of 6.0.

## 2015-01-02 NOTE — Discharge Instructions (Signed)
See [facility] discharge instructions in chart.  

## 2015-01-02 NOTE — Op Note (Signed)
See [facility] OP note scanned into chart. Also because of the size, density, location and other factors that cannot be anticipated I feel this will likely be a staged procedure. This fact supersedes any indication in the scanned [facility] operative note to the contrary.  

## 2015-01-10 ENCOUNTER — Ambulatory Visit (INDEPENDENT_AMBULATORY_CARE_PROVIDER_SITE_OTHER): Payer: Medicare Other | Admitting: Family Medicine

## 2015-01-10 ENCOUNTER — Encounter: Payer: Self-pay | Admitting: Family Medicine

## 2015-01-10 VITALS — BP 120/80 | HR 57 | Temp 97.7°F | Ht 73.0 in | Wt 261.5 lb

## 2015-01-10 DIAGNOSIS — Z0000 Encounter for general adult medical examination without abnormal findings: Secondary | ICD-10-CM | POA: Diagnosis not present

## 2015-01-10 DIAGNOSIS — J449 Chronic obstructive pulmonary disease, unspecified: Secondary | ICD-10-CM | POA: Diagnosis not present

## 2015-01-10 DIAGNOSIS — E7800 Pure hypercholesterolemia, unspecified: Secondary | ICD-10-CM | POA: Diagnosis not present

## 2015-01-10 DIAGNOSIS — I10 Essential (primary) hypertension: Secondary | ICD-10-CM

## 2015-01-10 DIAGNOSIS — R0609 Other forms of dyspnea: Secondary | ICD-10-CM

## 2015-01-10 DIAGNOSIS — R001 Bradycardia, unspecified: Secondary | ICD-10-CM | POA: Diagnosis not present

## 2015-01-10 DIAGNOSIS — Z7189 Other specified counseling: Secondary | ICD-10-CM

## 2015-01-10 NOTE — Assessment & Plan Note (Signed)
At goal on no med. Encouraged exercise, weight loss, healthy eating habits.

## 2015-01-10 NOTE — Assessment & Plan Note (Signed)
Reviewed.Pt in process of setting up living will. (reviewed 2016)

## 2015-01-10 NOTE — Assessment & Plan Note (Signed)
EKG unremarkable (no clear past MI),  pt asymptomatic except DOE which may be due to past tobacco use and mild COPD. Not on  medications that lower heart rate.  No improvement with treatment of thyroid.  Will refer for stress ECHO to eval heart function.

## 2015-01-10 NOTE — Progress Notes (Signed)
Pre visit review using our clinic review tool, if applicable. No additional management support is needed unless otherwise documented below in the visit note. 

## 2015-01-10 NOTE — Assessment & Plan Note (Signed)
Well controlled. Continue current medication. HCTZ

## 2015-01-10 NOTE — Patient Instructions (Signed)
Stop at front desk to set up stress echo to evaluate heart rate and shortness of breath.  Keep up great work on healthy eating, regular exercise and weight loss.

## 2015-01-10 NOTE — Progress Notes (Signed)
I have personally reviewed the Medicare Annual Wellness questionnaire and have noted 1. The patient's medical and social history 2. Their use of alcohol, tobacco or illicit drugs 3. Their current medications and supplements 4. The patient's functional ability including ADL's, fall risks, home safety risks and hearing or visual             impairment. 5. Diet and physical activities 6. Evidence for depression or mood disorders 7.         Updated provider list Cognitive evaluation was performed and recorded on pt medicare questionnaire form. The patients weight, height, BMI and visual acuity have been recorded in the chart  I have made referrals, counseling and provided education to the patient based review of the above and I have provided the pt with a written personalized care plan for preventive services.   During lithotripsy.Marland Kitchen Heart rate went down to 32-33, they stopped procedure. HR went up to 48. He typically has lower heart rates ( noted ever since 2012).. Baseline years... [age]. Asymptomatic. Did not improve once thyroid issue diagnosed and treated. No palpitations.  No meds that cause bradycardia.   Hypertension: Well controlled on HCTZ  BP Readings from Last 3 Encounters:  01/10/15 120/80  01/02/15 158/89  10/03/14 144/81  Using medication without problems or lightheadedness:  None Chest pain with exertion: None  Edema:yes, improves at end of the day. Short of breath: with exertion at times ( outside, going up stairs), gradually worsening but mild.. Feels it is because he is heavier.  Average home BPs: At home 130/70s, rarely > 140/90 with coffee  Elevated Cholesterol:  Well controlled on diet.  LDL at oal < 130 Lab Results  Component Value Date   CHOL 152 12/30/2014   HDL 53.70 12/30/2014   LDLCALC 83 12/30/2014   TRIG 75.0 12/30/2014   CHOLHDL 3 12/30/2014   Health eating habits.  Wt Readings from Last 3 Encounters:  01/10/15 261 lb 8 oz (118.616 kg)   01/02/15 262 lb 3 oz (118.927 kg)  10/03/14 262 lb 8 oz (119.069 kg)    gym 3 times a week, treadmill, weights. Playing golf.  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 Followed by Dr. Loanne Drilling.  Lab Results  Component Value Date   TSH 3.62 09/30/2014   Elevated PSA: Followed by Dr. Janice Norrie, Mild nocturia but only once a night.  On hytrin, stopped avodart.  No current urinary issue, no flow issue, does urinate 3 times at night.   Gout: On allopurinol, uric acid well controlled at 5.0. No recent gout flares or new stones. Has uric acid stone.. Had recent lithotripsy.  Prediabetes: resolved.  Review of Systems  Constitutional: Negative for fever, fatigue and unexpected weight change.  HENT: Negative for ear pain, sore throat, rhinorrhea, trouble swallowing and has noted phlegm in troat, clearing throat at noght and in morning off and on in last year, more persistant in last 2 months. No sneeze, no itchy eyes.  Eyes: Negative for pain.  Respiratory: Negative for cough, shortness of breath and wheezing.  Cardiovascular: Negative for chest pain, palpitations and leg swelling.  Gastrointestinal: Negative for nausea, abdominal pain, diarrhea, constipation and blood in stool.  Genitourinary: Negative for dysuria, urgency, hematuria, discharge, penile swelling, scrotal swelling, difficulty urinating, penile pain and testicular pain.  ED  Skin: Negative for rash.  Neurological: Negative for syncope, weakness, light-headedness, numbness and headaches.  Psychiatric/Behavioral: Negative for behavioral problems and dysphoric mood. The patient is not nervous/anxious.  MSK: sore and stiff  in right ankle after playing golf. Some swelling and pain medically , no injury. No changes in shoes or activity.  Objective:   Physical Exam  Constitutional: He appears well-developed and well-nourished. Non-toxic appearance. He does not appear ill. No distress.  HENT:  Head: Normocephalic and  atraumatic.  Right Ear: Hearing, tympanic membrane, external ear and ear canal normal.  Left Ear: Hearing, tympanic membrane, external ear and ear canal normal.  Nose: Nasal turbinate swelling , mildly pale.  Mouth/Throat: Uvula is midline, oropharynx is clear and moist and mucous membranes are normal.  Eyes: Conjunctivae, EOM and lids are normal. Pupils are equal, round, and reactive to light. No foreign bodies found.  Neck: Trachea normal, normal range of motion and phonation normal. Neck supple. Carotid bruit is not present. No mass and no thyromegaly present.  Cardiovascular: Bradycardia, regular rhythm, S1 normal, S2 normal, intact distal pulses and normal pulses. Exam reveals no gallop.  No murmur heard.  Pulmonary/Chest: Breath sounds normal. He has no wheezes. He has no rhonchi. He has no rales.  Abdominal: Soft. Normal appearance and bowel sounds are normal. There is no hepatosplenomegaly. There is no tenderness. There is no rebound, no guarding and no CVA tenderness. No hernia.  Lymphadenopathy:  He has no cervical adenopathy.  Neurological: He is alert. He has normal strength and normal reflexes. No cranial nerve deficit or sensory deficit. Gait normal.  Skin: Skin is warm, dry and intact. No rash noted.  Psychiatric: He has a normal mood and affect. His speech is normal and behavior is normal. Judgment normal.  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 Callus at left lateral foot over distal MCP joint, mild ttp. Assessment & Plan:   Annual Medicare Wellness Exam The patient's preventative maintenance and recommended screening tests for an annual wellness exam were reviewed in full today.  Brought up to date unless services declined.  Counselled on the importance of diet, exercise, and its role in overall health and mortality.  The patient's FH and SH was reviewed,  including their home life, tobacco status, and drug and alcohol status.   Last colonoscopy in 2007.Marland Kitchen Repeat in 10 years.  PSA, prostate per Dr. Pilar Jarvis.  Up to Date with PNA vaccine, flu, shingles, and Tdap, prevnar  And flu. Former smoker>60 pack year history.. CXR 2014 nml.  Quit more than 15 years ago Baby ASA for prevention.

## 2015-01-23 DIAGNOSIS — N200 Calculus of kidney: Secondary | ICD-10-CM | POA: Diagnosis not present

## 2015-01-29 ENCOUNTER — Other Ambulatory Visit: Payer: Self-pay | Admitting: Endocrinology

## 2015-01-29 ENCOUNTER — Other Ambulatory Visit: Payer: Self-pay | Admitting: Family Medicine

## 2015-01-31 ENCOUNTER — Other Ambulatory Visit: Payer: Medicare Other

## 2015-02-20 ENCOUNTER — Encounter: Payer: Self-pay | Admitting: *Deleted

## 2015-02-20 ENCOUNTER — Other Ambulatory Visit: Payer: Medicare Other

## 2015-02-25 DIAGNOSIS — N401 Enlarged prostate with lower urinary tract symptoms: Secondary | ICD-10-CM | POA: Diagnosis not present

## 2015-03-05 ENCOUNTER — Other Ambulatory Visit: Payer: Medicare Other

## 2015-03-10 DIAGNOSIS — Z0000 Encounter for general adult medical examination without abnormal findings: Secondary | ICD-10-CM | POA: Diagnosis not present

## 2015-03-10 DIAGNOSIS — N202 Calculus of kidney with calculus of ureter: Secondary | ICD-10-CM | POA: Diagnosis not present

## 2015-03-19 ENCOUNTER — Ambulatory Visit (INDEPENDENT_AMBULATORY_CARE_PROVIDER_SITE_OTHER): Payer: Medicare Other

## 2015-03-19 ENCOUNTER — Encounter: Payer: Self-pay | Admitting: Family Medicine

## 2015-03-19 DIAGNOSIS — R0609 Other forms of dyspnea: Secondary | ICD-10-CM

## 2015-03-19 DIAGNOSIS — R001 Bradycardia, unspecified: Secondary | ICD-10-CM

## 2015-03-19 LAB — ECHOCARDIOGRAM STRESS TEST
CHL CUP RESTING HR STRESS: 61 {beats}/min
CHL CUP STRESS STAGE 1 GRADE: 0 %
CHL CUP STRESS STAGE 2 GRADE: 0 %
CHL CUP STRESS STAGE 2 SPEED: 0 mph
CHL CUP STRESS STAGE 3 GRADE: 0 %
CHL CUP STRESS STAGE 3 HR: 57 {beats}/min
CHL CUP STRESS STAGE 4 HR: 111 {beats}/min
CHL CUP STRESS STAGE 5 SPEED: 2.5 mph
CHL CUP STRESS STAGE 6 GRADE: 14 %
CHL CUP STRESS STAGE 6 SPEED: 3.4 mph
CHL CUP STRESS STAGE 7 GRADE: 0 %
CHL CUP STRESS STAGE 7 HR: 139 {beats}/min
CHL CUP STRESS STAGE 8 SBP: 175 mmHg
CHL CUP STRESS STAGE 8 SPEED: 0 mph
CSEPED: 7 min
CSEPEDS: 31 s
CSEPPMHR: 91 %
Estimated workload: 9.3 METS
MPHR: 146 {beats}/min
Peak HR: 134 {beats}/min
Percent HR: 106 %
Stage 1 DBP: 112 mmHg
Stage 1 HR: 56 {beats}/min
Stage 1 SBP: 168 mmHg
Stage 1 Speed: 0 mph
Stage 2 DBP: 98 mmHg
Stage 2 HR: 58 {beats}/min
Stage 2 SBP: 144 mmHg
Stage 3 Speed: 0 mph
Stage 4 Grade: 10 %
Stage 4 Speed: 1.7 mph
Stage 5 DBP: 99 mmHg
Stage 5 Grade: 12 %
Stage 5 HR: 125 {beats}/min
Stage 5 SBP: 189 mmHg
Stage 6 HR: 134 {beats}/min
Stage 7 Speed: 0 mph
Stage 8 DBP: 105 mmHg
Stage 8 Grade: 0 %
Stage 8 HR: 76 {beats}/min

## 2015-03-20 ENCOUNTER — Encounter: Payer: Self-pay | Admitting: Family Medicine

## 2015-03-21 ENCOUNTER — Telehealth: Payer: Self-pay | Admitting: Family Medicine

## 2015-03-21 DIAGNOSIS — Z1211 Encounter for screening for malignant neoplasm of colon: Secondary | ICD-10-CM

## 2015-03-21 NOTE — Telephone Encounter (Signed)
Pt sent my chart  To get a colonscopy scheduled   See below message Appointment Request From: Wilber Bihari       With Provider: Eliezer Lofts, MD [facility] [place]      Preferred Date Range: Any date 03/20/2015 or later      Reason: To address the following health maintenance concerns.   Colonoscopy      Comments:   Please schedule for me after February 8th, except for February 17th. I prefer early appointments. Also in the past this has been a problem test and was only successful when done with an internal miniature camera taking pictures of the colon. Thank you,   Josph Macho

## 2015-03-24 ENCOUNTER — Encounter: Payer: Self-pay | Admitting: Gastroenterology

## 2015-04-02 ENCOUNTER — Ambulatory Visit (INDEPENDENT_AMBULATORY_CARE_PROVIDER_SITE_OTHER): Payer: Medicare Other | Admitting: Endocrinology

## 2015-04-02 ENCOUNTER — Encounter: Payer: Self-pay | Admitting: Gastroenterology

## 2015-04-02 ENCOUNTER — Encounter: Payer: Self-pay | Admitting: Endocrinology

## 2015-04-02 VITALS — BP 122/78 | HR 44 | Temp 97.8°F | Ht 73.0 in | Wt 272.0 lb

## 2015-04-02 DIAGNOSIS — E890 Postprocedural hypothyroidism: Secondary | ICD-10-CM | POA: Diagnosis not present

## 2015-04-02 LAB — TSH: TSH: 10.76 u[IU]/mL — AB (ref 0.35–4.50)

## 2015-04-02 MED ORDER — LEVOTHYROXINE SODIUM 125 MCG PO TABS: 125.0000 ug | ORAL_TABLET | Freq: Every day | ORAL | Status: DC

## 2015-04-02 NOTE — Patient Instructions (Addendum)
A thyroid blood test is requested for you today.  We'll contact you with results.   Please come back for a follow-up appointment in 6-12 months.   most of the time, a "lumpy thyroid" will eventually become overactive again.  this is usually a slow process, happening over the span of many years.

## 2015-04-02 NOTE — Progress Notes (Signed)
Subjective:    Patient ID: Jason Solomon, male    DOB: 02-19-1941, 75 y.o.   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 pt states he feels well in general.  He denies any swelling t the anterior neck Past Medical History  Diagnosis Date  . DIVERTICULOSIS, COLON 01/12/2007  . GOUT 01/12/2007  . HYPERCHOLESTEROLEMIA 06/20/2007  . 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 OBESITY 01/12/2007  . HYPERTENSION 01/12/2007  . PROSTATE SPECIFIC ANTIGEN, ELEVATED 10/08/2009  . HYPERTHYROIDISM 04/24/2010  . GOITER, MULTINODULAR 05/04/2010  . 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 Renal calculi   . Heart murmur     childhood only  . Shortness of breath dyspnea     with exertion  . GERD (gastroesophageal reflux disease)     Past Surgical History  Procedure Laterality Date  . Hernia repair  1990's    right and left inguinal hernia  . Knee surgery  1990's  . Hand surgery  1949    left hand laceration  . Tonsillectomy  1945  . Cardiovascular stress test  2006    negative    Social History   Social History  . Marital Status: Married    Spouse Name: N/A  . Number of Children: 4  . Years of Education: N/A   Occupational History  .      Retired   Social History Main Topics  . Smoking status: Former Smoker    Quit date: 02/23/1988  . Smokeless tobacco: Never Used  . Alcohol Use: 0.0 oz/week    0 Standard drinks or equivalent per week     Comment: moderate-1 glass of wine daily, occ. scotch  . Drug Use: No  . Sexual Activity: Not on file   Other Topics Concern  . Not on file   Social History Narrative   Regular exercise-yes, walks , golf   Occupation: Retired Hydrographic surveyor   Diet: (+) fruit, (+) veggies, (+) salad, (+) H20   Children: 4, healthy   Married x 43 years   Living will, [place], daughter. Full code. (reviewed 2013)    Current Outpatient Prescriptions on File Prior to Visit  Medication Sig Dispense Refill  . allopurinol (ZYLOPRIM) 300 MG tablet Take 1 tablet by mouth  daily 90 tablet 3  . hydrochlorothiazide (HYDRODIURIL) 25 MG tablet Take 1 tablet by mouth  daily 90 tablet 3  . mometasone (NASONEX) 50 MCG/ACT nasal spray Place 2 sprays into the nose as needed. (Patient taking differently: Place 2 sprays into the nose as needed (when flying). ) 17 g 0  . montelukast (SINGULAIR) 10 MG tablet Take 1 tablet (10 mg total) by mouth daily as needed. Only when flying 30 tablet 0  . sildenafil (VIAGRA) 100 MG tablet Take 1 tablet (100 mg total) by mouth daily as needed for erectile dysfunction. 10 tablet 0  . terazosin (HYTRIN) 5 MG capsule TAKE 1 CAPSULE DAILY 90 capsule 3   No current facility-administered medications on file prior to visit.    Allergies  Allergen Reactions  . Ace Inhibitors     REACTION: ?angioedema  . Angiotensin Receptor Blockers     REACTION: angioedema on ACE?    Family History  Problem Relation Age  of Onset  . Hypertension Mother   . COPD Brother   . Cancer Brother 40    lung cancer  . Thyroid disease Neg Hx   . Cancer Brother     Throat Cancer    BP 122/78 mmHg  Pulse 44  Temp(Src) 97.8 F (36.6 C) (Oral)  Ht 6\' 1"  (1.854 m)  Wt 272 lb (123.378 kg)  BMI 35.89 kg/m2  SpO2 96%   Review of Systems No change in chronic edema    Objective:   Physical Exam VITAL SIGNS:  See vs page GENERAL: no distress NECK: There is no palpable thyroid enlargement.  No thyroid nodule is palpable.  No palpable lymphadenopathy at the anterior neck.   Lab Results  Component Value Date   TSH 10.76* 04/02/2015       Assessment & Plan:  Post-I-131 hypothyroidism.  He needs increased rx.   Patient is advised the following: Patient Instructions  A thyroid  blood test is requested for you today.  We'll contact you with results.   Please come back for a follow-up appointment in 6-12 months.   most of the time, a "lumpy thyroid" will eventually become overactive again.  this is usually a slow process, happening over the span of many years.      Addendum: i have sent a prescription to your pharmacy, to increase the synthroid

## 2015-04-03 ENCOUNTER — Telehealth: Payer: Self-pay | Admitting: Family Medicine

## 2015-04-03 NOTE — Telephone Encounter (Signed)
Yes, colonoscopy is in X-ray section of chart. It does say procedure was stopped to recurrent looping of bowel and concern for risk of perforation. Paper chart in my outbox.  Please fax that to GI and scan as well as the barium enema he also had.  Let pt know.

## 2015-04-03 NOTE — Telephone Encounter (Signed)
Pt is scheduled to see Dr. Luvenia Starch 05/13/15 at [facility] to discuss colonoscpoy. When I was talking with pt, he stated all of his records were sent here from [place] years ago. He had issues with previous colonoscopy before (only successful when done with an internal miniature camera taking pictures of the colon).  I told him that I did not see his previous GI records and that it could be in a paper chart in storage. He was concerned about this so I have requested paper chart. He has called the GI office in [place] and that doctor has since retired and records are no longer available.   Can you please look through paper chart to see if you see any prior GI records. The paper chart is in Dr. Diona Browner IN box.

## 2015-04-04 NOTE — Telephone Encounter (Signed)
Documentation faxed to [facility]. Pt is aware.

## 2015-05-06 DIAGNOSIS — N200 Calculus of kidney: Secondary | ICD-10-CM | POA: Diagnosis not present

## 2015-05-07 DIAGNOSIS — N200 Calculus of kidney: Secondary | ICD-10-CM | POA: Diagnosis not present

## 2015-05-12 ENCOUNTER — Other Ambulatory Visit (INDEPENDENT_AMBULATORY_CARE_PROVIDER_SITE_OTHER): Payer: Medicare Other

## 2015-05-12 DIAGNOSIS — E890 Postprocedural hypothyroidism: Secondary | ICD-10-CM | POA: Diagnosis not present

## 2015-05-12 LAB — TSH: TSH: 4.55 u[IU]/mL — AB (ref 0.35–4.50)

## 2015-05-13 ENCOUNTER — Encounter: Payer: Self-pay | Admitting: Gastroenterology

## 2015-05-13 ENCOUNTER — Ambulatory Visit (INDEPENDENT_AMBULATORY_CARE_PROVIDER_SITE_OTHER): Payer: Medicare Other | Admitting: Gastroenterology

## 2015-05-13 VITALS — BP 128/76 | HR 52 | Ht 73.0 in | Wt 270.1 lb

## 2015-05-13 DIAGNOSIS — K5900 Constipation, unspecified: Secondary | ICD-10-CM

## 2015-05-13 DIAGNOSIS — Z1211 Encounter for screening for malignant neoplasm of colon: Secondary | ICD-10-CM

## 2015-05-13 MED ORDER — POLYETHYLENE GLYCOL 3350 17 GM/SCOOP PO POWD: ORAL | Status: DC

## 2015-05-13 NOTE — Progress Notes (Signed)
HPI :  75 y/o male here for evaluation for colon cancer screening. He has a PMH of obesity, HTN.  His last colon cancer screening was a colonoscopy done in 2004, in which he had an incomplete colonoscopy, the procedure was aborted in the transverse colon. In reviewing this report, he had a very redundant colon with looping and the procedure could not be completed. He had a subsequent barium enema which was normal at the time. He has some mild constipation since being on calcium supplements, trying to drink prune juice to help with this but it doesn't help too much. He is having one BM every other day. He has some straining and passes hard stools. He is color blind but thinks he sees some blood on the toilet paper with wipes, endorses history of hemorrhoids which he thinks is related, but no blood in the stools or toilet otherwise. No chronic abdominal pains. He has a history of bilateral inguinal hernia repair. No FH of CRC.   Colonoscopy 10/2002 - one small polyp, no path available, could not traverse the transverse colon. Follow up barium enema was normal other than diverticulosis  Past Medical History  Diagnosis Date  . DIVERTICULOSIS, COLON 01/12/2007  . GOUT 01/12/2007  . HYPERCHOLESTEROLEMIA 06/20/2007  . 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 OBESITY 01/12/2007  . HYPERTENSION 01/12/2007  . PROSTATE SPECIFIC ANTIGEN, ELEVATED 10/08/2009  . HYPERTHYROIDISM 04/24/2010  . GOITER, MULTINODULAR 05/04/2010  . 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 Renal calculi   . Heart murmur     childhood only  . Shortness of breath dyspnea     with exertion  . GERD (gastroesophageal reflux disease)      Past Surgical History  Procedure Laterality Date  . Hernia repair  1990's    right and left inguinal hernia  . Knee surgery  1990's  . Hand surgery  1949    left hand laceration  . Tonsillectomy  1945  . Cardiovascular stress test  2006    negative   Family History  Problem  Relation Age of Onset  . Hypertension Mother   . COPD Brother   . Lung cancer Brother 63  . Thyroid disease Neg Hx   . Throat cancer Brother    Social History  Substance Use Topics  . Smoking status: Former Smoker    Quit date: 02/23/1988  . Smokeless tobacco: Never Used  . Alcohol Use: 0.0 oz/week    0 Standard drinks or equivalent per week     Comment: moderate-1 glass of wine daily, occ. scotch   Current Outpatient Prescriptions  Medication Sig Dispense Refill  . allopurinol (ZYLOPRIM) 300 MG tablet Take 1 tablet by mouth  daily 90 tablet 3  . Calcium Carb-Cholecalciferol (CALCIUM 1000 + D PO) Take by mouth.    . hydrochlorothiazide (HYDRODIURIL) 25 MG tablet Take 1 tablet by mouth  daily 90 tablet 3  . levothyroxine (SYNTHROID, LEVOTHROID) 125 MCG tablet Take 1 tablet (125 mcg total) by mouth daily before breakfast. 90 tablet 3  . mometasone (NASONEX) 50 MCG/ACT nasal spray Place 2 sprays into the nose as needed. (Patient taking differently: Place 2 sprays into the nose as needed (when flying). ) 17 g 0  . montelukast (SINGULAIR) 10 MG tablet Take 1 tablet (10 mg total) by mouth daily as needed. Only when flying 30 tablet 0  . sildenafil (VIAGRA) 100 MG tablet Take 1 tablet (100 mg total) by mouth daily as needed  for erectile dysfunction. 10 tablet 0  . terazosin (HYTRIN) 5 MG capsule TAKE 1 CAPSULE DAILY 90 capsule 3  . polyethylene glycol powder (GLYCOLAX/MIRALAX) powder Mix 17 g in 8 oz of water daily 255 g 3   No current facility-administered medications for this visit.   Allergies  Allergen Reactions  . Ace Inhibitors     REACTION: ?angioedema  . Angiotensin Receptor Blockers     REACTION: angioedema on ACE?     Review of Systems: All systems reviewed and negative except where noted in HPI.   No results found for: WBC, HGB, HCT, MCV, PLT  Lab Results  Component Value Date   CREATININE 1.09 12/30/2014   BUN 19 12/30/2014   NA 142 12/30/2014   K 4.0 12/30/2014     CL 105 12/30/2014   CO2 33* 12/30/2014    Lab Results  Component Value Date   ALT 11 12/30/2014   AST 19 12/30/2014   ALKPHOS 43 12/30/2014   BILITOT 1.0 12/30/2014     Physical Exam: BP 128/76 mmHg  Pulse 52  Ht 6\' 1"  (1.854 m)  Wt 270 lb 2 oz (122.528 kg)  BMI 35.65 kg/m2 Constitutional: Pleasant,well-developed, male in no acute distress. HEENT: Normocephalic and atraumatic. Conjunctivae are normal. No scleral icterus. Neck supple.  Cardiovascular: Normal rate, regular rhythm.  Pulmonary/chest: Effort normal and breath sounds normal. No wheezing, rales or rhonchi. Abdominal: Soft, protuberant, nontender. Bowel sounds active throughout. There are no masses palpable. No hepatomegaly. Extremities: trace edema B Lymphadenopathy: No cervical adenopathy noted. Neurological: Alert and oriented to person place and time. Skin: Skin is warm and dry. No rashes noted. Psychiatric: Normal mood and affect. Behavior is normal.   ASSESSMENT AND PLAN: 75 y/o male here for evaluation for CRC screening. He also has a history of constipation, possible medication induced.   I discussed options with him for colon cancer screening to include optical colonoscopy and stool testing. He has had a failed colonoscopy due to redundant colon in the past but had a polyp removed, small, without path avaliable. He wishes to avoid colonoscopy if possible, and in this light, after a discussion of options wishes to proceed with Cologuard. He understands that if this is positive we will be recommending optical colonoscopy. He agreed. I otherwise recommend a trial of miralax for constipation. He can start by using it once daily and titrate up as needed for effect. If this does not provide improvement he can follow up as needed. I otherwise will contact him with results of Cologuard.   Benson Cellar, MD [facility] Pager [phone]  CC: Jinny Sanders, MD

## 2015-05-13 NOTE — Patient Instructions (Signed)
We have sent your demographic and insurance information to [employer]. They should contact you within the next week regarding your Cologuard (colon cancer screening) test. If you have not heard from them within the next week, please call our office at [phone].  We have sent the following medications to your pharmacy for you to pick up at your convenience:Miralax.

## 2015-05-21 ENCOUNTER — Other Ambulatory Visit: Payer: Self-pay | Admitting: Urology

## 2015-05-29 ENCOUNTER — Encounter: Payer: Self-pay | Admitting: Gastroenterology

## 2015-06-09 DIAGNOSIS — Z0000 Encounter for general adult medical examination without abnormal findings: Secondary | ICD-10-CM | POA: Diagnosis not present

## 2015-06-09 DIAGNOSIS — N200 Calculus of kidney: Secondary | ICD-10-CM | POA: Diagnosis not present

## 2015-06-09 DIAGNOSIS — R972 Elevated prostate specific antigen [PSA]: Secondary | ICD-10-CM | POA: Diagnosis not present

## 2015-06-10 DIAGNOSIS — Z1211 Encounter for screening for malignant neoplasm of colon: Secondary | ICD-10-CM | POA: Diagnosis not present

## 2015-06-10 DIAGNOSIS — Z1212 Encounter for screening for malignant neoplasm of rectum: Secondary | ICD-10-CM | POA: Diagnosis not present

## 2015-06-11 ENCOUNTER — Encounter: Payer: Self-pay | Admitting: Family Medicine

## 2015-06-24 ENCOUNTER — Other Ambulatory Visit: Payer: Self-pay

## 2015-06-24 ENCOUNTER — Encounter: Payer: Self-pay | Admitting: Gastroenterology

## 2015-06-24 LAB — COLOGUARD: COLOGUARD: NEGATIVE

## 2015-06-25 ENCOUNTER — Encounter: Payer: Self-pay | Admitting: Gastroenterology

## 2015-06-25 ENCOUNTER — Encounter: Payer: Self-pay | Admitting: Family Medicine

## 2015-08-11 ENCOUNTER — Telehealth: Payer: Self-pay | Admitting: Family Medicine

## 2015-08-11 NOTE — Telephone Encounter (Addendum)
CPE 01/10/2015.  Let me know if Mr. Woodridge will need an office visit to complete the DMV forms.  Forms placed in Dr. Rometta Emery in box.

## 2015-08-11 NOTE — Telephone Encounter (Signed)
Pt dropped off DMV documentation to be filled out.  States he was told that this could be completed by PCP.  Told pt we would call with any questions. Placed ppw on D Loring desk for review.

## 2015-08-11 NOTE — Telephone Encounter (Signed)
Schedule an office visit with Dr. Diona Browner for this - complicated OV and forms.

## 2015-08-15 ENCOUNTER — Ambulatory Visit: Payer: Medicare Other | Admitting: Family Medicine

## 2015-08-19 DIAGNOSIS — Z7689 Persons encountering health services in other specified circumstances: Secondary | ICD-10-CM

## 2015-08-19 NOTE — Telephone Encounter (Signed)
DMV forms completed by Dr. Diona Browner.  Left message for Mr. Barreno that the forms are ready to be picked up at the front desk.  08/29/15 appointment cancelled.

## 2015-08-19 NOTE — Telephone Encounter (Signed)
Spoke with spouse regarding need for PCP appointment date and time per Dr. Lillie Fragmin instructions.  Scheduled appointment 08/29/15 @ 3:15 pm, first available 30 min slot.

## 2015-08-20 ENCOUNTER — Ambulatory Visit: Payer: Medicare Other | Admitting: Family Medicine

## 2015-08-29 ENCOUNTER — Ambulatory Visit: Payer: Medicare Other | Admitting: Family Medicine

## 2015-09-10 ENCOUNTER — Ambulatory Visit (INDEPENDENT_AMBULATORY_CARE_PROVIDER_SITE_OTHER): Payer: Medicare Other | Admitting: Primary Care

## 2015-09-10 ENCOUNTER — Encounter: Payer: Self-pay | Admitting: Primary Care

## 2015-09-10 VITALS — BP 134/76 | HR 50 | Temp 97.8°F | Ht 73.0 in | Wt 269.0 lb

## 2015-09-10 DIAGNOSIS — I10 Essential (primary) hypertension: Secondary | ICD-10-CM

## 2015-09-10 NOTE — Assessment & Plan Note (Signed)
Needs letter with today's BP reading and date. BP stable in office today. He is asymptomatic. Continue current regimen. Letter will be completed, printed, and faxed.

## 2015-09-10 NOTE — Progress Notes (Signed)
Pre visit review using our clinic review tool, if applicable. No additional management support is needed unless otherwise documented below in the visit note. 

## 2015-09-10 NOTE — Patient Instructions (Signed)
I will fax a letter to the [facility] with your current blood pressure reading and today's date.  Please follow up with them in 1 week to ensure they've received our fax.  It was a pleasure meeting you!

## 2015-09-10 NOTE — Progress Notes (Signed)
Subjective:    Patient ID: Jason Solomon, male    DOB: 12-19-1940, 75 y.o.   MRN: [id number]  HPI  Jason Solomon is a 75 year old male who presents today for evaluation of blood pressure. He has a history of essential hypertension and is currently managed on HCTZ 25 mg and Terazosin for BPH.  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 Denies dizziness, chest pain, visual changes. He checks his blood pressure at home with readings of 130's-140's/70-80's.    Review of Systems  Respiratory: Negative for shortness of breath.   Cardiovascular: Negative for chest pain and leg swelling.  Neurological: Negative for dizziness and headaches.       Past Medical History  Diagnosis Date  . DIVERTICULOSIS, COLON 01/12/2007  . GOUT 01/12/2007  . HYPERCHOLESTEROLEMIA 06/20/2007  . 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 OBESITY 01/12/2007  . HYPERTENSION 01/12/2007  . PROSTATE SPECIFIC ANTIGEN, ELEVATED 10/08/2009  . HYPERTHYROIDISM 04/24/2010  . GOITER, MULTINODULAR 05/04/2010  . 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 Renal calculi   . Heart murmur     childhood only  . Shortness of breath dyspnea     with exertion  . GERD (gastroesophageal reflux disease)      Social History   Social History  . Marital Status: Married    Spouse Name: N/A  . Number of Children: 4  . Years of Education: N/A   Occupational History  .      Retired   Social History Main Topics  . Smoking status: Former Smoker    Quit date: 02/23/1988  . Smokeless tobacco: Never Used  . Alcohol Use: 0.0 oz/week    0 Standard drinks or equivalent per week     Comment: moderate-1 glass of wine daily, occ. scotch  . Drug Use: No  . Sexual Activity: Not on file   Other Topics Concern  . Not on file   Social History Narrative   Regular exercise-yes, walks , golf   Occupation: Retired Hydrographic surveyor   Diet: (+) fruit, (+) veggies, (+) salad, (+) H20   Children: 4, healthy   Married x 43 years   Living will, [place], daughter. Full code. (reviewed 2013)    Past Surgical History  Procedure Laterality Date  . Hernia repair  1990's    right and left inguinal hernia  . Knee surgery  1990's  . Hand surgery  1949    left hand laceration  . Tonsillectomy  1945  . Cardiovascular stress test  2006    negative    Family History  Problem Relation Age of Onset  . Hypertension Mother   . COPD Brother   . Lung cancer Brother 7  . Thyroid disease Neg Hx   . Throat cancer Brother     Allergies  Allergen Reactions  . Ace Inhibitors     REACTION: ?angioedema  . Angiotensin Receptor Blockers     REACTION: angioedema on ACE?    Current Outpatient Prescriptions on File Prior to Visit  Medication Sig Dispense Refill  . allopurinol (ZYLOPRIM) 300 MG tablet Take 1 tablet by mouth  daily 90 tablet 3  . Calcium Carb-Cholecalciferol (CALCIUM 1000 + D PO) Take by mouth.    . hydrochlorothiazide (HYDRODIURIL) 25 MG tablet Take 1 tablet by mouth  daily 90 tablet 3  . levothyroxine (SYNTHROID, LEVOTHROID) 125 MCG tablet Take 1 tablet (125 mcg total) by mouth daily before breakfast. 90 tablet 3  . montelukast (SINGULAIR) 10 MG tablet Take 1 tablet (10 mg total) by mouth daily as needed. Only when flying 30 tablet 0  . polyethylene glycol powder (GLYCOLAX/MIRALAX) powder Mix 17 g in 8 oz of water daily (Patient taking differently: Take 0.5 Containers by mouth as needed. Mix 17 g in 8 oz of water daily) 255 g 3  . sildenafil (VIAGRA) 100 MG tablet Take 1 tablet (100 mg total) by mouth daily as needed for erectile dysfunction. 10 tablet 0  . terazosin (HYTRIN) 5 MG capsule TAKE 1 CAPSULE DAILY 90 capsule 3  . mometasone (NASONEX) 50 MCG/ACT nasal spray Place 2 sprays into the nose as needed. (Patient not taking: Reported on 09/10/2015) 17  g 0   No current facility-administered medications on file prior to visit.    BP 134/76 mmHg  Pulse 50  Temp(Src) 97.8 F (36.6 C) (Oral)  Ht 6\' 1"  (1.854 m)  Wt 269 lb (122.018 kg)  BMI 35.50 kg/m2  SpO2 97%    Objective:   Physical Exam  Constitutional: He appears well-nourished.  Neck: Neck supple.  Cardiovascular: Normal rate and regular rhythm.   Pulmonary/Chest: Effort normal and breath sounds normal.  Skin: Skin is warm and dry.          Assessment & Plan:

## 2015-09-11 ENCOUNTER — Encounter: Payer: Self-pay | Admitting: Primary Care

## 2015-10-07 ENCOUNTER — Encounter: Payer: Self-pay | Admitting: Endocrinology

## 2015-10-07 ENCOUNTER — Ambulatory Visit (INDEPENDENT_AMBULATORY_CARE_PROVIDER_SITE_OTHER): Payer: Medicare Other | Admitting: Endocrinology

## 2015-10-07 VITALS — BP 148/85 | HR 47 | Temp 98.5°F | Resp 16 | Ht 73.0 in | Wt 265.4 lb

## 2015-10-07 DIAGNOSIS — E890 Postprocedural hypothyroidism: Secondary | ICD-10-CM | POA: Diagnosis not present

## 2015-10-07 LAB — TSH: TSH: 6.52 u[IU]/mL — AB (ref 0.35–4.50)

## 2015-10-07 MED ORDER — LEVOTHYROXINE SODIUM 137 MCG PO TABS: 137.0000 ug | ORAL_TABLET | Freq: Every day | ORAL | 11 refills | Status: DC

## 2015-10-07 MED ORDER — LEVOTHYROXINE SODIUM 137 MCG PO TABS: 137.0000 ug | ORAL_TABLET | Freq: Every day | ORAL | 3 refills | Status: DC

## 2015-10-07 NOTE — Progress Notes (Signed)
Pre visit review using our clinic review tool, if applicable. No additional management support is needed unless otherwise documented below in the visit note. 

## 2015-10-07 NOTE — Progress Notes (Signed)
Subjective:    Patient ID: Jason Solomon, male    DOB: 30-Apr-1940, 75 y.o.   MRN: [id number]  HPI Pt returns for f/u of post-RAI hypothyroidism  (he had RAI for hyperthyroidism, due to small multinodular goiter, in late 2012; he has since had a fluctuating dosage requirement for synthroid; F/U US in 2014 showed significant shrinkage of the goiter).  pt states he feels well in general.  He denies any swelling at the anterior neck.   Past Medical History:  Diagnosis Date  . 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 DIVERTICULOSIS, COLON 01/12/2007  . 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 GERD (gastroesophageal reflux disease)   . GOITER, MULTINODULAR 05/04/2010  . GOUT 01/12/2007  . Heart murmur    childhood only  . HYPERCHOLESTEROLEMIA 06/20/2007  . HYPERTENSION 01/12/2007  . HYPERTHYROIDISM 04/24/2010  . OBESITY 01/12/2007  . PROSTATE SPECIFIC ANTIGEN, ELEVATED 10/08/2009  . Renal calculi   . Shortness of breath dyspnea    with exertion    Past Surgical History:  Procedure Laterality Date  . CARDIOVASCULAR STRESS TEST  2006   negative  . HAND SURGERY  1949   left hand laceration  . HERNIA REPAIR  1990's   right and left inguinal hernia  . KNEE SURGERY  1990's  . TONSILLECTOMY  1945    Social History   Social History  . Marital status: Married    Spouse name: N/A  . Number of children: 4  . Years of education: N/A   Occupational History  .  Retired    Retired   Social History Main Topics  . Smoking status: Former Smoker    Quit date: 02/23/1988  . Smokeless tobacco: Never Used  . Alcohol use 0.0 oz/week     Comment: moderate-1 glass of wine daily, occ. scotch  . Drug use: No  . Sexual activity: Not on file   Other Topics Concern  . Not on file   Social History Narrative   Regular exercise-yes, walks , golf   Occupation: Retired Hydrographic surveyor   Diet: (+) fruit, (+) veggies, (+) salad, (+) H20   Children: 4, healthy   Married x 43  years   Living will, [place], daughter. Full code. (reviewed 2013)    Current Outpatient Prescriptions on File Prior to Visit  Medication Sig Dispense Refill  . allopurinol (ZYLOPRIM) 300 MG tablet Take 1 tablet by mouth  daily 90 tablet 3  . Calcium Carb-Cholecalciferol (CALCIUM 1000 + D PO) Take by mouth.    . hydrochlorothiazide (HYDRODIURIL) 25 MG tablet Take 1 tablet by mouth  daily 90 tablet 3  . mometasone (NASONEX) 50 MCG/ACT nasal spray Place 2 sprays into the nose as needed. 17 g 0  . montelukast (SINGULAIR) 10 MG tablet Take 1 tablet (10 mg total) by mouth daily as needed. Only when flying 30 tablet 0  . polyethylene glycol powder (GLYCOLAX/MIRALAX) powder Mix 17 g in 8 oz of water daily (Patient taking differently: Take 0.5 Containers by mouth as needed. Mix 17 g in 8 oz of water daily) 255 g 3  . sildenafil (VIAGRA) 100 MG tablet Take 1 tablet (100 mg total) by mouth daily as needed for erectile dysfunction. 10 tablet 0  . terazosin (HYTRIN) 5 MG capsule TAKE 1 CAPSULE DAILY 90 capsule 3   No current facility-administered medications on file prior to visit.     Allergies  Allergen Reactions  . Ace Inhibitors  REACTION: ?angioedema  . Angiotensin Receptor Blockers     REACTION: angioedema on ACE?    Family History  Problem Relation Age of Onset  . Hypertension Mother   . COPD Brother   . Lung cancer Brother 37  . Thyroid disease Neg Hx   . Throat cancer Brother     BP (!) 148/85 (BP Location: Left Arm, Cuff Size: Large)   Pulse (!) 47 Comment: irregular  Temp 98.5 F (36.9 C) (Oral)   Resp 16   Ht 6\' 1"  (1.854 m)   Wt 265 lb 6.4 oz (120.4 kg)   BMI 35.02 kg/m    Review of Systems Denies dry skin.      Objective:   Physical Exam VITAL SIGNS:  See vs page GENERAL: no distress NECK: There is no palpable thyroid enlargement.  No thyroid nodule is palpable.  No palpable lymphadenopathy at the anterior neck.  Lab Results  Component  Value Date   TSH 6.52 (H) 10/07/2015      Assessment & Plan:  Post-RAI hypothyroidism.   He needs increased rx.

## 2015-10-07 NOTE — Patient Instructions (Signed)
A thyroid blood test is requested for you today.  We'll contact you with results.   Please come back for a follow-up appointment in 6-12 months.

## 2015-11-25 ENCOUNTER — Ambulatory Visit (INDEPENDENT_AMBULATORY_CARE_PROVIDER_SITE_OTHER): Payer: Medicare Other

## 2015-11-25 DIAGNOSIS — Z23 Encounter for immunization: Secondary | ICD-10-CM | POA: Diagnosis not present

## 2015-12-01 IMAGING — CR DG ABDOMEN 1V
2 series · 2 of 2 positions shown · non-contrast
Comparison: 11/19/2014

CLINICAL DATA: Pre lithotripsy for left renal stone

EXAM:
ABDOMEN - 1 VIEW

[t abdomen supine * (1 of 2)]
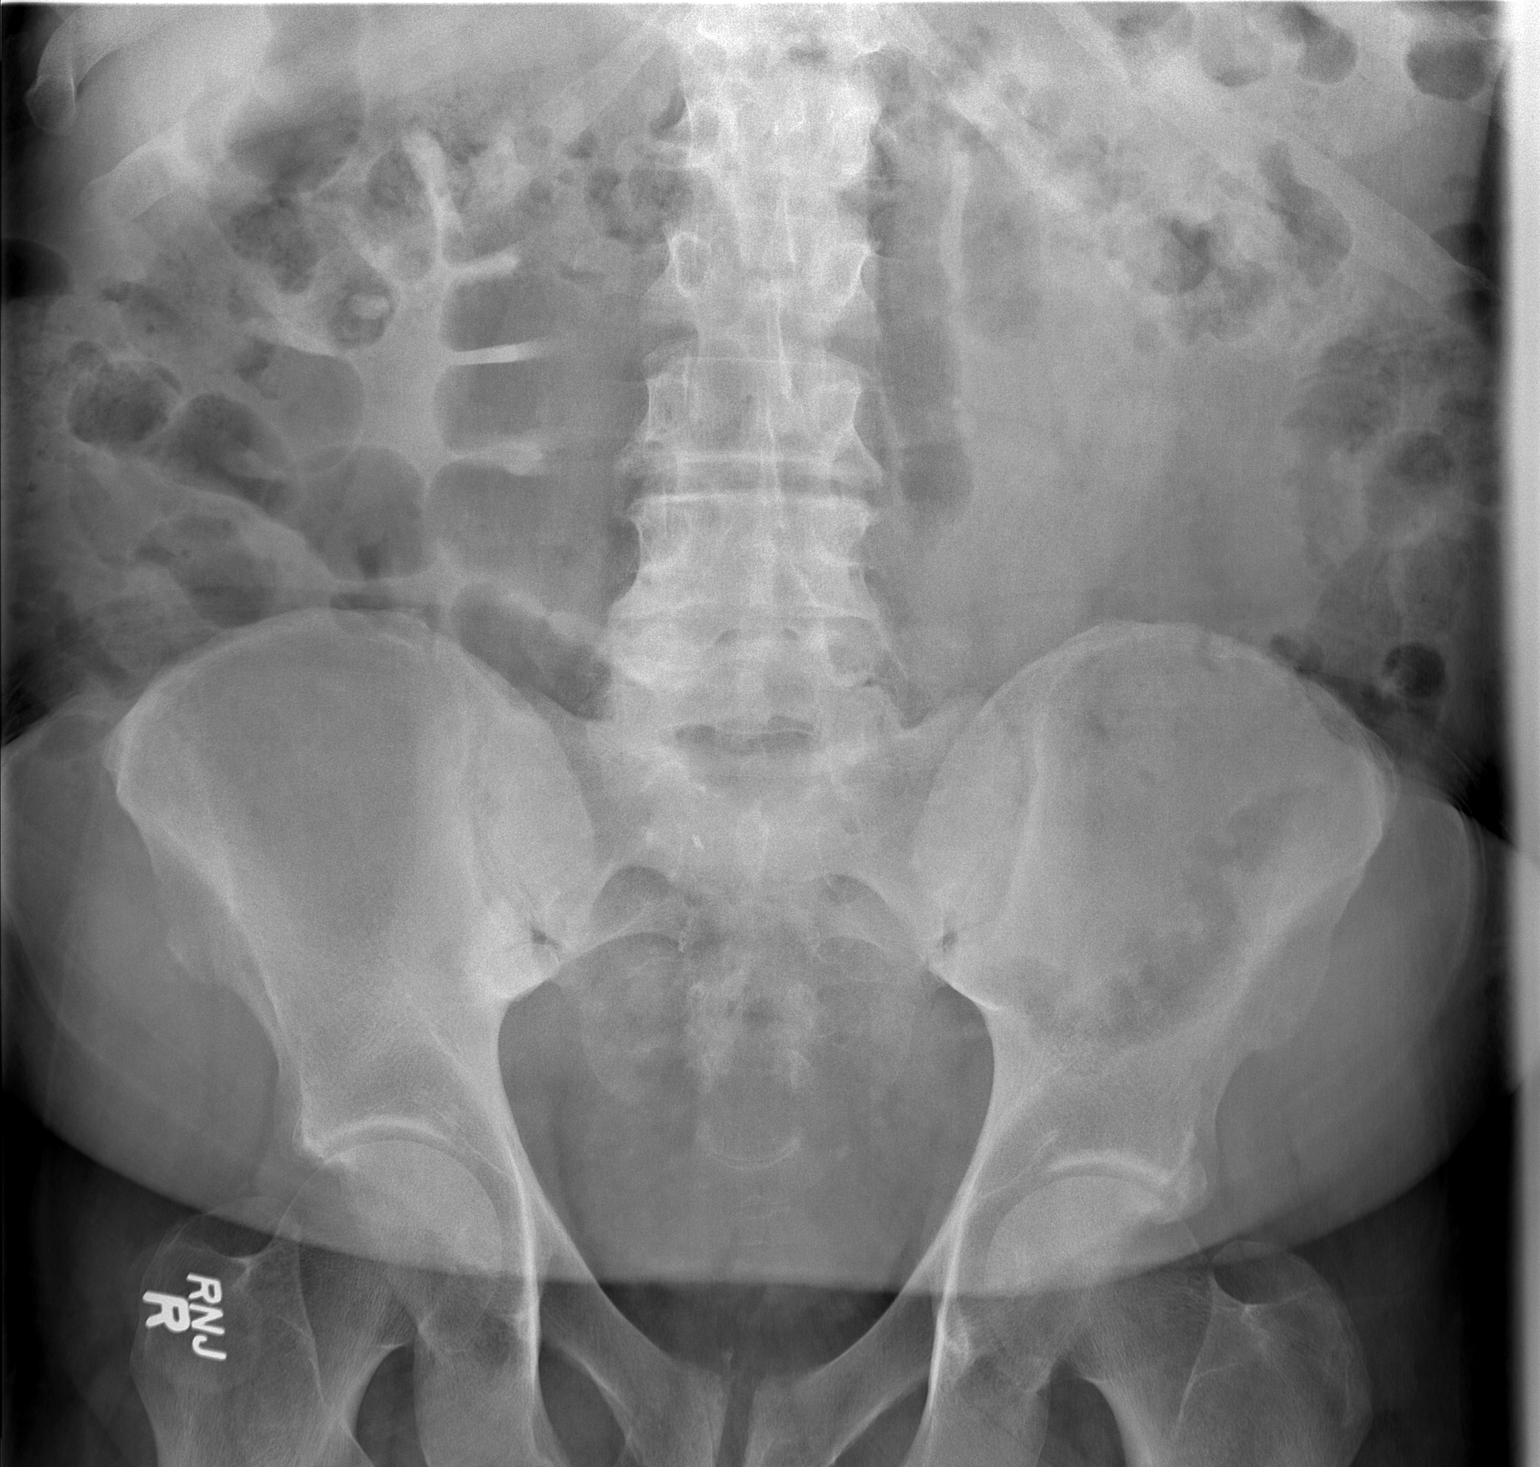

[t abdomen supine * (2 of 2)]
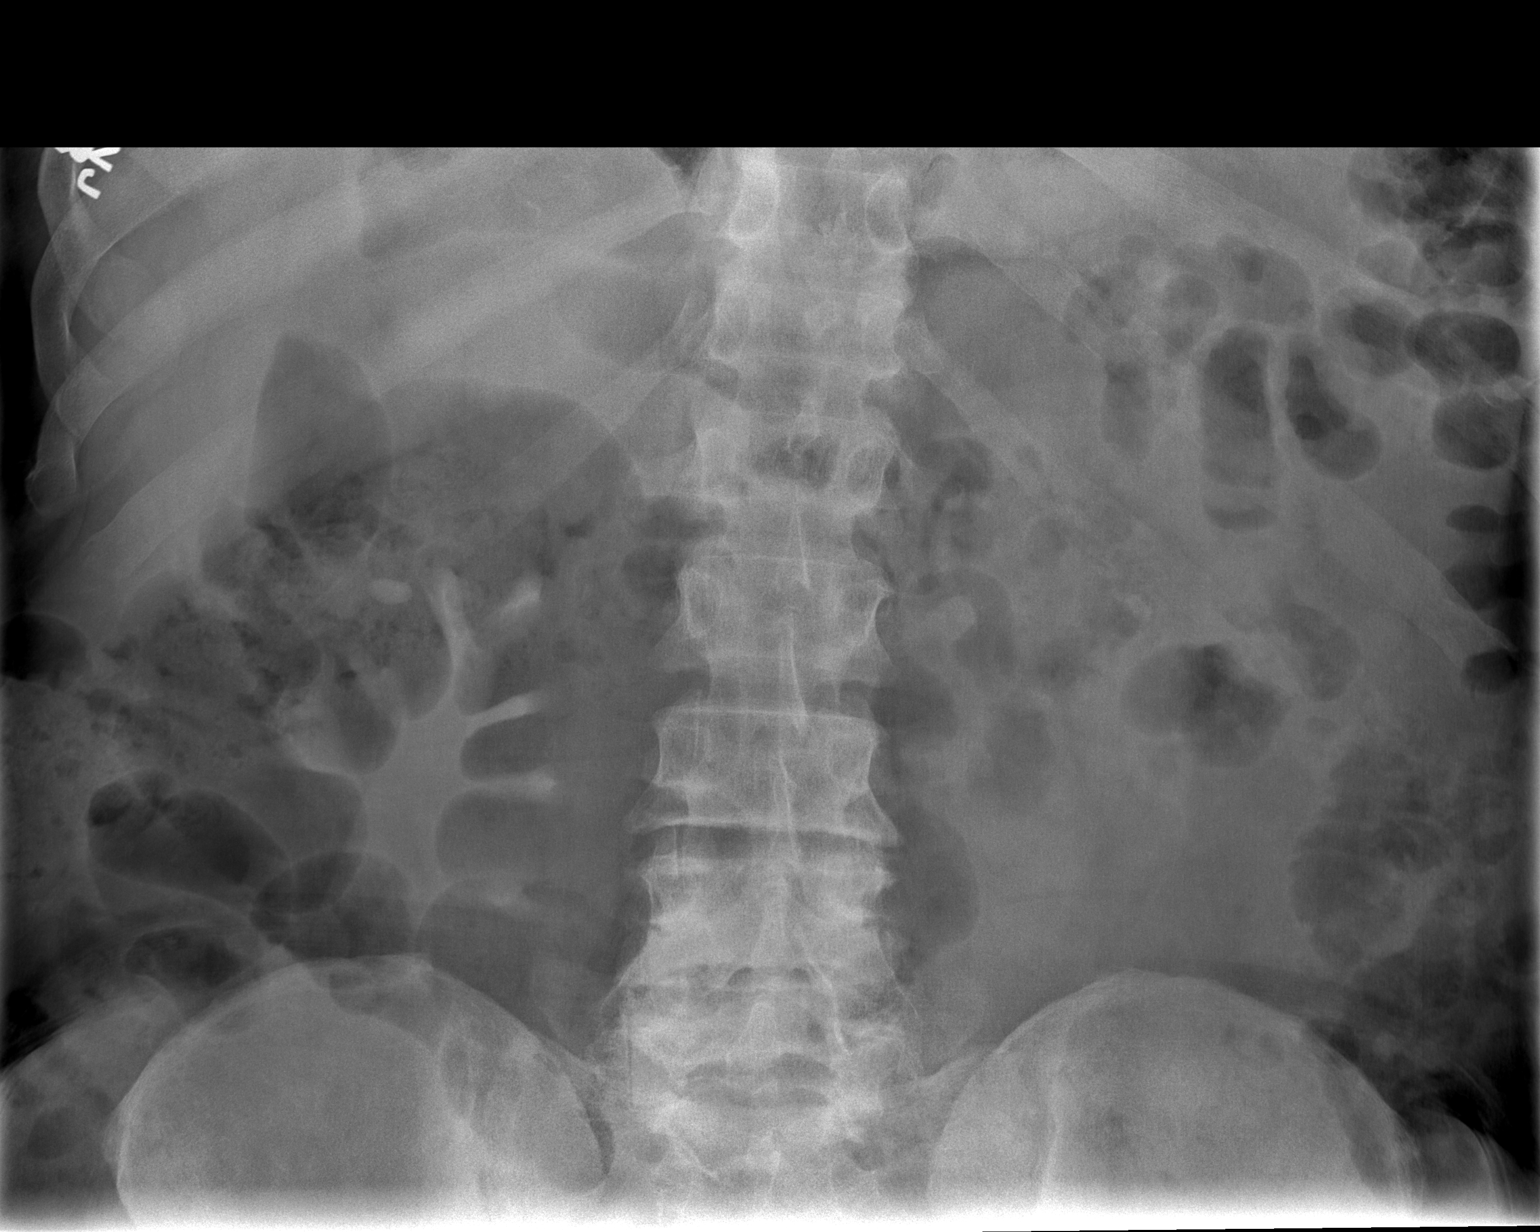

[2 of 2 positions shown; findings below may reference images not displayed]

FINDINGS: Bilateral ovoid renal calculi, 11 mm on the right and 9 mm on the
left, stable in positioning relative to previous study.

Stable pelvic calcifications, including 3 mm stone which is in the
region of the lower left ureter but is indeterminate.

Bowel gas pattern is nonobstructive. No concerning intra-abdominal
mass effect.
IMPRESSION: 1. Bilateral nephrolithiasis with stable appearance since
11/19/2014.
[DATE]. Indeterminate 3 mm left pelvic calcification.

## 2015-12-30 ENCOUNTER — Telehealth: Payer: Self-pay | Admitting: Family Medicine

## 2015-12-30 DIAGNOSIS — M1A00X0 Idiopathic chronic gout, unspecified site, without tophus (tophi): Secondary | ICD-10-CM

## 2015-12-30 DIAGNOSIS — R972 Elevated prostate specific antigen [PSA]: Secondary | ICD-10-CM

## 2015-12-30 DIAGNOSIS — E7800 Pure hypercholesterolemia, unspecified: Secondary | ICD-10-CM

## 2015-12-30 NOTE — Telephone Encounter (Signed)
-----   Message from Marchia Bond sent at 12/26/2015  3:19 PM EDT ----- Regarding: Cpx labs Mon 11/13 need orders. Thanks! :-) Please order  future cpx labs for pt's upcoming lab appt. Thanks Aniceto Boss

## 2016-01-03 ENCOUNTER — Other Ambulatory Visit: Payer: Self-pay | Admitting: Family Medicine

## 2016-01-06 ENCOUNTER — Other Ambulatory Visit: Payer: Medicare Other

## 2016-01-07 ENCOUNTER — Ambulatory Visit (INDEPENDENT_AMBULATORY_CARE_PROVIDER_SITE_OTHER): Payer: Medicare Other

## 2016-01-07 ENCOUNTER — Other Ambulatory Visit: Payer: Medicare Other

## 2016-01-07 VITALS — BP 110/78 | HR 55 | Temp 97.8°F | Ht 72.5 in | Wt 260.5 lb

## 2016-01-07 DIAGNOSIS — M1A00X0 Idiopathic chronic gout, unspecified site, without tophus (tophi): Secondary | ICD-10-CM

## 2016-01-07 DIAGNOSIS — E7800 Pure hypercholesterolemia, unspecified: Secondary | ICD-10-CM

## 2016-01-07 DIAGNOSIS — Z0000 Encounter for general adult medical examination without abnormal findings: Secondary | ICD-10-CM

## 2016-01-07 LAB — COMPREHENSIVE METABOLIC PANEL
ALBUMIN: 4 g/dL (ref 3.5–5.2)
ALT: 12 U/L (ref 0–53)
AST: 18 U/L (ref 0–37)
Alkaline Phosphatase: 44 U/L (ref 39–117)
BUN: 16 mg/dL (ref 6–23)
CALCIUM: 9.9 mg/dL (ref 8.4–10.5)
CHLORIDE: 103 meq/L (ref 96–112)
CO2: 34 mEq/L — ABNORMAL HIGH (ref 19–32)
CREATININE: 1.1 mg/dL (ref 0.40–1.50)
GFR: 83.9 mL/min (ref >60.00)
Glucose, Bld: 93 mg/dL (ref 70–99)
POTASSIUM: 4.4 meq/L (ref 3.5–5.1)
Sodium: 141 mEq/L (ref 135–145)
Total Bilirubin: 0.8 mg/dL (ref 0.2–1.2)
Total Protein: 7 g/dL (ref 6.0–8.3)

## 2016-01-07 LAB — LIPID PANEL
CHOLESTEROL: 160 mg/dL (ref 0–200)
HDL: 56.4 mg/dL (ref >39.00)
LDL CALC: 83 mg/dL (ref 0–99)
NonHDL: 103.12
TRIGLYCERIDES: 102 mg/dL (ref 0.0–149.0)
Total CHOL/HDL Ratio: 3
VLDL: 20.4 mg/dL (ref 0.0–40.0)

## 2016-01-07 LAB — URIC ACID: Uric Acid, Serum: 5 mg/dL (ref 4.0–7.8)

## 2016-01-07 NOTE — Progress Notes (Signed)
PCP notes:   Health maintenance:  No gaps identified. Health maintenance schedule reviewed with patient.   Abnormal screenings:   Hearing - failed  Patient concerns:   None  Nurse concerns:  None  Next PCP appt:   01/13/16 @ 0730

## 2016-01-07 NOTE — Patient Instructions (Signed)
Jason Solomon , Thank you for taking time to come for your Medicare Wellness Visit. I appreciate your ongoing commitment to your health goals. Please review the following plan we discussed and let me know if I can assist you in the future.   These are the goals we discussed: Goals    . Increase physical activity          Starting 01/07/2016, I will continue to exercise at 60 min 2-3 days per week.        This is a list of the screening recommended for you and due dates:  Health Maintenance  Topic Date Due  . Tetanus Vaccine  04/22/2016  . Cologuard (Stool DNA test)  06/10/2018  . Flu Shot  Completed  . Shingles Vaccine  Completed  . Pneumonia vaccines  Completed   Preventive Care for Adults  A healthy lifestyle and preventive care can promote health and wellness. Preventive health guidelines for adults include the following key practices.  . A routine yearly physical is a good way to check with your health care provider about your health and preventive screening. It is a chance to share any concerns and updates on your health and to receive a thorough exam.  . Visit your dentist for a routine exam and preventive care every 6 months. Brush your teeth twice a day and floss once a day. Good oral hygiene prevents tooth decay and gum disease.  . The frequency of eye exams is based on your age, health, family medical history, use  of contact lenses, and other factors. Follow your health care provider's ecommendations for frequency of eye exams.  . Eat a healthy diet. Foods like vegetables, fruits, whole grains, low-fat dairy products, and lean protein foods contain the nutrients you need without too many calories. Decrease your intake of foods high in solid fats, added sugars, and salt. Eat the right amount of calories for you. Get information about a proper diet from your health care provider, if necessary.  . Regular physical exercise is one of the most important things you can do for  your health. Most adults should get at least 150 minutes of moderate-intensity exercise (any activity that increases your heart rate and causes you to sweat) each week. In addition, most adults need muscle-strengthening exercises on 2 or more days a week.  Silver Sneakers may be a benefit available to you. To determine eligibility, you may visit the website: www.silversneakers.com or contact program at 269-840-3037 Mon-Fri between 8AM-8PM.   . Maintain a healthy weight. The body mass index (BMI) is a screening tool to identify possible weight problems. It provides an estimate of body fat based on height and weight. Your health care provider can find your BMI and can help you achieve or maintain a healthy weight.   For adults 20 years and older: ? A BMI below 18.5 is considered underweight. ? A BMI of 18.5 to 24.9 is normal. ? A BMI of 25 to 29.9 is considered overweight. ? A BMI of 30 and above is considered obese.   . Maintain normal blood lipids and cholesterol levels by exercising and minimizing your intake of saturated fat. Eat a balanced diet with plenty of fruit and vegetables. Blood tests for lipids and cholesterol should begin at age 51 and be repeated every 5 years. If your lipid or cholesterol levels are high, you are over 50, or you are at high risk for heart disease, you may need your cholesterol levels checked more frequently.  Ongoing high lipid and cholesterol levels should be treated with medicines if diet and exercise are not working.  . If you smoke, find out from your health care provider how to quit. If you do not use tobacco, please do not start.  . If you choose to drink alcohol, please do not consume more than 2 drinks per day. One drink is considered to be 12 ounces (355 mL) of beer, 5 ounces (148 mL) of wine, or 1.5 ounces (44 mL) of liquor.  . If you are 24-62 years old, ask your health care provider if you should take aspirin to prevent strokes.  . Use sunscreen.  Apply sunscreen liberally and repeatedly throughout the day. You should seek shade when your shadow is shorter than you. Protect yourself by wearing long sleeves, pants, a wide-brimmed hat, and sunglasses year round, whenever you are outdoors.  . Once a month, do a whole body skin exam, using a mirror to look at the skin on your back. Tell your health care provider of new moles, moles that have irregular borders, moles that are larger than a pencil eraser, or moles that have changed in shape or color.

## 2016-01-07 NOTE — Progress Notes (Signed)
Subjective:   Jason Solomon is a 75 y.o. male who presents for Medicare Annual/Subsequent preventive examination.  Review of Systems:  N/A Cardiac Risk Factors include: advanced age (>77men, >64 women);obesity (BMI >30kg/m2);male gender;hypertension;dyslipidemia     Objective:    Vitals: BP 110/78 (BP Location: Right Arm, Patient Position: Sitting, Cuff Size: Normal)   Pulse (!) 55   Temp 97.8 F (36.6 C) (Oral)   Ht 6' 0.5" (1.842 m) Comment: no shoes  Wt 260 lb 8 oz (118.2 kg)   SpO2 98%   BMI 34.84 kg/m   Body mass index is 34.84 kg/m.  Tobacco History  Smoking Status  . Former Smoker  . Quit date: 02/23/1988  Smokeless Tobacco  . Never Used     Counseling given: No   Past Medical History:  Diagnosis Date  . 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 DIVERTICULOSIS, COLON 01/12/2007  . 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 GERD (gastroesophageal reflux disease)   . GOITER, MULTINODULAR 05/04/2010  . GOUT 01/12/2007  . Heart murmur    childhood only  . HYPERCHOLESTEROLEMIA 06/20/2007  . HYPERTENSION 01/12/2007  . HYPERTHYROIDISM 04/24/2010  . OBESITY 01/12/2007  . PROSTATE SPECIFIC ANTIGEN, ELEVATED 10/08/2009  . Renal calculi   . Shortness of breath dyspnea    with exertion   Past Surgical History:  Procedure Laterality Date  . CARDIOVASCULAR STRESS TEST  2006   negative  . HAND SURGERY  1949   left hand laceration  . HERNIA REPAIR  1990's   right and left inguinal hernia  . KNEE SURGERY  1990's  . TONSILLECTOMY  1945   Family History  Problem Relation Age of Onset  . Hypertension Mother   . COPD Brother   . Lung cancer Brother 55  . Throat cancer Brother   . Thyroid disease Neg Hx    History  Sexual Activity  . Sexual activity: No    Outpatient Encounter Prescriptions as of 01/07/2016  Medication Sig  . allopurinol (ZYLOPRIM) 300 MG tablet TAKE 1 TABLET BY MOUTH  DAILY  . Calcium Carb-Cholecalciferol (CALCIUM 1000 + D PO)  Take by mouth.  . hydrochlorothiazide (HYDRODIURIL) 25 MG tablet TAKE 1 TABLET BY MOUTH  DAILY  . levothyroxine (SYNTHROID, LEVOTHROID) 137 MCG tablet Take 1 tablet (137 mcg total) by mouth daily before breakfast.  . mometasone (NASONEX) 50 MCG/ACT nasal spray Place 2 sprays into the nose as needed.  . montelukast (SINGULAIR) 10 MG tablet Take 1 tablet (10 mg total) by mouth daily as needed. Only when flying  . polyethylene glycol powder (GLYCOLAX/MIRALAX) powder Mix 17 g in 8 oz of water daily (Patient taking differently: Take 0.5 Containers by mouth as needed. Mix 17 g in 8 oz of water daily)  . sildenafil (VIAGRA) 100 MG tablet Take 1 tablet (100 mg total) by mouth daily as needed for erectile dysfunction.  Marland Kitchen terazosin (HYTRIN) 5 MG capsule TAKE 1 CAPSULE DAILY   No facility-administered encounter medications on file as of 01/07/2016.     Activities of Daily Living In your present state of health, do you have any difficulty performing the following activities: 01/07/2016  Hearing? Y  Vision? N  Difficulty concentrating or making decisions? N  Walking or climbing stairs? Y  Dressing or bathing? N  Doing errands, shopping? N  Preparing Food and eating ? N  Using the Toilet? N  In the past six months, have you accidently leaked urine? N  Do you have  problems with loss of bowel control? N  Managing your Medications? N  Managing your Finances? N  Housekeeping or managing your Housekeeping? N  Some recent data might be hidden    Patient Care Team: Jinny Sanders, MD as PCP - General Birder Robson, MD as Referring Physician (Ophthalmology) Renato Shin, MD as Consulting Physician (Endocrinology) Nickie Retort, MD as Consulting Physician (Urology)   Assessment:     Hearing Screening   125Hz  250Hz  500Hz  1000Hz  2000Hz  3000Hz  4000Hz  6000Hz  8000Hz   Right ear:   0 0 40  40    Left ear:   40 0 0  0    Vision Screening Comments: Last vision exam with Dr. Merla Riches in December  2016   Exercise Activities and Dietary recommendations Current Exercise Habits: Home exercise routine, Type of exercise: walking;strength training/weights;Other - see comments (golf, cycle, yard work), Time (Minutes): 60, Frequency (Times/Week): 3, Weekly Exercise (Minutes/Week): 180, Intensity: Moderate, Exercise limited by: None identified  Goals    . Increase physical activity          Starting 01/07/2016, I will continue to exercise at 60 min 2-3 days per week.       Fall Risk Fall Risk  01/07/2016 01/10/2015 01/01/2014 12/28/2012  Falls in the past year? No No No No   Depression Screen PHQ 2/9 Scores 01/07/2016 01/10/2015 01/01/2014 12/28/2012  PHQ - 2 Score 0 0 0 0    Cognitive Function MMSE - Mini Mental State Exam 01/07/2016  Orientation to time 5  Orientation to Place 5  Registration 3  Attention/ Calculation 0  Recall 3  Language- name 2 objects 0  Language- repeat 1  Language- follow 3 step command 3  Language- read & follow direction 0  Write a sentence 0  Copy design 0  Total score 20       PLEASE NOTE: A Mini-Cog screen was completed. Maximum score is 20. A value of 0 denotes this part of Folstein MMSE was not completed or the patient failed this part of the Mini-Cog screening.   Mini-Cog Screening Orientation to Time - Max 5 pts Orientation to Place - Max 5 pts Registration - Max 3 pts Recall - Max 3 pts Language Repeat - Max 1 pts Language Follow 3 Step Command - Max 3 pts   Immunization History  Administered Date(s) Administered  . H1N1 04/18/2008  . Influenza Split 11/24/2010, 11/25/2011  . Influenza Whole 12/16/2006, 11/20/2007, 12/04/2008, 11/26/2009  . Influenza,inj,Quad PF,36+ Mos 10/31/2012, 11/20/2013, 11/29/2014, 11/25/2015  . Pneumococcal Conjugate-13 01/01/2014  . Pneumococcal Polysaccharide-23 07/30/2008  . Td 04/23/2006  . Zoster 11/16/2012   Screening Tests Health Maintenance  Topic Date Due  . TETANUS/TDAP  04/22/2016  .  Fecal DNA (Cologuard)  06/10/2018  . INFLUENZA VACCINE  Completed  . ZOSTAVAX  Completed  . PNA vac Low Risk Adult  Completed      Plan:     I have personally reviewed and addressed the Medicare Annual Wellness questionnaire and have noted the following in the patient's chart:  A. Medical and social history B. Use of alcohol, tobacco or illicit drugs  C. Current medications and supplements D. Functional ability and status E.  Nutritional status F.  Physical activity G. Advance directives H. List of other physicians I.  Hospitalizations, surgeries, and ER visits in previous 12 months J.  [place] to include hearing, vision, cognitive, depression L. Referrals and appointments - none  In addition, I have reviewed and discussed with  patient certain preventive protocols, quality metrics, and best practice recommendations. A written personalized care plan for preventive services as well as general preventive health recommendations were provided to patient.  See attached scanned questionnaire for additional information.   Signed,   Lindell Noe, MHA, BS, LPN Health Coach

## 2016-01-07 NOTE — Progress Notes (Signed)
Pre visit review using our clinic review tool, if applicable. No additional management support is needed unless otherwise documented below in the visit note. 

## 2016-01-08 NOTE — Progress Notes (Signed)
I reviewed health advisor's note, was available for consultation, and agree with documentation and plan.   Signed,  Keera Altidor T. Amara Justen, MD  

## 2016-01-13 ENCOUNTER — Ambulatory Visit (INDEPENDENT_AMBULATORY_CARE_PROVIDER_SITE_OTHER): Payer: Medicare Other | Admitting: Family Medicine

## 2016-01-13 ENCOUNTER — Encounter: Payer: Self-pay | Admitting: Family Medicine

## 2016-01-13 VITALS — BP 134/90 | HR 51 | Temp 98.3°F | Ht 72.5 in | Wt 264.8 lb

## 2016-01-13 DIAGNOSIS — J449 Chronic obstructive pulmonary disease, unspecified: Secondary | ICD-10-CM

## 2016-01-13 DIAGNOSIS — Z6833 Body mass index (BMI) 33.0-33.9, adult: Secondary | ICD-10-CM

## 2016-01-13 DIAGNOSIS — E7800 Pure hypercholesterolemia, unspecified: Secondary | ICD-10-CM

## 2016-01-13 DIAGNOSIS — I10 Essential (primary) hypertension: Secondary | ICD-10-CM | POA: Diagnosis not present

## 2016-01-13 DIAGNOSIS — R001 Bradycardia, unspecified: Secondary | ICD-10-CM

## 2016-01-13 DIAGNOSIS — L219 Seborrheic dermatitis, unspecified: Secondary | ICD-10-CM | POA: Diagnosis not present

## 2016-01-13 DIAGNOSIS — M1A00X0 Idiopathic chronic gout, unspecified site, without tophus (tophi): Secondary | ICD-10-CM

## 2016-01-13 DIAGNOSIS — E669 Obesity, unspecified: Secondary | ICD-10-CM | POA: Insufficient documentation

## 2016-01-13 MED ORDER — KETOCONAZOLE 2 % EX SHAM: MEDICATED_SHAMPOO | CUTANEOUS | 1 refills | Status: DC

## 2016-01-13 MED ORDER — KETOCONAZOLE 2 % EX CREA: TOPICAL_CREAM | CUTANEOUS | 1 refills | Status: DC

## 2016-01-13 NOTE — Assessment & Plan Note (Signed)
Stable control on allopurinol.

## 2016-01-13 NOTE — Progress Notes (Signed)
Pre visit review using our clinic review tool, if applicable. No additional management support is needed unless otherwise documented below in the visit note. 

## 2016-01-13 NOTE — Progress Notes (Signed)
Subjective:    Patient ID: Jason Solomon, male    DOB: [birth year], 75 y.o.   MRN: [id number]  HPI    75 year old male presents for CPX.  Earlier on 01/07/2016 he saw Candis Musa, LPN for medicare wellness. Note  reviewed in detail.  He has noted skin colored raised flaky skin lesion on left posterior head in last year. Not itchy, not bleeding, no change in size.  Hypertension:    Previously well controlled on  HCTZ , borderline today BP Readings from Last 3 Encounters:  01/13/16 134/90  01/07/16 110/78  10/07/15 (!) 148/85  Using medication without problems or lightheadedness:  none Chest pain with exertion: none Edema:  occ Short of breath: occ with exertion, stable Average home BPs: 125/80-90 Other issues:  Elevated Cholesterol  stable control on mediction Lab Results  Component Value Date   CHOL 160 01/07/2016   HDL 56.40 01/07/2016   LDLCALC 83 01/07/2016   TRIG 102.0 01/07/2016   CHOLHDL 3 01/07/2016  Using medications without problems: Muscle aches:  Diet compliance: moderate Exercise: gym 2-3 times a week, yard work, Marketing executive. Other complaints:   COPD, mild:  Stable DOE. On singulair for allergies.  Thyroiditis, no in hypothyroid stage: followed by Dr. Loanne Drilling.  Elevated PSA: followed by urologist.   Gout , well controlled on allopurinol, uric acid 5.0  flares: none in last year  Body mass index is 35.41 kg/m.    Bradycardia: pt asymptomatic. Stress echo nml.  Currently hypothyroid in med adjustment with Dr. Hale Bogus.Pt wishes to wait at this time for referral to cardiology.   Social History /Family History/Past Medical History reviewed and updated if needed.  Review of Systems  Constitutional: Negative for fatigue and fever.  HENT: Negative for ear pain.   Eyes: Negative for pain.  Respiratory: Positive for shortness of breath. Negative for cough.   Cardiovascular: Negative for chest pain, palpitations and leg swelling.    Gastrointestinal: Negative for abdominal pain.  Genitourinary: Negative for dysuria.  Musculoskeletal: Negative for arthralgias.  Skin: Positive for rash.  Neurological: Negative for syncope, light-headedness and headaches.  Psychiatric/Behavioral: Negative for dysphoric mood.       Objective:   Physical Exam  Constitutional: He appears well-developed and well-nourished.  Non-toxic appearance. He does not appear ill. No distress.  Overweight, central obesity  HENT:  Head: Normocephalic and atraumatic.  Right Ear: Hearing, tympanic membrane, external ear and ear canal normal.  Left Ear: Hearing, tympanic membrane, external ear and ear canal normal.  Nose: Nose normal.  Mouth/Throat: Uvula is midline, oropharynx is clear and moist and mucous membranes are normal.  Eyes: Conjunctivae, EOM and lids are normal. Pupils are equal, round, and reactive to light. Lids are everted and swept, no foreign bodies found.  Neck: Trachea normal, normal range of motion and phonation normal. Neck supple. Carotid bruit is not present. No thyroid mass and no thyromegaly present.  Cardiovascular: Regular rhythm, S1 normal, S2 normal, intact distal pulses and normal pulses.  Bradycardia present.  Exam reveals no gallop.   No murmur heard. Pulmonary/Chest: Breath sounds normal. He has no wheezes. He has no rhonchi. He has no rales.  Abdominal: Soft. Normal appearance and bowel sounds are normal. There is no hepatosplenomegaly. There is no tenderness. There is no rebound, no guarding and no CVA tenderness. No hernia. Hernia confirmed negative in the right inguinal area and confirmed negative in the left inguinal area.  Genitourinary: Prostate normal, testes normal and penis  normal. Rectal exam shows no external hemorrhoid, no internal hemorrhoid, no fissure, no mass, no tenderness, anal tone normal and guaiac negative stool. Prostate is not enlarged and not tender. Right testis shows no mass and no tenderness. Left  testis shows no mass and no tenderness. No paraphimosis or penile tenderness.  Lymphadenopathy:    He has no cervical adenopathy.       Right: No inguinal adenopathy present.       Left: No inguinal adenopathy present.  Neurological: He is alert. He has normal strength and normal reflexes. No cranial nerve deficit or sensory deficit. Gait normal.  Skin: Skin is warm, dry and intact. No rash noted.  Reddish dry skin on scalp in mustache and eye brows, dry flaky skin at corners of nose and behind ears  Psychiatric: He has a normal mood and affect. His speech is normal and behavior is normal. Judgment normal.          Assessment & Plan:  The patient's preventative maintenance and recommended screening tests for an annual wellness exam were reviewed in full today. Brought up to date unless services declined.  Counselled on the importance of diet, exercise, and its role in overall health and mortality. The patient's FH and SH was reviewed, including their home life, tobacco status, and drug and alcohol status.    Vaccines: uptodate Prostate Cancer Screen: per urology, Dr Sharin Grave Colon Cancer Screen:  Neg cologuard 06/10/2015, plan repeat 2020      Smoking Status: former smoker > 60 pack history no candidate for CT screen as quit > 15 years ago. Hearing screen failed on 01/07/2016, slight decrease in hearing

## 2016-01-13 NOTE — Assessment & Plan Note (Signed)
Asymptomatic, nml stress ECHO 2016. Currently titrating thyroid medication.

## 2016-01-13 NOTE — Assessment & Plan Note (Signed)
Moderate CVD risk given age, gender but LDL low on no medication.

## 2016-01-13 NOTE — Assessment & Plan Note (Signed)
Apply ketoconazole cream to skin area and shampoo to scalp.

## 2016-01-13 NOTE — Assessment & Plan Note (Signed)
Borderline on HCTZ, follow at home.

## 2016-01-13 NOTE — Patient Instructions (Addendum)
Work on low fat, low cholesterol low carb diet and keep up with exercise.  Start keotconazole cream and shampoo as directed.  Seborrheic Dermatitis, Adult Seborrheic dermatitis is a skin disease that causes red, scaly patches. It usually occurs on the scalp. The patches may appear on other parts of the body. Skin patches tend to appear where there are many oil glands in the skin. Areas of the body that are commonly affected include:  Scalp.  Skin folds of the body.  Ears.  Eyebrows.  Neck.  Face.  Armpits.  The bearded area of men's faces. The condition may come and go for no known reason, and it is often long-lasting (chronic). What are the causes? The cause of this condition is not known. What increases the risk? This condition is more likely to develop in people who:  Have certain conditions, such as:  HIV (human immunodeficiency virus).  AIDS (acquired immunodeficiency syndrome).  Parkinson disease.  Mood disorders, such as depression.  Are 42-65 years old. What are the signs or symptoms? Symptoms of this condition include:  Thick scales on the scalp.  Redness on the face or in the armpits.  Skin that is flaky. The flakes may be white or yellow.  Skin that seems oily or dry but is not helped with moisturizers.  Itching or burning in the affected areas. How is this diagnosed? This condition is diagnosed with a medical history and physical exam. A sample of your skin may be tested (skin biopsy). You may need to see a skin specialist (dermatologist). How is this treated? There is no cure for this condition, but treatment can help to manage the symptoms. You may get treatment to remove scales, lower the risk of skin infection, and reduce swelling or itching. Treatment may include:  Creams that reduce swelling and irritation (steroids).  Creams that reduce skin yeast.  Medicated shampoo, soaps, moisturizing creams, or ointments.  Medicated moisturizing  creams or ointments. Follow these instructions at home:  Apply over-the-counter and prescription medicines only as told by your health care provider.  Use any medicated shampoo, soaps, skin creams, or ointments only as told by your health care provider.  Keep all follow-up visits as told by your health care provider. This is important. Contact a health care provider if:  Your symptoms do not improve with treatment.  Your symptoms get worse.  You have new symptoms. This information is not intended to replace advice given to you by your health care provider. Make sure you discuss any questions you have with your health care provider. Document Released: 02/08/2005 Document Revised: 08/29/2015 Document Reviewed: 05/29/2015 Elsevier Interactive Patient Education  2017 [employer].

## 2016-01-13 NOTE — Assessment & Plan Note (Signed)
Minimally symtpomatic.

## 2016-01-13 NOTE — Assessment & Plan Note (Signed)
Encouraged exercise, weight loss, healthy eating habits. ? ?

## 2016-01-26 DIAGNOSIS — N400 Enlarged prostate without lower urinary tract symptoms: Secondary | ICD-10-CM | POA: Diagnosis not present

## 2016-01-26 DIAGNOSIS — R972 Elevated prostate specific antigen [PSA]: Secondary | ICD-10-CM | POA: Diagnosis not present

## 2016-01-26 DIAGNOSIS — R351 Nocturia: Secondary | ICD-10-CM | POA: Diagnosis not present

## 2016-01-26 DIAGNOSIS — N200 Calculus of kidney: Secondary | ICD-10-CM | POA: Diagnosis not present

## 2016-01-26 DIAGNOSIS — R3915 Urgency of urination: Secondary | ICD-10-CM | POA: Diagnosis not present

## 2016-02-19 ENCOUNTER — Telehealth: Payer: Self-pay | Admitting: Family Medicine

## 2016-02-19 DIAGNOSIS — R079 Chest pain, unspecified: Secondary | ICD-10-CM

## 2016-02-19 DIAGNOSIS — R0789 Other chest pain: Secondary | ICD-10-CM | POA: Diagnosis not present

## 2016-02-19 NOTE — Telephone Encounter (Signed)
[name]  DOB: Apr 08, 1940    Initial Comment Caller States he is having a twinge in his chest   Nurse Assessment  Nurse: Raphael Gibney, RN, Vanita Ingles Date/Time (Eastern Time): 02/19/2016 8:52:55 AM  Confirm and document reason for call. If symptomatic, describe symptoms. ---Caller states he has a "twinge" in his chest. Pain lasts a few seconds. Has had chest pain x 2 this am. No SOB. Chest pain is in the center of his chest.  Does the patient have any new or worsening symptoms? ---Yes  Will a triage be completed? ---Yes  Related visit to physician within the last 2 weeks? ---No  Does the PT have any chronic conditions? (i.e. diabetes, asthma, etc.) ---Yes  List chronic conditions. ---HTN  Is this a behavioral health or substance abuse call? ---No     Guidelines    Guideline Title Affirmed Question Affirmed Notes  Chest Pain [1] Chest pain lasting <= 5 minutes AND [2] NO chest pain or cardiac symptoms now (Exceptions: pains lasting a few seconds)    Final Disposition User   See Physician within 24 Hours [place], RN, Vera    Comments  no appts available within 24 hrs at [facility]. Pt does not want to go to urgent care or to another office. Please call pt back regarding appt   Referrals  GO TO FACILITY REFUSED   Disagree/Comply: Comply

## 2016-02-19 NOTE — Telephone Encounter (Signed)
I spoke with pt and offered appt at [facility] but pt is going to [facility] in [place]. FYI to Dr Diona Browner.

## 2016-02-20 ENCOUNTER — Other Ambulatory Visit (INDEPENDENT_AMBULATORY_CARE_PROVIDER_SITE_OTHER): Payer: Medicare Other

## 2016-02-20 DIAGNOSIS — R079 Chest pain, unspecified: Secondary | ICD-10-CM

## 2016-02-20 LAB — CBC WITH DIFFERENTIAL/PLATELET
BASOS PCT: 0.4 % (ref 0.0–3.0)
Basophils Absolute: 0 10*3/uL (ref 0.0–0.1)
EOS ABS: 0.1 10*3/uL (ref 0.0–0.7)
EOS PCT: 3.6 % (ref 0.0–5.0)
HEMATOCRIT: 42.6 % (ref 39.0–52.0)
HEMOGLOBIN: 14.2 g/dL (ref 13.0–17.0)
Lymphocytes Relative: 36.2 % (ref 12.0–46.0)
Lymphs Abs: 1.3 10*3/uL (ref 0.7–4.0)
MCHC: 33.3 g/dL (ref 30.0–36.0)
MCV: 91.7 fl (ref 78.0–100.0)
Monocytes Absolute: 0.3 10*3/uL (ref 0.1–1.0)
Monocytes Relative: 8.3 % (ref 3.0–12.0)
NEUTROS ABS: 1.9 10*3/uL (ref 1.4–7.7)
Neutrophils Relative %: 51.5 % (ref 43.0–77.0)
PLATELETS: 190 10*3/uL (ref 150.0–400.0)
RBC: 4.65 Mil/uL (ref 4.22–5.81)
RDW: 13.3 % (ref 11.5–15.5)
WBC: 3.7 10*3/uL — ABNORMAL LOW (ref 4.0–10.5)

## 2016-02-20 LAB — COMPREHENSIVE METABOLIC PANEL
ALT: 17 U/L (ref 0–53)
AST: 21 U/L (ref 0–37)
Albumin: 3.9 g/dL (ref 3.5–5.2)
Alkaline Phosphatase: 43 U/L (ref 39–117)
BUN: 16 mg/dL (ref 6–23)
CHLORIDE: 102 meq/L (ref 96–112)
CO2: 36 meq/L — AB (ref 19–32)
Calcium: 9.8 mg/dL (ref 8.4–10.5)
Creatinine, Ser: 1.03 mg/dL (ref 0.40–1.50)
GFR: 90.49 mL/min (ref >60.00)
Glucose, Bld: 121 mg/dL — ABNORMAL HIGH (ref 70–99)
POTASSIUM: 3.5 meq/L (ref 3.5–5.1)
SODIUM: 142 meq/L (ref 135–145)
Total Bilirubin: 0.8 mg/dL (ref 0.2–1.2)
Total Protein: 6.7 g/dL (ref 6.0–8.3)

## 2016-02-20 LAB — LIPASE: LIPASE: 27 U/L (ref 11.0–59.0)

## 2016-02-20 NOTE — Telephone Encounter (Signed)
Pt called back; pt had twinge in chest on 02/19/16 x 2. No twinge in chest so far today. No CP,SOB,H/A or dizziness. That is his only symptom. Pt said was advised at [facility] to get labs to see if something chemical that might have caused the twinge in chest. Urologist had pt to start on Myrbetriq 25 mg one daily approx 3 weeks ago; pt felt like needed to urinate but no urine would come. Pt wonders if Myrbetriq could cause the problem. Pt is still fasting in case needs labs and request cb ASAP.

## 2016-02-20 NOTE — Telephone Encounter (Signed)
Pt went to [facility] in [place], and was advised to f/u with pcp. EKG was done and was told there were possibly chemical changes that are triggering symptoms. He was advised to have labs performed to ck metabolic panel. I made him an appt 02/24/16 for follow up. Can he come in early for labs, has not eaten today.  EKG results- prolonged PR interval, 1st degree AV block.

## 2016-02-20 NOTE — Telephone Encounter (Signed)
Could possibly be  From myrbetriq as can cause SE of MSK pain. He does not need to fast. Have him come in for CMET today. Ordered.

## 2016-02-20 NOTE — Telephone Encounter (Signed)
He can come in for labs today if desired. Get summary of symptoms so I can determine what labs needed.

## 2016-02-20 NOTE — Telephone Encounter (Signed)
Pt advised, he will come in for labs today.

## 2016-02-24 ENCOUNTER — Ambulatory Visit (INDEPENDENT_AMBULATORY_CARE_PROVIDER_SITE_OTHER): Payer: Medicare Other | Admitting: Family Medicine

## 2016-02-24 ENCOUNTER — Encounter: Payer: Self-pay | Admitting: Family Medicine

## 2016-02-24 DIAGNOSIS — R001 Bradycardia, unspecified: Secondary | ICD-10-CM | POA: Diagnosis not present

## 2016-02-24 DIAGNOSIS — R0789 Other chest pain: Secondary | ICD-10-CM | POA: Diagnosis not present

## 2016-02-24 NOTE — Progress Notes (Signed)
Pre visit review using our clinic review tool, if applicable. No additional management support is needed unless otherwise documented below in the visit note. 

## 2016-02-24 NOTE — Patient Instructions (Addendum)
Call for referral to  Cardiology for bradycardia when you return for trip.  Go to ER with severe chest pain.

## 2016-02-24 NOTE — Progress Notes (Signed)
   Subjective:    Patient ID: Jason Solomon, male    DOB: 1940-06-10, 76 y.o.   MRN: [id number]  HPI   76 year old  male  With HTN, bradycardia presents for follow up after Urgent care visit on  12/30. Felt chest  Pain. Felt like spasm in chest ( felt like muscle spasm).. Occurred when woke up and again when drinking coffee.  Lasted seconds.  No additional symptoms, no SOB, no sweating, No Nausea. Occurred at rest, stitting , not occurring with exertion. EKG:  Showed   Has had 3 more episodes of pain, in last 3 days no further pain.  CMET nml, lipase, cbc nml   Recently start myrbetriq  Moderate CVD risk with  Age,, but LDL lowe, nonsmoker  Hx of asymptomatic bradycardia Nml stress ECHO 1/20-17  Review of Systems  Constitutional: Negative for fatigue and fever.  HENT: Negative for ear pain.   Eyes: Negative for pain.  Respiratory: Negative for cough and shortness of breath.   Cardiovascular: Positive for chest pain. Negative for palpitations and leg swelling.  Gastrointestinal: Negative for abdominal pain.  Genitourinary: Negative for dysuria.  Musculoskeletal: Negative for arthralgias.  Neurological: Negative for syncope, light-headedness and headaches.  Psychiatric/Behavioral: Negative for dysphoric mood.       Objective:   Physical Exam  Constitutional: Vital signs are normal. He appears well-developed and well-nourished.  HENT:  Head: Normocephalic.  Right Ear: Hearing normal.  Left Ear: Hearing normal.  Nose: Nose normal.  Mouth/Throat: Oropharynx is clear and moist and mucous membranes are normal.  Neck: Trachea normal. Carotid bruit is not present. No thyroid mass and no thyromegaly present.  Cardiovascular: Normal rate, regular rhythm and normal pulses.  Exam reveals no gallop, no distant heart sounds and no friction rub.   No murmur heard. No peripheral edema  Pulmonary/Chest: Effort normal and breath sounds normal. No respiratory distress.  Skin: Skin  is warm, dry and intact. No rash noted.  Psychiatric: He has a normal mood and affect. His speech is normal and behavior is normal. Thought content normal.          Assessment & Plan:

## 2016-02-27 ENCOUNTER — Telehealth: Payer: Self-pay

## 2016-02-27 MED ORDER — MONTELUKAST SODIUM 10 MG PO TABS: 10.0000 mg | ORAL_TABLET | Freq: Every day | ORAL | 11 refills | Status: DC | PRN

## 2016-02-27 NOTE — Telephone Encounter (Signed)
Pt called states he has an "emergency situation" he's about to go in a trip and is needing he's rx fpr flying resend to his pharmacy he just realized his rx has expired.  [facility] [address].

## 2016-02-27 NOTE — Telephone Encounter (Signed)
Singulair rx sent in.

## 2016-03-04 DIAGNOSIS — R0789 Other chest pain: Secondary | ICD-10-CM | POA: Insufficient documentation

## 2016-03-04 NOTE — Assessment & Plan Note (Signed)
Refer to cardiology for further eval given persistent. Pt fairly asymptomatic.

## 2016-03-04 NOTE — Assessment & Plan Note (Signed)
Most consistently with MSK strain but pt is moderate risk for CVD.  Refer to cardiology  For further eval.. nonurgent.

## 2016-03-29 ENCOUNTER — Other Ambulatory Visit: Payer: Self-pay | Admitting: Family Medicine

## 2016-04-26 DIAGNOSIS — N200 Calculus of kidney: Secondary | ICD-10-CM | POA: Diagnosis not present

## 2016-04-26 DIAGNOSIS — N400 Enlarged prostate without lower urinary tract symptoms: Secondary | ICD-10-CM | POA: Diagnosis not present

## 2016-04-26 DIAGNOSIS — R972 Elevated prostate specific antigen [PSA]: Secondary | ICD-10-CM | POA: Diagnosis not present

## 2016-06-07 ENCOUNTER — Ambulatory Visit (INDEPENDENT_AMBULATORY_CARE_PROVIDER_SITE_OTHER): Payer: Medicare Other | Admitting: Endocrinology

## 2016-06-07 VITALS — BP 116/70 | HR 52

## 2016-06-07 DIAGNOSIS — E042 Nontoxic multinodular goiter: Secondary | ICD-10-CM

## 2016-06-07 LAB — TSH: TSH: 2.8 u[IU]/mL (ref 0.35–4.50)

## 2016-06-07 NOTE — Progress Notes (Signed)
Subjective:    Patient ID: Jason Solomon, male    DOB: 12/09/1940, 76 y.o.   MRN: [id number]  HPI Pt returns for f/u of post-RAI hypothyroidism  (he had RAI for hyperthyroidism, due to small multinodular goiter, in late 2012; he has since had a fluctuating dosage requirement for synthroid; F/U US in 2014 showed significant shrinkage of the goiter).  He denies any swelling at the anterior neck.   Past Medical History:  Diagnosis Date  . 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 DIVERTICULOSIS, COLON 01/12/2007  . 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 GERD (gastroesophageal reflux disease)   . GOITER, MULTINODULAR 05/04/2010  . GOUT 01/12/2007  . Heart murmur    childhood only  . HYPERCHOLESTEROLEMIA 06/20/2007  . HYPERTENSION 01/12/2007  . HYPERTHYROIDISM 04/24/2010  . OBESITY 01/12/2007  . PROSTATE SPECIFIC ANTIGEN, ELEVATED 10/08/2009  . Renal calculi   . Shortness of breath dyspnea    with exertion    Past Surgical History:  Procedure Laterality Date  . CARDIOVASCULAR STRESS TEST  2006   negative  . HAND SURGERY  1949   left hand laceration  . HERNIA REPAIR  1990's   right and left inguinal hernia  . KNEE SURGERY  1990's  . TONSILLECTOMY  1945    Social History   Social History  . Marital status: Married    Spouse name: N/A  . Number of children: 4  . Years of education: N/A   Occupational History  .  Retired    Retired   Social History Main Topics  . Smoking status: Former Smoker    Quit date: 02/23/1988  . Smokeless tobacco: Never Used  . Alcohol use 0.0 oz/week     Comment: moderate-1 glass of wine daily, occ. scotch  . Drug use: No  . Sexual activity: No   Other Topics Concern  . Not on file   Social History Narrative   Regular exercise-yes, walks , golf   Occupation: Retired Hydrographic surveyor   Diet: (+) fruit, (+) veggies, (+) salad, (+) H20   Children: 4, healthy   Married x 43 years   Living will, [place],  daughter. Full code. (reviewed 2013)    Current Outpatient Prescriptions on File Prior to Visit  Medication Sig Dispense Refill  . allopurinol (ZYLOPRIM) 300 MG tablet TAKE 1 TABLET BY MOUTH  DAILY 90 tablet 1  . Calcium Carb-Cholecalciferol (CALCIUM 1000 + D PO) Take by mouth.    . hydrochlorothiazide (HYDRODIURIL) 25 MG tablet TAKE 1 TABLET BY MOUTH  DAILY 90 tablet 1  . levothyroxine (SYNTHROID, LEVOTHROID) 137 MCG tablet Take 1 tablet (137 mcg total) by mouth daily before breakfast. 90 tablet 3  . mometasone (NASONEX) 50 MCG/ACT nasal spray Place 2 sprays into the nose as needed. 17 g 0  . montelukast (SINGULAIR) 10 MG tablet Take 1 tablet (10 mg total) by mouth daily as needed. Only when flying 30 tablet 11  . MYRBETRIQ 25 MG TB24 tablet Take 25 mg by mouth daily.     . polyethylene glycol powder (GLYCOLAX/MIRALAX) powder Mix 17 g in 8 oz of water daily (Patient taking differently: Take 0.5 Containers by mouth as needed. Mix 17 g in 8 oz of water daily) 255 g 3  . sildenafil (VIAGRA) 100 MG tablet Take 1 tablet (100 mg total) by mouth daily as needed for erectile dysfunction. 10 tablet 0  . terazosin (HYTRIN) 5 MG capsule TAKE 1  CAPSULE DAILY 90 capsule 1   No current facility-administered medications on file prior to visit.     Allergies  Allergen Reactions  . Ace Inhibitors     REACTION: ?angioedema  . Angiotensin Receptor Blockers     REACTION: angioedema on ACE?    Family History  Problem Relation Age of Onset  . Hypertension Mother   . COPD Brother   . Lung cancer Brother 45  . Throat cancer Brother   . Thyroid disease Neg Hx     BP 116/70 (BP Location: Right Arm, Cuff Size: Large)   Pulse (!) 52   SpO2 94%    Review of Systems He has slight leg edema.     Objective:   Physical Exam VITAL SIGNS:  See vs page GENERAL: no distress NECK: There is no palpable thyroid enlargement.  No thyroid nodule is palpable.  No palpable lymphadenopathy at the anterior  neck. CV: 1+ bilat leg edema      Assessment & Plan:  Post-RAI hypothyroidism: due for recheck multinodular goiter, non-palpable.  Patient Instructions  A thyroid blood test is requested for you today.  We'll contact you with results.   I would be happy to see you back here as needed.

## 2016-06-07 NOTE — Patient Instructions (Addendum)
A thyroid blood test is requested for you today.  We'll contact you with results.   I would be happy to see you back here as needed.

## 2016-08-10 DIAGNOSIS — N200 Calculus of kidney: Secondary | ICD-10-CM | POA: Diagnosis not present

## 2016-08-15 ENCOUNTER — Other Ambulatory Visit: Payer: Self-pay | Admitting: Endocrinology

## 2016-09-20 ENCOUNTER — Encounter: Payer: Self-pay | Admitting: Family Medicine

## 2016-09-20 ENCOUNTER — Ambulatory Visit (INDEPENDENT_AMBULATORY_CARE_PROVIDER_SITE_OTHER): Payer: Medicare Other | Admitting: Family Medicine

## 2016-09-20 DIAGNOSIS — S39012A Strain of muscle, fascia and tendon of lower back, initial encounter: Secondary | ICD-10-CM | POA: Diagnosis not present

## 2016-09-20 MED ORDER — CYCLOBENZAPRINE HCL 10 MG PO TABS: 5.0000 mg | ORAL_TABLET | Freq: Two times a day (BID) | ORAL | 0 refills | Status: DC | PRN

## 2016-09-20 NOTE — Progress Notes (Signed)
Back pain.  Was working in the yard two days ago.  Cutting bushes, etc, a lot off stooping.  Cutting grass with riding mower.  After lunch he got up to go back outside and felt a stiff back.  Was able to get up and get moving but then he continued to have "twinges" in his back, with more trouble after a period of rest.  Stiff in the L lower back.  No FCNVAD.  No sharp pains.  No radicular pain.  No B/B sx.  Tried heat and ibuprofen in the meantime without a lot of relief, but some better.   Meds, vitals, and allergies reviewed.   ROS: Per HPI unless specifically indicated in ROS section   nad ncat Neck supple, no LA rrr ctab abd soft, not ttp L lower back ttp with spasm, no rash.  No midline back pain.  S/S wnl BLE

## 2016-09-20 NOTE — Patient Instructions (Signed)
Flexeril if needed, heat, stretch, and ibuprofen up to 3 times a day with food.  Take care.  Glad to see you.

## 2016-09-21 DIAGNOSIS — S39012A Strain of muscle, fascia and tendon of lower back, initial encounter: Secondary | ICD-10-CM | POA: Insufficient documentation

## 2016-09-21 NOTE — Assessment & Plan Note (Signed)
Likely benign strain. Benign exam. Discussed with patient. No need for imaging at this point.Flexeril if needed, heat, stretch, and ibuprofen up to 3 times a day with food.  He agrees. Update me as needed.

## 2016-10-12 ENCOUNTER — Encounter: Payer: Self-pay | Admitting: Family Medicine

## 2016-10-13 ENCOUNTER — Telehealth: Payer: Self-pay | Admitting: Family Medicine

## 2016-10-13 NOTE — Telephone Encounter (Signed)
DMV form completed and placed in Dr. Rometta Emery in box for signatures.

## 2016-10-13 NOTE — Telephone Encounter (Signed)
Pt dropped off [facility] medical paperwork that needs to be filled out.  Please call pt when ready for pickup. Placing in rx tower

## 2016-10-14 DIAGNOSIS — Z7689 Persons encountering health services in other specified circumstances: Secondary | ICD-10-CM

## 2016-10-14 NOTE — Telephone Encounter (Signed)
IN donna's box in my office

## 2016-10-14 NOTE — Telephone Encounter (Signed)
Mrs. Goodrich notified by telephone that Mr. [place] DMV paperwork is ready to be picked up at the front desk.

## 2016-11-14 ENCOUNTER — Other Ambulatory Visit: Payer: Self-pay | Admitting: Family Medicine

## 2016-11-30 ENCOUNTER — Ambulatory Visit (INDEPENDENT_AMBULATORY_CARE_PROVIDER_SITE_OTHER): Payer: Medicare Other

## 2016-11-30 DIAGNOSIS — Z23 Encounter for immunization: Secondary | ICD-10-CM | POA: Diagnosis not present

## 2017-01-24 ENCOUNTER — Telehealth: Payer: Self-pay | Admitting: Family Medicine

## 2017-01-24 DIAGNOSIS — M1A00X0 Idiopathic chronic gout, unspecified site, without tophus (tophi): Secondary | ICD-10-CM

## 2017-01-24 DIAGNOSIS — E7800 Pure hypercholesterolemia, unspecified: Secondary | ICD-10-CM

## 2017-01-24 NOTE — Telephone Encounter (Signed)
-----   Message from Eustace Pen, LPN sent at [phone]  3:08 PM EST ----- Regarding: Labs 12/3 Lab orders needed. Thank you.  Insurance:  [employer]

## 2017-01-25 ENCOUNTER — Ambulatory Visit: Payer: Medicare Other

## 2017-01-27 ENCOUNTER — Telehealth: Payer: Self-pay | Admitting: Family Medicine

## 2017-01-27 NOTE — Telephone Encounter (Signed)
Please enter AWV lab orders due to [place] out of office

## 2017-01-27 NOTE — Progress Notes (Signed)
Subjective:   Jason Solomon is a 76 y.o. male who presents for Medicare Annual/Subsequent preventive examination.  Review of Systems:  No ROS.  Medicare Wellness Visit. Additional risk factors are reflected in the social history.  Cardiac Risk Factors include: advanced age (>35men, >17 women);dyslipidemia;hypertension;male gender;obesity (BMI >30kg/m2)   Sleep patterns: Sleeps 5-6 hours.  Home Safety/Smoke Alarms: Feels safe in home. Smoke alarms in place.  Living environment; residence and Firearm Safety: Lives with wife in 2 story home.  Seat Belt Safety/Bike Helmet: Wears seat belt.    Male:   CCS-Colonoscopy 02/22/2005. Cologuard 06/10/2015, negative.      PSA-Followed by Urology.  Lab Results  Component Value Date   PSA 4.30 (H) 10/08/2009   PSA 4.15 (H) 09/09/2009   PSA 2.86 07/19/2008       Objective:    Vitals: BP (!) 142/80 (BP Location: Left Arm, Patient Position: Sitting, Cuff Size: Large)   Pulse (!) 55   Ht 6\' 1"  (1.854 m)   Wt 258 lb 1.9 oz (117.1 kg)   SpO2 97%   BMI 34.05 kg/m   Body mass index is 34.05 kg/m.  Advanced Directives 01/28/2017 01/07/2016 01/02/2015 10/03/2014 09/23/2014  Does Patient Have a Medical Advance Directive? Yes Yes No No No  Type of Paramedic of [place];Living will [place];Living will - - -  Does patient want to make changes to medical advance directive? - No - Patient declined - - -  Copy of [place] in Chart? No - copy requested No - copy requested - - -  Would patient like information on creating a medical advance directive? - - No - patient declined information Yes - Educational materials given -    Tobacco Social History   Tobacco Use  Smoking Status Former Smoker  . Last attempt to quit: 02/23/1988  . Years since quitting: 28.9  Smokeless Tobacco Never Used     Counseling given: Not Answered     Past Medical History:  Diagnosis Date  .  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 DIVERTICULOSIS, COLON 01/12/2007  . 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 GERD (gastroesophageal reflux disease)   . GOITER, MULTINODULAR 05/04/2010  . GOUT 01/12/2007  . Heart murmur    childhood only  . HYPERCHOLESTEROLEMIA 06/20/2007  . HYPERTENSION 01/12/2007  . HYPERTHYROIDISM 04/24/2010  . OBESITY 01/12/2007  . PROSTATE SPECIFIC ANTIGEN, ELEVATED 10/08/2009  . Renal calculi   . Shortness of breath dyspnea    with exertion   Past Surgical History:  Procedure Laterality Date  . CARDIOVASCULAR STRESS TEST  2006   negative  . HAND SURGERY  1949   left hand laceration  . HERNIA REPAIR  1990's   right and left inguinal hernia  . KNEE SURGERY  1990's  . TONSILLECTOMY  1945   Family History  Problem Relation Age of Onset  . Hypertension Mother   . COPD Brother   . Lung cancer Brother 45  . Throat cancer Brother   . Thyroid disease Neg Hx    Social History   Socioeconomic History  . Marital status: Married    Spouse name: None  . Number of children: 4  . Years of education: None  . Highest education level: None  Social Needs  . Financial resource strain: None  . Food insecurity - worry: None  . Food insecurity - inability: None  . Transportation needs - medical: None  . Transportation  needs - non-medical: None  Occupational History    Employer: RETIRED    Comment: Retired  Tobacco Use  . Smoking status: Former Smoker    Last attempt to quit: 02/23/1988    Years since quitting: 28.9  . Smokeless tobacco: Never Used  Substance and Sexual Activity  . Alcohol use: Yes    Alcohol/week: 0.0 oz    Comment: moderate-1 glass of wine daily, occ. scotch  . Drug use: No  . Sexual activity: No  Other Topics Concern  . None  Social History Narrative   Regular exercise-yes, walks , golf   Occupation: Retired Hydrographic surveyor   Diet: (+) fruit, (+) veggies, (+) salad, (+) H20   Children: 4, healthy   Married x 43  years   Living will, [place], daughter. Full code. (reviewed 2013)    Outpatient Encounter Medications as of 01/28/2017  Medication Sig  . allopurinol (ZYLOPRIM) 300 MG tablet TAKE 1 TABLET BY MOUTH  DAILY  . Calcium Carb-Cholecalciferol (CALCIUM 1000 + D PO) Take by mouth.  . cyclobenzaprine (FLEXERIL) 10 MG tablet Take 0.5-1 tablets (5-10 mg total) by mouth 2 (two) times daily as needed for muscle spasms.  . hydrochlorothiazide (HYDRODIURIL) 25 MG tablet TAKE 1 TABLET BY MOUTH  DAILY  . levothyroxine (SYNTHROID, LEVOTHROID) 137 MCG tablet Take 1 tablet (137 mcg total) by mouth daily before breakfast.  . mometasone (NASONEX) 50 MCG/ACT nasal spray Place 2 sprays into the nose as needed.  . montelukast (SINGULAIR) 10 MG tablet Take 1 tablet (10 mg total) by mouth daily as needed. Only when flying  . MYRBETRIQ 25 MG TB24 tablet Take 25 mg by mouth daily.   . polyethylene glycol powder (GLYCOLAX/MIRALAX) powder Mix 17 g in 8 oz of water daily (Patient taking differently: Take 0.5 Containers by mouth as needed. Mix 17 g in 8 oz of water daily)  . sildenafil (VIAGRA) 100 MG tablet Take 1 tablet (100 mg total) by mouth daily as needed for erectile dysfunction.  Marland Kitchen terazosin (HYTRIN) 5 MG capsule TAKE 1 CAPSULE BY MOUTH  DAILY   No facility-administered encounter medications on file as of 01/28/2017.     Activities of Daily Living In your present state of health, do you have any difficulty performing the following activities: 01/28/2017  Hearing? N  Vision? N  Difficulty concentrating or making decisions? N  Walking or climbing stairs? N  Dressing or bathing? N  Doing errands, shopping? N  Preparing Food and eating ? N  Using the Toilet? N  In the past six months, have you accidently leaked urine? N  Do you have problems with loss of bowel control? N  Managing your Medications? N  Managing your Finances? N  Housekeeping or managing your Housekeeping? N  Some recent data  might be hidden     Patient Care Team: Jinny Sanders, MD as PCP - General Birder Robson, MD as Referring Physician (Ophthalmology) Renato Shin, MD as Consulting Physician (Endocrinology) Nickie Retort, MD as Consulting Physician (Urology)   Assessment:    Physical assessment deferred to PCP.  Exercise Activities and Dietary recommendations Current Exercise Habits: Structured exercise class, Type of exercise: treadmill;strength training/weights(golf, bowl. yard work), Time (Minutes): 60, Frequency (Times/Week): 3, Weekly Exercise (Minutes/Week): 180, Exercise limited by: None identified   Diet (meal preparation, eat out, water intake, caffeinated beverages, dairy products, fruits and vegetables): Drinks water, coffee and wine.   Attempts to eat heart healthy diet.  Goals    . Weight (lb) < 250 lb (113.4 kg)     Lose weight by staying active and watch diet.       Fall Risk Fall Risk  01/28/2017 01/07/2016 01/10/2015 01/01/2014 12/28/2012  Falls in the past year? No No No No No   Depression Screen PHQ 2/9 Scores 01/28/2017 01/07/2016 01/10/2015 01/01/2014  PHQ - 2 Score 0 0 0 0    Cognitive Function MMSE - Mini Mental State Exam 01/28/2017 01/07/2016  Orientation to time 5 5  Orientation to Place 5 5  Registration 3 3  Attention/ Calculation 5 0  Recall 2 3  Language- name 2 objects 2 0  Language- repeat 1 1  Language- follow 3 step command 3 3  Language- read & follow direction 1 0  Write a sentence 1 0  Copy design 1 0  Total score 29 20        Immunization History  Administered Date(s) Administered  . H1N1 04/18/2008  . Influenza Split 11/24/2010, 11/25/2011  . Influenza Whole 12/16/2006, 11/20/2007, 12/04/2008, 11/26/2009  . Influenza,inj,Quad PF,6+ Mos 10/31/2012, 11/20/2013, 11/29/2014, 11/25/2015, 11/30/2016, 11/30/2016  . Pneumococcal Conjugate-13 01/01/2014  . Pneumococcal Polysaccharide-23 07/30/2008  . Td 04/23/2006  . Zoster 11/16/2012     Screening Tests Health Maintenance  Topic Date Due  . TETANUS/TDAP  01/28/2018 (Originally 04/22/2016)  . INFLUENZA VACCINE  Completed  . PNA vac Low Risk Adult  Completed      Plan:    Bring a copy of your living will and/or healthcare power of attorney to your next office visit.  Continue doing brain stimulating activities (puzzles, reading, adult coloring books, staying active) to keep memory sharp.    I have personally reviewed and noted the following in the patient's chart:   . Medical and social history . Use of alcohol, tobacco or illicit drugs  . Current medications and supplements . Functional ability and status . Nutritional status . Physical activity . Advanced directives . List of other physicians . Hospitalizations, surgeries, and ER visits in previous 12 months . Vitals . Screenings to include cognitive, depression, and falls . Referrals and appointments  In addition, I have reviewed and discussed with patient certain preventive protocols, quality metrics, and best practice recommendations. A written personalized care plan for preventive services as well as general preventive health recommendations were provided to patient.     Gerilyn Nestle, RN  01/28/2017

## 2017-01-28 ENCOUNTER — Other Ambulatory Visit: Payer: Self-pay

## 2017-01-28 ENCOUNTER — Other Ambulatory Visit: Payer: Medicare Other

## 2017-01-28 ENCOUNTER — Ambulatory Visit (INDEPENDENT_AMBULATORY_CARE_PROVIDER_SITE_OTHER): Payer: Medicare Other

## 2017-01-28 VITALS — BP 142/80 | HR 55 | Ht 73.0 in | Wt 258.1 lb

## 2017-01-28 DIAGNOSIS — E7800 Pure hypercholesterolemia, unspecified: Secondary | ICD-10-CM | POA: Diagnosis not present

## 2017-01-28 DIAGNOSIS — Z0000 Encounter for general adult medical examination without abnormal findings: Secondary | ICD-10-CM

## 2017-01-28 DIAGNOSIS — M1A00X0 Idiopathic chronic gout, unspecified site, without tophus (tophi): Secondary | ICD-10-CM

## 2017-01-28 LAB — COMPREHENSIVE METABOLIC PANEL
ALK PHOS: 43 U/L (ref 39–117)
ALT: 14 U/L (ref 0–53)
AST: 23 U/L (ref 0–37)
Albumin: 3.9 g/dL (ref 3.5–5.2)
BILIRUBIN TOTAL: 0.8 mg/dL (ref 0.2–1.2)
BUN: 16 mg/dL (ref 6–23)
CO2: 34 meq/L — AB (ref 19–32)
CREATININE: 1.02 mg/dL (ref 0.40–1.50)
Calcium: 9.7 mg/dL (ref 8.4–10.5)
Chloride: 102 mEq/L (ref 96–112)
GFR: 91.28 mL/min (ref >60.00)
GLUCOSE: 87 mg/dL (ref 70–99)
Potassium: 4.4 mEq/L (ref 3.5–5.1)
SODIUM: 140 meq/L (ref 135–145)
TOTAL PROTEIN: 6.3 g/dL (ref 6.0–8.3)

## 2017-01-28 LAB — LIPID PANEL
CHOL/HDL RATIO: 3
Cholesterol: 159 mg/dL (ref 0–200)
HDL: 53.2 mg/dL (ref >39.00)
LDL Cholesterol: 81 mg/dL (ref 0–99)
NONHDL: 105.39
Triglycerides: 122 mg/dL (ref 0.0–149.0)
VLDL: 24.4 mg/dL (ref 0.0–40.0)

## 2017-01-28 LAB — URIC ACID: URIC ACID, SERUM: 5.3 mg/dL (ref 4.0–7.8)

## 2017-01-28 NOTE — Progress Notes (Signed)
PCP notes:   Health maintenance:  Request Shingrix Rx at visit with PCP  Abnormal screenings:    Failed hearing/audiology screening  Patient concerns:   None  Nurse concerns:  BP 142/80  Next PCP appt:   02/03/2017

## 2017-01-28 NOTE — Patient Instructions (Addendum)
Bring a copy of your living will and/or healthcare power of attorney to your next office visit.  Continue doing brain stimulating activities (puzzles, reading, adult coloring books, staying active) to keep memory sharp.    Jason Solomon , Thank you for taking time to come for your Medicare Wellness Visit. I appreciate your ongoing commitment to your health goals. Please review the following plan we discussed and let me know if I can assist you in the future.   These are the goals we discussed: Goals    . Weight (lb) < 250 lb (113.4 kg)     Lose weight by staying active and watch diet.        This is a list of the screening recommended for you and due dates:  Health Maintenance  Topic Date Due  . Tetanus Vaccine  01/28/2018*  . Flu Shot  Completed  . Pneumonia vaccines  Completed  *Topic was postponed. The date shown is not the original due date.     Health Maintenance, Male A healthy lifestyle and preventive care is important for your health and wellness. Ask your health care provider about what schedule of regular examinations is right for you. What should I know about weight and diet? Eat a Healthy Diet  Eat plenty of vegetables, fruits, whole grains, low-fat dairy products, and lean protein.  Do not eat a lot of foods high in solid fats, added sugars, or salt.  Maintain a Healthy Weight Regular exercise can help you achieve or maintain a healthy weight. You should:  Do at least 150 minutes of exercise each week. The exercise should increase your heart rate and make you sweat (moderate-intensity exercise).  Do strength-training exercises at least twice a week.  Watch Your Levels of Cholesterol and Blood Lipids  Have your blood tested for lipids and cholesterol every 5 years starting at 76 years of age. If you are at high risk for heart disease, you should start having your blood tested when you are 76 years old. You may need to have your cholesterol levels checked more often  if: ? Your lipid or cholesterol levels are high. ? You are older than 76 years of age. ? You are at high risk for heart disease.  What should I know about cancer screening? Many types of cancers can be detected early and may often be prevented. Lung Cancer  You should be screened every year for lung cancer if: ? You are a current smoker who has smoked for at least 30 years. ? You are a former smoker who has quit within the past 15 years.  Talk to your health care provider about your screening options, when you should start screening, and how often you should be screened.  Colorectal Cancer  Routine colorectal cancer screening usually begins at 76 years of age and should be repeated every 5-10 years until you are 76 years old. You may need to be screened more often if early forms of precancerous polyps or small growths are found. Your health care provider may recommend screening at an earlier age if you have risk factors for colon cancer.  Your health care provider may recommend using home test kits to check for hidden blood in the stool.  A small camera at the end of a tube can be used to examine your colon (sigmoidoscopy or colonoscopy). This checks for the earliest forms of colorectal cancer.  Prostate and Testicular Cancer  Depending on your age and overall health, your health care  provider may do certain tests to screen for prostate and testicular cancer.  Talk to your health care provider about any symptoms or concerns you have about testicular or prostate cancer.  Skin Cancer  Check your skin from head to toe regularly.  Tell your health care provider about any new moles or changes in moles, especially if: ? There is a change in a mole's size, shape, or color. ? You have a mole that is larger than a pencil eraser.  Always use sunscreen. Apply sunscreen liberally and repeat throughout the day.  Protect yourself by wearing long sleeves, pants, a wide-brimmed hat, and  sunglasses when outside.  What should I know about heart disease, diabetes, and high blood pressure?  If you are 58-30 years of age, have your blood pressure checked every 3-5 years. If you are 4 years of age or older, have your blood pressure checked every year. You should have your blood pressure measured twice-once when you are at a hospital or clinic, and once when you are not at a hospital or clinic. Record the average of the two measurements. To check your blood pressure when you are not at a hospital or clinic, you can use: ? An automated blood pressure machine at a pharmacy. ? A home blood pressure monitor.  Talk to your health care provider about your target blood pressure.  If you are between 93-76 years old, ask your health care provider if you should take aspirin to prevent heart disease.  Have regular diabetes screenings by checking your fasting blood sugar level. ? If you are at a normal weight and have a low risk for diabetes, have this test once every three years after the age of 26. ? If you are overweight and have a high risk for diabetes, consider being tested at a younger age or more often.  A one-time screening for abdominal aortic aneurysm (AAA) by ultrasound is recommended for men aged 74-75 years who are current or former smokers. What should I know about preventing infection? Hepatitis B If you have a higher risk for hepatitis B, you should be screened for this virus. Talk with your health care provider to find out if you are at risk for hepatitis B infection. Hepatitis C Blood testing is recommended for:  Everyone born from [birth year] through 1965.  Anyone with known risk factors for hepatitis C.  Sexually Transmitted Diseases (STDs)  You should be screened each year for STDs including gonorrhea and chlamydia if: ? You are sexually active and are younger than 76 years of age. ? You are older than 76 years of age and your health care provider tells you that you are  at risk for this type of infection. ? Your sexual activity has changed since you were last screened and you are at an increased risk for chlamydia or gonorrhea. Ask your health care provider if you are at risk.  Talk with your health care provider about whether you are at high risk of being infected with HIV. Your health care provider may recommend a prescription medicine to help prevent HIV infection.  What else can I do?  Schedule regular health, dental, and eye exams.  Stay current with your vaccines (immunizations).  Do not use any tobacco products, such as cigarettes, chewing tobacco, and e-cigarettes. If you need help quitting, ask your health care provider.  Limit alcohol intake to no more than 2 drinks per day. One drink equals 12 ounces of beer, 5 ounces of wine, or  1 ounces of hard liquor.  Do not use street drugs.  Do not share needles.  Ask your health care provider for help if you need support or information about quitting drugs.  Tell your health care provider if you often feel depressed.  Tell your health care provider if you have ever been abused or do not feel safe at home. This information is not intended to replace advice given to you by your health care provider. Make sure you discuss any questions you have with your health care provider. Document Released: 08/07/2007 Document Revised: 10/08/2015 Document Reviewed: 11/12/2014 Elsevier Interactive Patient Education  [employer].

## 2017-01-28 NOTE — Progress Notes (Signed)
I reviewed health advisor's note, was available for consultation, and agree with documentation and plan.  

## 2017-01-28 NOTE — Addendum Note (Signed)
Addended by: Mady Haagensen on: 01/28/2017 03:01 PM   Modules accepted: Orders

## 2017-01-31 ENCOUNTER — Ambulatory Visit: Payer: Medicare Other

## 2017-02-03 ENCOUNTER — Other Ambulatory Visit: Payer: Self-pay | Admitting: Family Medicine

## 2017-02-03 ENCOUNTER — Other Ambulatory Visit: Payer: Self-pay

## 2017-02-03 ENCOUNTER — Encounter: Payer: Self-pay | Admitting: Family Medicine

## 2017-02-03 ENCOUNTER — Ambulatory Visit (INDEPENDENT_AMBULATORY_CARE_PROVIDER_SITE_OTHER): Payer: Medicare Other | Admitting: Family Medicine

## 2017-02-03 VITALS — BP 112/74 | HR 60 | Temp 98.3°F | Ht 73.0 in | Wt 266.0 lb

## 2017-02-03 DIAGNOSIS — J449 Chronic obstructive pulmonary disease, unspecified: Secondary | ICD-10-CM | POA: Diagnosis not present

## 2017-02-03 DIAGNOSIS — M1A00X0 Idiopathic chronic gout, unspecified site, without tophus (tophi): Secondary | ICD-10-CM | POA: Diagnosis not present

## 2017-02-03 DIAGNOSIS — E669 Obesity, unspecified: Secondary | ICD-10-CM | POA: Diagnosis not present

## 2017-02-03 DIAGNOSIS — E7800 Pure hypercholesterolemia, unspecified: Secondary | ICD-10-CM

## 2017-02-03 DIAGNOSIS — Z0000 Encounter for general adult medical examination without abnormal findings: Secondary | ICD-10-CM

## 2017-02-03 DIAGNOSIS — I10 Essential (primary) hypertension: Secondary | ICD-10-CM | POA: Diagnosis not present

## 2017-02-03 NOTE — Assessment & Plan Note (Signed)
Stable control on allopurinol.

## 2017-02-03 NOTE — Assessment & Plan Note (Signed)
Well controlled. Continue current medication.  

## 2017-02-03 NOTE — Assessment & Plan Note (Signed)
Minimal SOB.Marland Kitchen Has some AM congestion that sounds more like allergies.. Try zyrtec at bedtime.

## 2017-02-03 NOTE — Assessment & Plan Note (Signed)
Moderate CVD risk given age.. But LDL at goal and HDL high. On no medication.

## 2017-02-03 NOTE — Patient Instructions (Addendum)
Can try zyrtec/allegra at bedtime for possible allergies causing clearing throat in AMs.  Work on [employer], weight loss and keep up regular exercise.

## 2017-02-03 NOTE — Assessment & Plan Note (Addendum)
Encouraged exercise, weight loss, healthy eating habits. ? ?

## 2017-02-03 NOTE — Progress Notes (Signed)
Subjective:    Patient ID: Jason Solomon, male    DOB: [birth year], 76 y.o.   MRN: [id number]  HPI  The patient presents for  complete physical and review of chronic health problems. He/She also has the following acute concerns today:  The patient saw Candis Musa, LPN for medicare wellness. Note reviewed in detail and important notes copied below.  Health maintenance:             Request Shingrix Rx at visit with PCP  Abnormal screenings:              Failed hearing/audiology screening  Patient concerns:              None  Nurse concerns:             BP 142/80   02/03/17 Today:  Hypertension:   good control on HCTZ  BP Readings from Last 3 Encounters:  02/03/17 112/74  01/28/17 (!) 142/80  09/20/16 102/70  Using medication without problems or lightheadedness:  none Chest pain with exertion: none Edema: none Short of breath: none Average home BPs: Other issues:   Thyroid issues followed by Dr. Loanne Drilling ENDO. Last TSH 2.80 on levothyroxine.  Following up yearly.  Gout  Good control on allopurinol.  No flares in last year Lab Results  Component Value Date   LABURIC 5.3 01/28/2017    Elevated Cholesterol:  LDL at goal < 100 on no med.  Lab Results  Component Value Date   CHOL 159 01/28/2017   HDL 53.20 01/28/2017   LDLCALC 81 01/28/2017   TRIG 122.0 01/28/2017   CHOLHDL 3 01/28/2017   Diet compliance: good Exercise: going to gym 2 days a week Other complaints:  Body mass index is 35.09 kg/m.   Wt Readings from Last 3 Encounters:  02/03/17 266 lb (120.7 kg)  01/28/17 258 lb 1.9 oz (117.1 kg)  09/20/16 267 lb 8 oz (121.3 kg)     COPD, mild: stable,  Throat clearing daily may be due to allergies, but no cough.   Social History /Family History/Past Medical History reviewed in detail and updated in EMR if needed. Blood pressure 112/74, pulse 60, temperature 98.3 F (36.8 C), temperature source Oral, height 6\' 1"  (1.854 m), weight 266 lb  (120.7 kg).    Review of Systems  Constitutional: Negative for fatigue and fever.  HENT: Negative for ear pain.   Eyes: Negative for pain.  Respiratory: Negative for cough and shortness of breath.   Cardiovascular: Negative for chest pain, palpitations and leg swelling.  Gastrointestinal: Negative for abdominal pain.  Genitourinary: Negative for dysuria.  Musculoskeletal: Negative for arthralgias.       Occ left wrist pain, self limited  Neurological: Negative for syncope, light-headedness and headaches.  Psychiatric/Behavioral: Negative for dysphoric mood.       Objective:   Physical Exam  Constitutional: He appears well-developed and well-nourished.  Non-toxic appearance. He does not appear ill. No distress.  Obesity Body mass index is 35.09 kg/m.   HENT:  Head: Normocephalic and atraumatic.  Right Ear: Hearing, tympanic membrane, external ear and ear canal normal.  Left Ear: Hearing, tympanic membrane, external ear and ear canal normal.  Nose: Nose normal.  Mouth/Throat: Uvula is midline, oropharynx is clear and moist and mucous membranes are normal.  Eyes: Conjunctivae, EOM and lids are normal. Pupils are equal, round, and reactive to light. Lids are everted and swept, no foreign bodies found.  Neck: Trachea normal, normal range of  motion and phonation normal. Neck supple. Carotid bruit is not present. No thyroid mass and no thyromegaly present.  Cardiovascular: Normal rate, regular rhythm, S1 normal, S2 normal, intact distal pulses and normal pulses. Exam reveals no gallop.  No murmur heard. Pulmonary/Chest: Breath sounds normal. He has no wheezes. He has no rhonchi. He has no rales.  Abdominal: Soft. Normal appearance and bowel sounds are normal. There is no hepatosplenomegaly. There is no tenderness. There is no rebound, no guarding and no CVA tenderness. No hernia.  Lymphadenopathy:    He has no cervical adenopathy.  Neurological: He is alert. He has normal strength and  normal reflexes. No cranial nerve deficit or sensory deficit. Gait normal.  Skin: Skin is warm, dry and intact. No rash noted.  Psychiatric: He has a normal mood and affect. His speech is normal and behavior is normal. Judgment normal.          Assessment & Plan:  The patient's preventative maintenance and recommended screening tests for an annual wellness exam were reviewed in full today. Brought up to date unless services declined.  Counselled on the importance of diet, exercise, and its role in overall health and mortality. The patient's FH and SH was reviewed, including their home life, tobacco status, and drug and alcohol status.   Vaccines: uptodate now Prostate Cancer Screen: per urology, Dr Sharin Grave Colon Cancer Screen:  Neg cologuard 06/10/2015, plan repeat 2020      Smoking Status: former smoker > 60 pack history not candidate for CT screen as quit > 15 years ago. Hearing screen failed on 2017, 2018, slight decrease in hearing

## 2017-02-23 ENCOUNTER — Encounter: Payer: Self-pay | Admitting: Family Medicine

## 2017-03-04 ENCOUNTER — Encounter: Payer: Self-pay | Admitting: Family Medicine

## 2017-03-11 ENCOUNTER — Other Ambulatory Visit: Payer: Self-pay

## 2017-03-11 ENCOUNTER — Encounter: Payer: Self-pay | Admitting: Family Medicine

## 2017-03-11 ENCOUNTER — Ambulatory Visit (INDEPENDENT_AMBULATORY_CARE_PROVIDER_SITE_OTHER): Payer: Medicare Other | Admitting: Family Medicine

## 2017-03-11 ENCOUNTER — Telehealth: Payer: Self-pay | Admitting: Family Medicine

## 2017-03-11 DIAGNOSIS — J449 Chronic obstructive pulmonary disease, unspecified: Secondary | ICD-10-CM

## 2017-03-11 DIAGNOSIS — I10 Essential (primary) hypertension: Secondary | ICD-10-CM

## 2017-03-11 NOTE — Assessment & Plan Note (Signed)
Well controlled per symptoms on no medication.  repeat spirometry today.

## 2017-03-11 NOTE — Assessment & Plan Note (Signed)
Well controlled. Continue current medication.  

## 2017-03-11 NOTE — Progress Notes (Signed)
   Subjective:    Patient ID: Jason Solomon, male    DOB: [birth year], 77 y.o.   MRN: [id number]  HPI   77 year old male presents for DMV eval to assess HTN control and COPD control.  Hypertension:  Good control on HCTZ. Also on terazosin.   BP Readings from Last 3 Encounters:  03/11/17 124/80  02/03/17 112/74  01/28/17 (!) 142/80  Using medication without problems or lightheadedness:  none Chest pain with exertion: none Edema: daily Short of breath: none Average home BPs: Good control at home. Other issues:  COPD Feels symptoms are well controlled: Yes, only has shortness of breath with going up stairs.  Some excessive mucus at night and in morning. Using medications without problems: Night time symptoms: no wheezing at night. ER visits since last visit: none Missed work or school: none Sputum production at baseline: moderate Increased cough: to clear mucus, then no further Increased SOB: minimal Using O2: NO   Not requiring albuterol  Or controller medication.   Last spirometry year ago 12/2012.      Review of Systems  Constitutional: Negative for fatigue and fever.  HENT: Negative for ear pain.   Eyes: Negative for pain.  Respiratory: Negative for cough and shortness of breath.   Cardiovascular: Negative for chest pain, palpitations and leg swelling.  Gastrointestinal: Negative for abdominal pain.  Genitourinary: Negative for dysuria.  Musculoskeletal: Negative for arthralgias.  Neurological: Negative for syncope, light-headedness and headaches.  Psychiatric/Behavioral: Negative for dysphoric mood.       Objective:   Physical Exam  Constitutional: Vital signs are normal. He appears well-developed and well-nourished.  HENT:  Head: Normocephalic.  Right Ear: Hearing normal.  Left Ear: Hearing normal.  Nose: Nose normal.  Mouth/Throat: Oropharynx is clear and moist and mucous membranes are normal.  Neck: Trachea normal. Carotid bruit is not present.  No thyroid mass and no thyromegaly present.  Cardiovascular: Normal rate, regular rhythm and normal pulses. Exam reveals no gallop, no distant heart sounds and no friction rub.  No murmur heard. No peripheral edema  Pulmonary/Chest: Effort normal and breath sounds normal. No respiratory distress.  Skin: Skin is warm, dry and intact. No rash noted.  Psychiatric: He has a normal mood and affect. His speech is normal and behavior is normal. Thought content normal.          Assessment & Plan:

## 2017-03-16 NOTE — Telephone Encounter (Signed)
letter

## 2017-04-18 ENCOUNTER — Other Ambulatory Visit: Payer: Self-pay | Admitting: Family Medicine

## 2017-05-07 ENCOUNTER — Other Ambulatory Visit: Payer: Self-pay | Admitting: Endocrinology

## 2017-05-09 DIAGNOSIS — N400 Enlarged prostate without lower urinary tract symptoms: Secondary | ICD-10-CM | POA: Diagnosis not present

## 2017-05-09 DIAGNOSIS — N200 Calculus of kidney: Secondary | ICD-10-CM | POA: Diagnosis not present

## 2017-06-30 ENCOUNTER — Telehealth: Payer: Self-pay | Admitting: Endocrinology

## 2017-06-30 ENCOUNTER — Other Ambulatory Visit: Payer: Self-pay

## 2017-06-30 MED ORDER — LEVOTHYROXINE SODIUM 137 MCG PO TABS: ORAL_TABLET | ORAL | 3 refills | Status: DC

## 2017-06-30 NOTE — Telephone Encounter (Signed)
Patient needs new RX for Levothyroxine .137 sent to [facility] with refills (he has no more refills left)

## 2017-06-30 NOTE — Telephone Encounter (Signed)
I have sent to patient;'s pharmacy.  

## 2017-08-16 ENCOUNTER — Encounter: Payer: Self-pay | Admitting: Family Medicine

## 2017-08-16 ENCOUNTER — Ambulatory Visit (INDEPENDENT_AMBULATORY_CARE_PROVIDER_SITE_OTHER): Payer: Medicare Other | Admitting: Family Medicine

## 2017-08-16 VITALS — BP 140/84 | HR 47 | Temp 97.6°F | Ht 73.0 in | Wt 258.0 lb

## 2017-08-16 DIAGNOSIS — B354 Tinea corporis: Secondary | ICD-10-CM

## 2017-08-16 LAB — POCT SKIN KOH: SKIN KOH, POC: POSITIVE — AB

## 2017-08-16 MED ORDER — TERBINAFINE HCL 250 MG PO TABS: 250.0000 mg | ORAL_TABLET | Freq: Every day | ORAL | 0 refills | Status: DC

## 2017-08-16 NOTE — Progress Notes (Signed)
   Subjective:    Patient ID: DIN BOOKWALTER, male    DOB: 06-Jun-1940, 77 y.o.   MRN: [id number]  HPI    77 year old male presents with new  Onset rash on right upper chest wall.  Spreading some and increasing in size.  Mildly itchy.   No rash elsewhere, no oral lesions. He may have athletes foot off and on in bilateral feet.  No fever. No sick contacts.  Triall of ketoconazole 2% cream x 30 days... He has not noted any improvement, vccv                                                                                   Review of Systems  Constitutional: Negative for fatigue and fever.  HENT: Negative for ear pain.   Eyes: Negative for pain.  Respiratory: Negative for cough and shortness of breath.   Cardiovascular: Negative for chest pain, palpitations and leg swelling.  Gastrointestinal: Negative for abdominal pain.  Genitourinary: Negative for dysuria.  Musculoskeletal: Negative for arthralgias.  Neurological: Negative for syncope, light-headedness and headaches.  Psychiatric/Behavioral: Negative for dysphoric mood.       Objective:   Physical Exam  Constitutional: Vital signs are normal. He appears well-developed and well-nourished.  HENT:  Head: Normocephalic.  Right Ear: Hearing normal.  Left Ear: Hearing normal.  Nose: Nose normal.  Mouth/Throat: Oropharynx is clear and moist and mucous membranes are normal.  Neck: Trachea normal. Carotid bruit is not present. No thyroid mass and no thyromegaly present.  Cardiovascular: Normal rate, regular rhythm and normal pulses. Exam reveals no gallop, no distant heart sounds and no friction rub.  No murmur heard. No peripheral edema  Pulmonary/Chest: Effort normal and breath sounds normal. No respiratory distress.  Skin: Skin is warm, dry and intact. No rash noted.   Erythematous lesion right chest, leading edge.  Psychiatric: He has a normal mood and affect. His speech is normal and behavior is normal. Thought content  normal.           Assessment & Plan:

## 2017-08-16 NOTE — Patient Instructions (Addendum)
Start terbinafine 250 mg daily x 1-2 weeks. If still not improving call for derm referral.

## 2017-08-29 ENCOUNTER — Encounter: Payer: Self-pay | Admitting: Family Medicine

## 2017-08-29 DIAGNOSIS — R21 Rash and other nonspecific skin eruption: Secondary | ICD-10-CM

## 2017-09-08 ENCOUNTER — Encounter: Payer: Self-pay | Admitting: Family Medicine

## 2017-09-08 DIAGNOSIS — R21 Rash and other nonspecific skin eruption: Secondary | ICD-10-CM | POA: Diagnosis not present

## 2017-09-08 DIAGNOSIS — L309 Dermatitis, unspecified: Secondary | ICD-10-CM | POA: Diagnosis not present

## 2017-09-15 ENCOUNTER — Encounter: Payer: Self-pay | Admitting: Family Medicine

## 2017-09-15 DIAGNOSIS — L531 Erythema annulare centrifugum: Secondary | ICD-10-CM | POA: Diagnosis not present

## 2017-09-15 DIAGNOSIS — R21 Rash and other nonspecific skin eruption: Secondary | ICD-10-CM | POA: Diagnosis not present

## 2017-09-16 NOTE — Telephone Encounter (Signed)
Please forward last set of labs to patient dermatologist Dr. Nehemiah Massed

## 2017-09-22 ENCOUNTER — Other Ambulatory Visit: Payer: Self-pay | Admitting: Family Medicine

## 2017-09-28 ENCOUNTER — Encounter: Payer: Self-pay | Admitting: Family Medicine

## 2017-09-28 DIAGNOSIS — L531 Erythema annulare centrifugum: Secondary | ICD-10-CM | POA: Diagnosis not present

## 2017-10-03 NOTE — Telephone Encounter (Signed)
Copied from [place] [phone]. Topic: Quick Communication - See Telephone Encounter >> Oct 03, 2017  4:27 PM Sheran Luz wrote: CRM for notification. See Telephone encounter for: 10/03/17.  Kennyth Lose from Dr. August Albino office called stating that the pts labs are on their way. Kennyth Lose stated that Dr. Nehemiah Massed wanted Dr. Diona Browner to look at the notes.

## 2017-10-07 ENCOUNTER — Ambulatory Visit (INDEPENDENT_AMBULATORY_CARE_PROVIDER_SITE_OTHER): Payer: Medicare Other | Admitting: Family Medicine

## 2017-10-07 ENCOUNTER — Ambulatory Visit (INDEPENDENT_AMBULATORY_CARE_PROVIDER_SITE_OTHER)
Admission: RE | Admit: 2017-10-07 | Discharge: 2017-10-07 | Disposition: A | Discharge status: Home / Self Care | Payer: Medicare Other | Source: Ambulatory Visit | Attending: Family Medicine | Admitting: Family Medicine

## 2017-10-07 ENCOUNTER — Other Ambulatory Visit: Payer: Self-pay | Admitting: Family Medicine

## 2017-10-07 VITALS — BP 120/80 | HR 64 | Temp 98.3°F | Ht 73.0 in | Wt 256.8 lb

## 2017-10-07 DIAGNOSIS — R0602 Shortness of breath: Secondary | ICD-10-CM | POA: Diagnosis not present

## 2017-10-07 DIAGNOSIS — R053 Chronic cough: Secondary | ICD-10-CM

## 2017-10-07 DIAGNOSIS — L531 Erythema annulare centrifugum: Secondary | ICD-10-CM | POA: Diagnosis not present

## 2017-10-07 DIAGNOSIS — R05 Cough: Secondary | ICD-10-CM | POA: Diagnosis not present

## 2017-10-07 DIAGNOSIS — Z125 Encounter for screening for malignant neoplasm of prostate: Secondary | ICD-10-CM

## 2017-10-07 LAB — CBC WITH DIFFERENTIAL/PLATELET
BASOS PCT: 0.9 % (ref 0.0–3.0)
Basophils Absolute: 0 10*3/uL (ref 0.0–0.1)
EOS PCT: 3.2 % (ref 0.0–5.0)
Eosinophils Absolute: 0.1 10*3/uL (ref 0.0–0.7)
HCT: 42.5 % (ref 39.0–52.0)
Hemoglobin: 13.9 g/dL (ref 13.0–17.0)
LYMPHS ABS: 1 10*3/uL (ref 0.7–4.0)
Lymphocytes Relative: 27.5 % (ref 12.0–46.0)
MCHC: 32.7 g/dL (ref 30.0–36.0)
MCV: 92.6 fl (ref 78.0–100.0)
MONO ABS: 0.4 10*3/uL (ref 0.1–1.0)
Monocytes Relative: 10.1 % (ref 3.0–12.0)
NEUTROS PCT: 58.3 % (ref 43.0–77.0)
Neutro Abs: 2.2 10*3/uL (ref 1.4–7.7)
PLATELETS: 181 10*3/uL (ref 150.0–400.0)
RBC: 4.59 Mil/uL (ref 4.22–5.81)
RDW: 12.6 % (ref 11.5–15.5)
WBC: 3.8 10*3/uL — ABNORMAL LOW (ref 4.0–10.5)

## 2017-10-07 LAB — PSA, MEDICARE: PSA: 5.55 ng/mL — AB (ref 0.10–4.00)

## 2017-10-07 NOTE — Assessment & Plan Note (Signed)
Will eval with cbc. EAC has occurred in association with a variety of conditions, including diseases, drug exposure, and food exposure. In many patients, an associated condition cannot be identified.  No clear cause at this point in this pt.

## 2017-10-07 NOTE — Progress Notes (Signed)
   Subjective:    Patient ID: Jason Solomon, male    DOB: [birth year], 77 y.o.   MRN: [id number]  HPI  77 year old male presents following dx with  Erythema annulare centrifugum. Neg lyme testing, etc.  Needs malignancy work up. Nml CMET... Needs cbc  Nml cologuard 05/2015 PSA followed by urology yearly. Dr, Sharin Grave. Stable since 2013 around 4-5.  No fatigue He has felt a little more shortness of breath working in yard.   Mild cough at night 3-4 nights a week..   Quit in 1990, but smoked 30 years 2 ppd.  Family history : brother with lung cancer      Review of Systems  Constitutional: Negative for fatigue and fever.  HENT: Negative for ear pain.   Eyes: Negative for pain.  Respiratory: Negative for cough and shortness of breath.   Cardiovascular: Negative for chest pain, palpitations and leg swelling.  Gastrointestinal: Negative for abdominal pain.  Genitourinary: Negative for dysuria.  Musculoskeletal: Negative for arthralgias.  Neurological: Negative for syncope, light-headedness and headaches.  Psychiatric/Behavioral: Negative for dysphoric mood.       Objective:   Physical Exam  Constitutional: Vital signs are normal. He appears well-developed and well-nourished.  HENT:  Head: Normocephalic.  Right Ear: Hearing normal.  Left Ear: Hearing normal.  Nose: Nose normal.  Mouth/Throat: Oropharynx is clear and moist and mucous membranes are normal.  Neck: Trachea normal. Carotid bruit is not present. No thyroid mass and no thyromegaly present.  Cardiovascular: Normal rate, regular rhythm and normal pulses. Exam reveals no gallop, no distant heart sounds and no friction rub.  No murmur heard. No peripheral edema  Pulmonary/Chest: Effort normal and breath sounds normal. No respiratory distress.  Skin: Skin is warm, dry and intact. No rash noted.   Erythematous lesion right chest,  And left upper abdomen, erythematous edge, cleared center, rolled border  Psychiatric: He  has a normal mood and affect. His speech is normal and behavior is normal. Thought content normal.          Assessment & Plan:

## 2017-10-13 ENCOUNTER — Ambulatory Visit
Admission: RE | Admit: 2017-10-13 | Discharge: 2017-10-13 | Disposition: A | Discharge status: Home / Self Care | Payer: Medicare Other | Source: Ambulatory Visit | Attending: Family Medicine | Admitting: Family Medicine

## 2017-10-13 DIAGNOSIS — I700 Atherosclerosis of aorta: Secondary | ICD-10-CM | POA: Diagnosis not present

## 2017-10-13 DIAGNOSIS — L531 Erythema annulare centrifugum: Secondary | ICD-10-CM | POA: Diagnosis not present

## 2017-10-13 DIAGNOSIS — R05 Cough: Secondary | ICD-10-CM | POA: Insufficient documentation

## 2017-10-13 DIAGNOSIS — R053 Chronic cough: Secondary | ICD-10-CM

## 2017-10-18 ENCOUNTER — Other Ambulatory Visit: Payer: Self-pay | Admitting: Family Medicine

## 2017-10-18 MED ORDER — ATORVASTATIN CALCIUM 20 MG PO TABS: 20.0000 mg | ORAL_TABLET | Freq: Every day | ORAL | 3 refills | Status: DC

## 2017-10-18 MED ORDER — ATORVASTATIN CALCIUM 20 MG PO TABS: 20.0000 mg | ORAL_TABLET | Freq: Every day | ORAL | 11 refills | Status: DC

## 2017-10-20 ENCOUNTER — Other Ambulatory Visit: Payer: Self-pay | Admitting: Family Medicine

## 2017-10-20 DIAGNOSIS — R0609 Other forms of dyspnea: Secondary | ICD-10-CM

## 2017-10-20 MED ORDER — AZITHROMYCIN 250 MG PO TABS: ORAL_TABLET | ORAL | 0 refills | Status: DC

## 2017-10-26 ENCOUNTER — Telehealth (HOSPITAL_COMMUNITY): Payer: Self-pay | Admitting: *Deleted

## 2017-10-26 NOTE — Telephone Encounter (Signed)
Left message on voicemail per DPR in reference to upcoming appointment scheduled on 10/31/17 with detailed instructions given per Myocardial Perfusion Study Information Sheet for the test. LM to arrive 15 minutes early, and that it is imperative to arrive on time for appointment to keep from having the test rescheduled. If you need to cancel or reschedule your appointment, please call the office within 24 hours of your appointment. Failure to do so may result in a cancellation of your appointment, and a $50 no show fee. Phone number given for call back for any questions. Kirstie Peri

## 2017-10-31 ENCOUNTER — Encounter: Payer: Self-pay | Admitting: Family Medicine

## 2017-10-31 ENCOUNTER — Ambulatory Visit (HOSPITAL_COMMUNITY): Payer: Medicare Other | Attending: Internal Medicine

## 2017-10-31 DIAGNOSIS — R0609 Other forms of dyspnea: Secondary | ICD-10-CM | POA: Diagnosis not present

## 2017-10-31 LAB — MYOCARDIAL PERFUSION IMAGING
LV dias vol: 169 mL (ref 62–150)
LVSYSVOL: 99 mL
Peak HR: 96 {beats}/min
Rest HR: 52 {beats}/min
SDS: 3
SRS: 1
SSS: 4
TID: 1.17

## 2017-10-31 MED ORDER — TECHNETIUM TC 99M TETROFOSMIN IV KIT
10.7000 | PACK | Freq: Once | INTRAVENOUS | Status: AC | PRN
  Administered 2017-10-31: 10.7 via INTRAVENOUS
  Filled 2017-10-31: qty 11

## 2017-10-31 MED ORDER — REGADENOSON 0.4 MG/5ML IV SOLN
0.4000 mg | Freq: Once | INTRAVENOUS | Status: AC
  Administered 2017-10-31: 0.4 mg via INTRAVENOUS

## 2017-10-31 MED ORDER — TECHNETIUM TC 99M TETROFOSMIN IV KIT
30.7000 | PACK | Freq: Once | INTRAVENOUS | Status: AC | PRN
  Administered 2017-10-31: 30.7 via INTRAVENOUS
  Filled 2017-10-31: qty 31

## 2017-10-31 NOTE — Assessment & Plan Note (Signed)
Given cough, smoking historya nd need for malignancy work up, I will refer for ct chest

## 2017-11-01 ENCOUNTER — Other Ambulatory Visit: Payer: Self-pay | Admitting: Family Medicine

## 2017-11-01 DIAGNOSIS — R9439 Abnormal result of other cardiovascular function study: Secondary | ICD-10-CM

## 2017-11-01 DIAGNOSIS — R0609 Other forms of dyspnea: Secondary | ICD-10-CM

## 2017-11-01 NOTE — Progress Notes (Signed)
Wt Readings from Last 3 Encounters:  10/31/17 256 lb (116.1 kg)  10/07/17 256 lb 12 oz (116.5 kg)  08/16/17 258 lb (117 kg)

## 2017-11-02 ENCOUNTER — Telehealth: Payer: Self-pay | Admitting: *Deleted

## 2017-11-02 ENCOUNTER — Telehealth: Payer: Self-pay | Admitting: Family Medicine

## 2017-11-02 NOTE — Telephone Encounter (Signed)
Copied from [place]. Topic: Quick Communication - See Telephone Encounter >> Nov 02, 2017  2:07 PM Burchel, Abbi R wrote: CRM for notification. See Telephone encounter for: 11/02/17.  Pt states he received a call from [place] regarding the results of his recent testing, but was unable to speak with someone directly.  He has some questions and would like a call back to discuss the results.    Pt: [phone]

## 2017-11-02 NOTE — Telephone Encounter (Signed)
Follow up:    Patient returning call about results

## 2017-11-04 NOTE — Telephone Encounter (Signed)
Call back to find out questions. If unable to answer let me know and I can call him at end of day.

## 2017-11-04 NOTE — Telephone Encounter (Signed)
Patient would like results on stress test and also additional testing Dr. Diona Browner had ordered. Patient would like a call back by Dr, Diona Browner.

## 2017-11-04 NOTE — Telephone Encounter (Signed)
Reviewed results in detail with pt. He will call next week to schedule ECHO to eval EF of heart.

## 2017-11-07 NOTE — Telephone Encounter (Signed)
Appt made and patient aware.  

## 2017-11-15 ENCOUNTER — Ambulatory Visit (INDEPENDENT_AMBULATORY_CARE_PROVIDER_SITE_OTHER): Payer: Medicare Other | Admitting: Family Medicine

## 2017-11-15 ENCOUNTER — Encounter: Payer: Self-pay | Admitting: Family Medicine

## 2017-11-15 ENCOUNTER — Encounter

## 2017-11-15 VITALS — BP 118/70 | HR 47 | Temp 97.7°F | Ht 73.0 in | Wt 257.2 lb

## 2017-11-15 DIAGNOSIS — J441 Chronic obstructive pulmonary disease with (acute) exacerbation: Secondary | ICD-10-CM | POA: Diagnosis not present

## 2017-11-15 MED ORDER — DOXYCYCLINE HYCLATE 100 MG PO TABS: 100.0000 mg | ORAL_TABLET | Freq: Two times a day (BID) | ORAL | 0 refills | Status: DC

## 2017-11-15 MED ORDER — ALBUTEROL SULFATE HFA 108 (90 BASE) MCG/ACT IN AERS: 2.0000 | INHALATION_SPRAY | Freq: Four times a day (QID) | RESPIRATORY_TRACT | 2 refills | Status: DC | PRN

## 2017-11-15 MED ORDER — PREDNISONE 10 MG PO TABS: ORAL_TABLET | ORAL | 0 refills | Status: DC

## 2017-11-15 NOTE — Assessment & Plan Note (Signed)
Doxy x 7 days to treat atypical bacterial. Pred taper low dose for COPD exac, albuterol prn.

## 2017-11-15 NOTE — Patient Instructions (Addendum)
Complete prednisone taper.  Complete anitbitioics x 7 days.  Albuterol as needed for wheeze and cough.  Rest., fluids.

## 2017-11-15 NOTE — Progress Notes (Signed)
   Subjective:    Patient ID: Jason Solomon, male    DOB: [birth year], 77 y.o.   MRN: [id number]  HPI    Review of Systems     Objective:   Physical Exam        Assessment & Plan:

## 2017-11-15 NOTE — Progress Notes (Signed)
   Subjective:    Patient ID: Jason Solomon, male    DOB: 01/11/1941, 77 y.o.   MRN: [id number]  Cough  This is a new problem. The current episode started more than 1 month ago. The problem has been unchanged. The cough is productive of purulent sputum. Associated symptoms include shortness of breath and wheezing. Pertinent negatives include no chills, ear pain, fever, nasal congestion or sore throat. The symptoms are aggravated by lying down. Risk factors for lung disease include smoking/tobacco exposure. He has tried OTC cough suppressant for the symptoms. The treatment provided moderate relief. His past medical history is significant for COPD and environmental allergies. There is no history of asthma or bronchiectasis.  Wheezing   Associated symptoms include coughing and shortness of breath. Pertinent negatives include no chills, ear pain, fever or sore throat. His past medical history is significant for COPD. There is no history of asthma.    Got  70% better after Z-pack 10/20/2017   Taking zyrtec  At bedtime.  Review of Systems  Constitutional: Negative for chills and fever.  HENT: Negative for ear pain and sore throat.   Respiratory: Positive for cough, shortness of breath and wheezing.   Allergic/Immunologic: Positive for environmental allergies.       Objective:   Physical Exam  Constitutional: Vital signs are normal. He appears well-developed and well-nourished.  Non-toxic appearance. He does not appear ill. No distress.  HENT:  Head: Normocephalic and atraumatic.  Right Ear: Hearing, tympanic membrane, external ear and ear canal normal. No tenderness. No foreign bodies. Tympanic membrane is not retracted and not bulging.  Left Ear: Hearing, tympanic membrane, external ear and ear canal normal. No tenderness. No foreign bodies. Tympanic membrane is not retracted and not bulging.  Nose: Nose normal. No mucosal edema or rhinorrhea. Right sinus exhibits no maxillary sinus  tenderness and no frontal sinus tenderness. Left sinus exhibits no maxillary sinus tenderness and no frontal sinus tenderness.  Mouth/Throat: Uvula is midline, oropharynx is clear and moist and mucous membranes are normal. Normal dentition. No dental caries. No oropharyngeal exudate or tonsillar abscesses.  Eyes: Pupils are equal, round, and reactive to light. Conjunctivae, EOM and lids are normal. Lids are everted and swept, no foreign bodies found.  Neck: Trachea normal, normal range of motion and phonation normal. Neck supple. Carotid bruit is not present. No thyroid mass and no thyromegaly present.  Cardiovascular: Normal rate, regular rhythm, S1 normal, S2 normal, normal heart sounds, intact distal pulses and normal pulses. Exam reveals no gallop.  No murmur heard. Pulmonary/Chest: Effort normal. No respiratory distress. He has wheezes. He has no rhonchi. He has no rales.  Abdominal: Soft. Normal appearance and bowel sounds are normal. There is no hepatosplenomegaly. There is no tenderness. There is no rebound, no guarding and no CVA tenderness. No hernia.  Neurological: He is alert. He has normal reflexes.  Skin: Skin is warm, dry and intact. No rash noted.  Psychiatric: He has a normal mood and affect. His speech is normal and behavior is normal. Judgment normal.          Assessment & Plan:

## 2017-11-18 ENCOUNTER — Other Ambulatory Visit: Payer: Self-pay

## 2017-11-18 ENCOUNTER — Ambulatory Visit (INDEPENDENT_AMBULATORY_CARE_PROVIDER_SITE_OTHER): Payer: Medicare Other

## 2017-11-18 DIAGNOSIS — R9439 Abnormal result of other cardiovascular function study: Secondary | ICD-10-CM

## 2017-11-18 DIAGNOSIS — R0609 Other forms of dyspnea: Secondary | ICD-10-CM

## 2017-12-01 ENCOUNTER — Ambulatory Visit (INDEPENDENT_AMBULATORY_CARE_PROVIDER_SITE_OTHER): Payer: Medicare Other

## 2017-12-01 DIAGNOSIS — Z23 Encounter for immunization: Secondary | ICD-10-CM | POA: Diagnosis not present

## 2018-01-12 ENCOUNTER — Other Ambulatory Visit: Payer: Self-pay

## 2018-01-31 ENCOUNTER — Telehealth: Payer: Self-pay | Admitting: Family Medicine

## 2018-01-31 DIAGNOSIS — E7800 Pure hypercholesterolemia, unspecified: Secondary | ICD-10-CM

## 2018-01-31 DIAGNOSIS — R972 Elevated prostate specific antigen [PSA]: Secondary | ICD-10-CM

## 2018-01-31 DIAGNOSIS — M1A00X0 Idiopathic chronic gout, unspecified site, without tophus (tophi): Secondary | ICD-10-CM

## 2018-01-31 NOTE — Telephone Encounter (Signed)
-----   Message from Eustace Pen, LPN sent at 17/08/9388  3:49 PM EST ----- Regarding: Labs 12/11 Lab orders needed. Thank you.  Insurance:  [employer]

## 2018-02-01 ENCOUNTER — Ambulatory Visit (INDEPENDENT_AMBULATORY_CARE_PROVIDER_SITE_OTHER): Payer: Medicare Other

## 2018-02-01 VITALS — BP 128/84 | HR 58 | Temp 97.8°F | Ht 73.75 in | Wt 262.0 lb

## 2018-02-01 DIAGNOSIS — M1A00X0 Idiopathic chronic gout, unspecified site, without tophus (tophi): Secondary | ICD-10-CM

## 2018-02-01 DIAGNOSIS — Z0000 Encounter for general adult medical examination without abnormal findings: Secondary | ICD-10-CM | POA: Diagnosis not present

## 2018-02-01 DIAGNOSIS — E7800 Pure hypercholesterolemia, unspecified: Secondary | ICD-10-CM | POA: Diagnosis not present

## 2018-02-01 LAB — COMPREHENSIVE METABOLIC PANEL
ALT: 28 U/L (ref 0–53)
AST: 27 U/L (ref 0–37)
Albumin: 3.9 g/dL (ref 3.5–5.2)
Alkaline Phosphatase: 43 U/L (ref 39–117)
BUN: 18 mg/dL (ref 6–23)
CALCIUM: 9.4 mg/dL (ref 8.4–10.5)
CO2: 33 meq/L — AB (ref 19–32)
CREATININE: 1.08 mg/dL (ref 0.40–1.50)
Chloride: 102 mEq/L (ref 96–112)
GFR: 85.23 mL/min (ref >60.00)
GLUCOSE: 105 mg/dL — AB (ref 70–99)
POTASSIUM: 3.6 meq/L (ref 3.5–5.1)
SODIUM: 140 meq/L (ref 135–145)
TOTAL PROTEIN: 6.8 g/dL (ref 6.0–8.3)
Total Bilirubin: 1.1 mg/dL (ref 0.2–1.2)

## 2018-02-01 LAB — LIPID PANEL
CHOL/HDL RATIO: 2
Cholesterol: 114 mg/dL (ref 0–200)
HDL: 55.2 mg/dL (ref >39.00)
LDL CALC: 44 mg/dL (ref 0–99)
NonHDL: 58.7
TRIGLYCERIDES: 75 mg/dL (ref 0.0–149.0)
VLDL: 15 mg/dL (ref 0.0–40.0)

## 2018-02-01 LAB — URIC ACID: URIC ACID, SERUM: 4.8 mg/dL (ref 4.0–7.8)

## 2018-02-01 NOTE — Patient Instructions (Signed)
Jason Solomon , Thank you for taking time to come for your Medicare Wellness Visit. I appreciate your ongoing commitment to your health goals. Please review the following plan we discussed and let me know if I can assist you in the future.   These are the goals we discussed: Goals    . Increase physical activity     Starting 02/01/2018, I will continue to exercise for 45-60 minutes 3 days per week and to bowl or golf as desired.         This is a list of the screening recommended for you and due dates:  Health Maintenance  Topic Date Due  . Tetanus Vaccine  02/22/2019*  . Flu Shot  Completed  . Pneumonia vaccines  Completed  *Topic was postponed. The date shown is not the original due date.   Preventive Care for Adults  A healthy lifestyle and preventive care can promote health and wellness. Preventive health guidelines for adults include the following key practices.  . A routine yearly physical is a good way to check with your health care provider about your health and preventive screening. It is a chance to share any concerns and updates on your health and to receive a thorough exam.  . Visit your dentist for a routine exam and preventive care every 6 months. Brush your teeth twice a day and floss once a day. Good oral hygiene prevents tooth decay and gum disease.  . The frequency of eye exams is based on your age, health, family medical history, use  of contact lenses, and other factors. Follow your health care provider's recommendations for frequency of eye exams.  . Eat a healthy diet. Foods like vegetables, fruits, whole grains, low-fat dairy products, and lean protein foods contain the nutrients you need without too many calories. Decrease your intake of foods high in solid fats, added sugars, and salt. Eat the right amount of calories for you. Get information about a proper diet from your health care provider, if necessary.  . Regular physical exercise is one of the most  important things you can do for your health. Most adults should get at least 150 minutes of moderate-intensity exercise (any activity that increases your heart rate and causes you to sweat) each week. In addition, most adults need muscle-strengthening exercises on 2 or more days a week.  Silver Sneakers may be a benefit available to you. To determine eligibility, you may visit the website: www.silversneakers.com or contact program at (859)664-1380 Mon-Fri between 8AM-8PM.   . Maintain a healthy weight. The body mass index (BMI) is a screening tool to identify possible weight problems. It provides an estimate of body fat based on height and weight. Your health care provider can find your BMI and can help you achieve or maintain a healthy weight.   For adults 20 years and older: ? A BMI below 18.5 is considered underweight. ? A BMI of 18.5 to 24.9 is normal. ? A BMI of 25 to 29.9 is considered overweight. ? A BMI of 30 and above is considered obese.   . Maintain normal blood lipids and cholesterol levels by exercising and minimizing your intake of saturated fat. Eat a balanced diet with plenty of fruit and vegetables. Blood tests for lipids and cholesterol should begin at age 43 and be repeated every 5 years. If your lipid or cholesterol levels are high, you are over 50, or you are at high risk for heart disease, you may need your cholesterol levels checked  more frequently. Ongoing high lipid and cholesterol levels should be treated with medicines if diet and exercise are not working.  . If you smoke, find out from your health care provider how to quit. If you do not use tobacco, please do not start.  . If you choose to drink alcohol, please do not consume more than 2 drinks per day. One drink is considered to be 12 ounces (355 mL) of beer, 5 ounces (148 mL) of wine, or 1.5 ounces (44 mL) of liquor.  . If you are 12-72 years old, ask your health care provider if you should take aspirin to prevent  strokes.  . Use sunscreen. Apply sunscreen liberally and repeatedly throughout the day. You should seek shade when your shadow is shorter than you. Protect yourself by wearing long sleeves, pants, a wide-brimmed hat, and sunglasses year round, whenever you are outdoors.  . Once a month, do a whole body skin exam, using a mirror to look at the skin on your back. Tell your health care provider of new moles, moles that have irregular borders, moles that are larger than a pencil eraser, or moles that have changed in shape or color.

## 2018-02-01 NOTE — Progress Notes (Signed)
Subjective:   Jason Solomon is a 77 y.o. male who presents for Medicare Annual/Subsequent preventive examination.  Review of Systems:  N/A Cardiac Risk Factors include: advanced age (>43men, >31 women);dyslipidemia;hypertension;male gender;obesity (BMI >30kg/m2)     Objective:    Vitals: BP 128/84 (BP Location: Right Arm, Patient Position: Sitting, Cuff Size: Large)   Pulse (!) 58   Temp 97.8 F (36.6 C) (Oral)   Ht 6' 1.75" (1.873 m) Comment: shoes  Wt 262 lb (118.8 kg)   SpO2 98%   BMI 33.87 kg/m   Body mass index is 33.87 kg/m.  Advanced Directives 02/01/2018 01/28/2017 01/07/2016 01/02/2015 10/03/2014 09/23/2014  Does Patient Have a Medical Advance Directive? No Yes Yes No No No  Type of Advance Directive - [place];Living will [place];Living will - - -  Does patient want to make changes to medical advance directive? - - No - Patient declined - - -  Copy of [place] in Chart? - No - copy requested No - copy requested - - -  Would patient like information on creating a medical advance directive? No - Patient declined - - No - patient declined information Yes Higher education careers adviser given -    Tobacco Social History   Tobacco Use  Smoking Status Former Smoker  . Last attempt to quit: 02/23/1988  . Years since quitting: 29.9  Smokeless Tobacco Never Used     Counseling given: No   Clinical Intake:  Pre-visit preparation completed: Yes  Pain : No/denies pain Pain Score: 0-No pain     Nutritional Status: BMI > 30  Obese Nutritional Risks: None Diabetes: No  How often do you need to have someone help you when you read instructions, pamphlets, or other written materials from your doctor or pharmacy?: 1 - Never What is the last grade level you completed in school?: Master degree  Interpreter Needed?: No  Comments: pt lives with spouse Information entered by :: LPinson, LPN  Past Medical History:    Diagnosis Date  . 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 DIVERTICULOSIS, COLON 01/12/2007  . 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 GERD (gastroesophageal reflux disease)   . GOITER, MULTINODULAR 05/04/2010  . GOUT 01/12/2007  . Heart murmur    childhood only  . HYPERCHOLESTEROLEMIA 06/20/2007  . HYPERTENSION 01/12/2007  . HYPERTHYROIDISM 04/24/2010  . OBESITY 01/12/2007  . PROSTATE SPECIFIC ANTIGEN, ELEVATED 10/08/2009  . Renal calculi   . Shortness of breath dyspnea    with exertion   Past Surgical History:  Procedure Laterality Date  . CARDIOVASCULAR STRESS TEST  2006   negative  . HAND SURGERY  1949   left hand laceration  . HERNIA REPAIR  1990's   right and left inguinal hernia  . KNEE SURGERY  1990's  . TONSILLECTOMY  1945   Family History  Problem Relation Age of Onset  . Hypertension Mother   . COPD Brother   . Lung cancer Brother 66  . Throat cancer Brother   . Thyroid disease Neg Hx    Social History   Socioeconomic History  . Marital status: Married    Spouse name: Not on file  . Number of children: 4  . Years of education: Not on file  . Highest education level: Not on file  Occupational History    Employer: RETIRED    Comment: Retired  Scientific laboratory technician  . Financial resource strain: Not on file  .  Food insecurity:    Worry: Not on file    Inability: Not on file  . Transportation needs:    Medical: Not on file    Non-medical: Not on file  Tobacco Use  . Smoking status: Former Smoker    Last attempt to quit: 02/23/1988    Years since quitting: 29.9  . Smokeless tobacco: Never Used  Substance and Sexual Activity  . Alcohol use: Yes    Alcohol/week: 7.0 standard drinks    Types: 7 Glasses of wine per week    Comment: moderate-1 glass of wine daily, occ. scotch  . Drug use: No  . Sexual activity: Never  Lifestyle  . Physical activity:    Days per week: Not on file    Minutes per session: Not on file  . Stress: Not on file   Relationships  . Social connections:    Talks on phone: Not on file    Gets together: Not on file    Attends religious service: Not on file    Active member of club or organization: Not on file    Attends meetings of clubs or organizations: Not on file    Relationship status: Not on file  Other Topics Concern  . Not on file  Social History Narrative   Regular exercise-yes, walks , golf   Occupation: Retired Hydrographic surveyor   Diet: (+) fruit, (+) veggies, (+) salad, (+) H20   Children: 4, healthy   Married x 43 years   Living will, [place], daughter. Full code. (reviewed 2013)    Outpatient Encounter Medications as of 02/01/2018  Medication Sig  . albuterol (PROVENTIL HFA;VENTOLIN HFA) 108 (90 Base) MCG/ACT inhaler Inhale 2 puffs into the lungs every 6 (six) hours as needed for wheezing or shortness of breath.  . allopurinol (ZYLOPRIM) 300 MG tablet TAKE 1 TABLET BY MOUTH  DAILY  . atorvastatin (LIPITOR) 20 MG tablet Take 1 tablet (20 mg total) by mouth daily.  . Calcium Carb-Cholecalciferol (CALCIUM 1000 + D PO) Take by mouth.  . cetirizine (ZYRTEC ALLERGY) 10 MG tablet Take 10 mg by mouth daily.   . hydrochlorothiazide (HYDRODIURIL) 25 MG tablet TAKE 1 TABLET BY MOUTH  DAILY  . levothyroxine (SYNTHROID, LEVOTHROID) 137 MCG tablet TAKE 1 TABLET BY MOUTH  DAILY BEFORE BREAKFAST  . mometasone (NASONEX) 50 MCG/ACT nasal spray Place 2 sprays into the nose as needed.  . montelukast (SINGULAIR) 10 MG tablet Take 1 tablet (10 mg total) by mouth daily as needed. Only when flying  . polyethylene glycol (MIRALAX / GLYCOLAX) packet Take 17 g by mouth daily as needed.  . sildenafil (VIAGRA) 100 MG tablet Take 1 tablet (100 mg total) by mouth daily as needed for erectile dysfunction.  Marland Kitchen terazosin (HYTRIN) 5 MG capsule TAKE 1 CAPSULE BY MOUTH  DAILY  . [DISCONTINUED] doxycycline (VIBRA-TABS) 100 MG tablet Take 1 tablet (100 mg total) by mouth 2 (two) times daily.  .  [DISCONTINUED] predniSONE (DELTASONE) 10 MG tablet 3 tabs by mouth daily x 3 days, then 2 tabs by mouth daily x 2 days then 1 tab by mouth daily x 2 days   No facility-administered encounter medications on file as of 02/01/2018.     Activities of Daily Living In your present state of health, do you have any difficulty performing the following activities: 02/01/2018  Hearing? N  Vision? N  Difficulty concentrating or making decisions? N  Walking or climbing stairs? N  Dressing or bathing?  N  Doing errands, shopping? N  Preparing Food and eating ? N  Using the Toilet? N  In the past six months, have you accidently leaked urine? N  Do you have problems with loss of bowel control? N  Managing your Medications? N  Managing your Finances? N  Housekeeping or managing your Housekeeping? N  Some recent data might be hidden    Patient Care Team: Jinny Sanders, MD as PCP - General Birder Robson, MD as Referring Physician (Ophthalmology) Renato Shin, MD as Consulting Physician (Endocrinology) Nickie Retort, MD as Consulting Physician (Urology)   Assessment:   This is a routine wellness examination for Kolbi.   Hearing Screening   125Hz  250Hz  500Hz  1000Hz  2000Hz  3000Hz  4000Hz  6000Hz  8000Hz   Right ear:   0 0 40  40    Left ear:   0 0 0  0    Vision Screening Comments: Vision exam in April 2019 with Dr. George Ina   Exercise Activities and Dietary recommendations Current Exercise Habits: Home exercise routine, Type of exercise: Other - see comments;strength training/weights;stretching(golf, bowling), Time (Minutes): 60, Frequency (Times/Week): 3, Weekly Exercise (Minutes/Week): 180, Intensity: Moderate, Exercise limited by: None identified  Goals    . Increase physical activity     Starting 02/01/2018, I will continue to exercise for 45-60 minutes 3 days per week and to bowl or golf as desired.         Fall Risk Fall Risk  02/01/2018 01/28/2017 01/07/2016 01/10/2015  01/01/2014  Falls in the past year? 0 No No No No   Depression Screen PHQ 2/9 Scores 02/01/2018 01/28/2017 01/07/2016 01/10/2015  PHQ - 2 Score 0 0 0 0  PHQ- 9 Score 0 - - -    Cognitive Function MMSE - Mini Mental State Exam 02/01/2018 01/28/2017 01/07/2016  Orientation to time 5 5 5   Orientation to Place 5 5 5   Registration 3 3 3   Attention/ Calculation 0 5 0  Recall 3 2 3   Language- name 2 objects 0 2 0  Language- repeat 1 1 1   Language- follow 3 step command 3 3 3   Language- read & follow direction 0 1 0  Write a sentence 0 1 0  Copy design 0 1 0  Total score 20 29 20        PLEASE NOTE: A Mini-Cog screen was completed. Maximum score is 20. A value of 0 denotes this part of [place] MMSE was not completed or the patient failed this part of the Mini-Cog screening.   Mini-Cog Screening Orientation to Time - Max 5 pts Orientation to Place - Max 5 pts Registration - Max 3 pts Recall - Max 3 pts Language Repeat - Max 1 pts Language Follow 3 Step Command - Max 3 pts   Immunization History  Administered Date(s) Administered  . H1N1 04/18/2008  . Influenza Split 11/24/2010, 11/25/2011  . Influenza Whole 12/16/2006, 11/20/2007, 12/04/2008, 11/26/2009  . Influenza,inj,Quad PF,6+ Mos 10/31/2012, 11/20/2013, 11/29/2014, 11/25/2015, 11/30/2016, 11/30/2016, 12/01/2017  . Pneumococcal Conjugate-13 01/01/2014  . Pneumococcal Polysaccharide-23 07/30/2008  . Td 04/23/2006  . Zoster 11/16/2012   Screening Tests Health Maintenance  Topic Date Due  . TETANUS/TDAP  02/22/2019 (Originally 04/22/2016)  . INFLUENZA VACCINE  Completed  . PNA vac Low Risk Adult  Completed     Plan:     I have personally reviewed, addressed, and noted the following in the patient's chart:  A. Medical and social history B. Use of alcohol, tobacco or illicit drugs  C.  Current medications and supplements D. Functional ability and status E.  Nutritional status F.  Physical activity G. Advance  directives H. List of other physicians I.  Hospitalizations, surgeries, and ER visits in previous 12 months J.  [place] to include hearing, vision, cognitive, depression L. Referrals and appointments - none  In addition, I have reviewed and discussed with patient certain preventive protocols, quality metrics, and best practice recommendations. A written personalized care plan for preventive services as well as general preventive health recommendations were provided to patient.  See attached scanned questionnaire for additional information.   Signed,   Lindell Noe, MHA, BS, LPN Health Coach

## 2018-02-01 NOTE — Progress Notes (Signed)
PCP notes:   Health maintenance:  Tetanus vaccine - postponed/insurance  Abnormal screenings:   Hearing - failed  Hearing Screening   125Hz  250Hz  500Hz  1000Hz  2000Hz  3000Hz  4000Hz  6000Hz  8000Hz   Right ear:   0 0 40  40    Left ear:   0 0 0  0     Patient concerns:   None  Nurse concerns:  None  Next PCP appt:   02/07/18 @ 1445

## 2018-02-03 NOTE — Progress Notes (Signed)
I reviewed health advisor's note, was available for consultation, and agree with documentation and plan.  

## 2018-02-07 ENCOUNTER — Encounter: Payer: Self-pay | Admitting: Family Medicine

## 2018-02-07 ENCOUNTER — Ambulatory Visit (INDEPENDENT_AMBULATORY_CARE_PROVIDER_SITE_OTHER): Payer: Medicare Other | Admitting: Family Medicine

## 2018-02-07 VITALS — BP 130/72 | HR 52 | Temp 97.5°F | Ht 73.75 in | Wt 262.8 lb

## 2018-02-07 DIAGNOSIS — E6609 Other obesity due to excess calories: Secondary | ICD-10-CM | POA: Diagnosis not present

## 2018-02-07 DIAGNOSIS — Z6833 Body mass index (BMI) 33.0-33.9, adult: Secondary | ICD-10-CM | POA: Diagnosis not present

## 2018-02-07 DIAGNOSIS — J449 Chronic obstructive pulmonary disease, unspecified: Secondary | ICD-10-CM

## 2018-02-07 DIAGNOSIS — M1A00X0 Idiopathic chronic gout, unspecified site, without tophus (tophi): Secondary | ICD-10-CM

## 2018-02-07 DIAGNOSIS — E7800 Pure hypercholesterolemia, unspecified: Secondary | ICD-10-CM

## 2018-02-07 DIAGNOSIS — I10 Essential (primary) hypertension: Secondary | ICD-10-CM | POA: Diagnosis not present

## 2018-02-07 DIAGNOSIS — M5416 Radiculopathy, lumbar region: Secondary | ICD-10-CM

## 2018-02-07 DIAGNOSIS — L219 Seborrheic dermatitis, unspecified: Secondary | ICD-10-CM

## 2018-02-07 DIAGNOSIS — E890 Postprocedural hypothyroidism: Secondary | ICD-10-CM | POA: Diagnosis not present

## 2018-02-07 MED ORDER — KETOCONAZOLE 2 % EX CREA: 1.0000 "application " | TOPICAL_CREAM | Freq: Every day | CUTANEOUS | 0 refills | Status: DC

## 2018-02-07 NOTE — Patient Instructions (Addendum)
Start home physical therapy for back.  Use bicycle or elliptical instead of treadmill. Start ketoconazole cream on rash and in skin folds.

## 2018-02-07 NOTE — Progress Notes (Signed)
Subjective:    Patient ID: Jason Solomon, male    DOB: [birth year], 77 y.o.   MRN: [id number]  HPI The patient presents for  complete physical and review of chronic health problems. He/She also has the following acute concerns today: 1. New rash on  Bilateral nasal area..where  glasses rub, slightly itchy.  Dry flaky  2.Intermittant right low back pain.. radiates to right leg and foot, intermittant numbness in calf and foot. Ache in right buttock. 2-3 onpain scale.  no weakness, no fever, no incontinence.  Has not tried anything for the pain.  The patient saw Candis Musa, LPN for medicare wellness. Note reviewed in detail and important notes copied below.  Health maintenance:  Tetanus vaccine - postponed/insurance  Abnormal screenings:   Hearing - failed             Hearing Screening   125Hz  250Hz  500Hz  1000Hz  2000Hz  3000Hz  4000Hz  6000Hz  8000Hz   Right ear:   0 0 40  40    Left ear:   0 0 0  0     Patient concerns:   None  Nurse concerns:  None  02/07/18  Today: Hypertension:    good control on HCTZ. Using medication without problems or lightheadedness:  none Chest pain with exertion:none Edema:none Short of breath: stable. Average home BPs: Other issues:  Elevated Cholesterol:  LDL at goal on atorvastatin 20 mg dialy Lab Results  Component Value Date   CHOL 114 02/01/2018   HDL 55.20 02/01/2018   LDLCALC 44 02/01/2018   TRIG 75.0 02/01/2018   CHOLHDL 2 02/01/2018  Using medications without problems: Muscle aches:  Diet compliance: moderate Exercise: several times a week.. does have SOB Other complaints:  COPD, mild,stable. Use albuterol prn.  Thyroid issues followed by Dr. Loanne Drilling ENDO. Last TSH 2.80 on levothyroxine.  Following up yearly.  Gout  Good control on allopurinol.  Lab Results  Component Value Date   LABURIC 4.8 02/01/2018   No flares in last year  Wt Readings from Last 3 Encounters:  02/07/18 262 lb 12  oz (119.2 kg)  02/01/18 262 lb (118.8 kg)  11/15/17 257 lb 4 oz (116.7 kg)     Social History /Family History/Past Medical History reviewed in detail and updated in EMR if needed. Blood pressure 130/72, pulse (!) 52, temperature (!) 97.5 F (36.4 C), temperature source Oral, height 6' 1.75" (1.873 m), weight 262 lb 12 oz (119.2 kg).  Review of Systems  Constitutional: Negative for fatigue and fever.  HENT: Negative for ear pain.   Eyes: Negative for pain.  Respiratory: Negative for cough and shortness of breath.   Cardiovascular: Negative for chest pain, palpitations and leg swelling.  Gastrointestinal: Negative for abdominal pain.  Genitourinary: Negative for dysuria.  Musculoskeletal: Negative for arthralgias.  Neurological: Negative for syncope, light-headedness and headaches.  Psychiatric/Behavioral: Negative for dysphoric mood.       Objective:   Physical Exam Constitutional:      General: He is not in acute distress.    Appearance: Normal appearance. He is well-developed. He is not ill-appearing or toxic-appearing.  HENT:     Head: Normocephalic and atraumatic.     Right Ear: Hearing, tympanic membrane, ear canal and external ear normal.     Left Ear: Hearing, tympanic membrane, ear canal and external ear normal.     Nose: Nose normal.     Mouth/Throat:     Pharynx: Uvula midline.  Eyes:     General: Lids are  normal. Lids are everted, no foreign bodies appreciated.     Conjunctiva/sclera: Conjunctivae normal.     Pupils: Pupils are equal, round, and reactive to light.  Neck:     Musculoskeletal: Normal range of motion and neck supple.     Thyroid: No thyroid mass or thyromegaly.     Vascular: No carotid bruit.     Trachea: Trachea and phonation normal.  Cardiovascular:     Rate and Rhythm: Normal rate and regular rhythm.     Pulses: Normal pulses.     Heart sounds: S1 normal and S2 normal. No murmur. No gallop.   Pulmonary:     Breath sounds: Normal breath  sounds. No wheezing, rhonchi or rales.  Abdominal:     General: Bowel sounds are normal.     Palpations: Abdomen is soft.     Tenderness: There is no abdominal tenderness. There is no guarding or rebound.     Hernia: No hernia is present.  Lymphadenopathy:     Cervical: No cervical adenopathy.  Skin:    General: Skin is warm and dry.     Findings: Rash present.     Comments:  Dry  Waxy flaky rash an nasal folds and brows  Neurological:     Mental Status: He is alert.     Cranial Nerves: No cranial nerve deficit.     Sensory: No sensory deficit.     Gait: Gait normal.     Deep Tendon Reflexes: Reflexes are normal and symmetric.  Psychiatric:        Speech: Speech normal.        Behavior: Behavior normal.        Judgment: Judgment normal.           Assessment & Plan:  The patient's preventative maintenance and recommended screening tests for an annual wellness exam were reviewed in full today. Brought up to date unless services declined.  Counselled on the importance of diet, exercise, and its role in overall health and mortality. The patient's FH and SH was reviewed, including their home life, tobacco status, and drug and alcohol status.   Vaccines:uptodate now Prostate Cancer Screen:per urology, Dr Sharin Grave Lab Results  Component Value Date   PSA 5.55 (H) 10/07/2017   PSA 4.30 (H) 10/08/2009   PSA 4.15 (H) 09/09/2009  Colon Cancer Screen:Neg cologuard 06/10/2015, plan repeat 2020      Smoking Status:former smoker > 60 pack history not candidate for CT screen as quit > 15 years ago. Hearing screen failed on 2017, 2018,2019 slight decrease in hearing

## 2018-03-07 ENCOUNTER — Other Ambulatory Visit (INDEPENDENT_AMBULATORY_CARE_PROVIDER_SITE_OTHER): Payer: Medicare Other

## 2018-03-07 ENCOUNTER — Telehealth: Payer: Self-pay | Admitting: Family Medicine

## 2018-03-07 DIAGNOSIS — E890 Postprocedural hypothyroidism: Secondary | ICD-10-CM

## 2018-03-07 LAB — T4, FREE: Free T4: 0.93 ng/dL (ref 0.60–1.60)

## 2018-03-07 LAB — T3, FREE: T3 FREE: 2.4 pg/mL (ref 2.3–4.2)

## 2018-03-07 LAB — TSH: TSH: 3.74 u[IU]/mL (ref 0.35–4.50)

## 2018-03-07 NOTE — Telephone Encounter (Signed)
-----   Message from Lendon Collar, RT sent at 03/07/2018  7:52 AM EST ----- Regarding: Lab for today 03/07/18 Please enter lab orders for this morning on this patient. Thanks!

## 2018-03-29 NOTE — Assessment & Plan Note (Signed)
Treat with topical ketoconazole.

## 2018-03-29 NOTE — Assessment & Plan Note (Signed)
Followed by Dr. Loanne Drilling.

## 2018-03-29 NOTE — Assessment & Plan Note (Signed)
Well controlled. Continue current medication. Encouraged exercise, weight loss, healthy eating habits.  

## 2018-03-29 NOTE — Assessment & Plan Note (Signed)
Good control on allopurinol.

## 2018-03-29 NOTE — Assessment & Plan Note (Signed)
LDL at goal on atorvastatin 20 mg dialy

## 2018-05-02 ENCOUNTER — Other Ambulatory Visit: Payer: Self-pay | Admitting: Family Medicine

## 2018-05-12 DIAGNOSIS — N200 Calculus of kidney: Secondary | ICD-10-CM | POA: Diagnosis not present

## 2018-05-12 DIAGNOSIS — N400 Enlarged prostate without lower urinary tract symptoms: Secondary | ICD-10-CM | POA: Diagnosis not present

## 2018-05-12 DIAGNOSIS — R82992 Hyperoxaluria: Secondary | ICD-10-CM | POA: Diagnosis not present

## 2018-05-30 ENCOUNTER — Other Ambulatory Visit: Payer: Self-pay | Admitting: Endocrinology

## 2018-05-31 ENCOUNTER — Other Ambulatory Visit: Payer: Self-pay | Admitting: Endocrinology

## 2018-06-09 ENCOUNTER — Ambulatory Visit (INDEPENDENT_AMBULATORY_CARE_PROVIDER_SITE_OTHER): Payer: Medicare Other | Admitting: Endocrinology

## 2018-06-09 ENCOUNTER — Encounter: Payer: Self-pay | Admitting: Endocrinology

## 2018-06-09 ENCOUNTER — Other Ambulatory Visit: Payer: Self-pay

## 2018-06-09 DIAGNOSIS — E042 Nontoxic multinodular goiter: Secondary | ICD-10-CM | POA: Diagnosis not present

## 2018-06-09 MED ORDER — LEVOTHYROXINE SODIUM 137 MCG PO TABS: ORAL_TABLET | ORAL | 3 refills | Status: DC

## 2018-06-09 NOTE — Progress Notes (Addendum)
Subjective:    Patient ID: Jason Solomon, male    DOB: [birth year], 78 y.o.   MRN: [id number]  HPI  Pt is here for telehealth visit, via Telephone visit.  Total time=10 minutes Alternatives to telehealth are presented to this patient, and the patient agrees to the visit. She is advised of the cost of the visit, and she agrees to this, also.   Patient is at home, and I am at the office.   Pt returns for f/u of post-RAI hypothyroidism  (he had RAI for hyperthyroidism, due to small multinodular goiter, in late 2012; he has since had a fluctuating dosage requirement for synthroid; F/U US in 2014 showed significant shrinkage of the goiter).  He denies any swelling at the anterior neck.   Past Medical History:  Diagnosis Date  . 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 DIVERTICULOSIS, COLON 01/12/2007  . 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 GERD (gastroesophageal reflux disease)   . GOITER, MULTINODULAR 05/04/2010  . GOUT 01/12/2007  . Heart murmur    childhood only  . HYPERCHOLESTEROLEMIA 06/20/2007  . HYPERTENSION 01/12/2007  . HYPERTHYROIDISM 04/24/2010  . OBESITY 01/12/2007  . PROSTATE SPECIFIC ANTIGEN, ELEVATED 10/08/2009  . Renal calculi   . Shortness of breath dyspnea    with exertion    Past Surgical History:  Procedure Laterality Date  . CARDIOVASCULAR STRESS TEST  2006   negative  . HAND SURGERY  1949   left hand laceration  . HERNIA REPAIR  1990's   right and left inguinal hernia  . KNEE SURGERY  1990's  . TONSILLECTOMY  1945    Social History   Socioeconomic History  . Marital status: Married    Spouse name: Not on file  . Number of children: 4  . Years of education: Not on file  . Highest education level: Not on file  Occupational History    Employer: RETIRED    Comment: Retired  Scientific laboratory technician  . Financial resource strain: Not on file  . Food insecurity:    Worry: Not on file    Inability: Not on file  . Transportation needs:    Medical:  Not on file    Non-medical: Not on file  Tobacco Use  . Smoking status: Former Smoker    Last attempt to quit: 02/23/1988    Years since quitting: 30.3  . Smokeless tobacco: Never Used  Substance and Sexual Activity  . Alcohol use: Yes    Alcohol/week: 7.0 standard drinks    Types: 7 Glasses of wine per week    Comment: moderate-1 glass of wine daily, occ. scotch  . Drug use: No  . Sexual activity: Never  Lifestyle  . Physical activity:    Days per week: Not on file    Minutes per session: Not on file  . Stress: Not on file  Relationships  . Social connections:    Talks on phone: Not on file    Gets together: Not on file    Attends religious service: Not on file    Active member of club or organization: Not on file    Attends meetings of clubs or organizations: Not on file    Relationship status: Not on file  . Intimate partner violence:    Fear of current or ex partner: Not on file    Emotionally abused: Not on file    Physically abused: Not on file    Forced sexual activity: Not on file  Other Topics Concern  . Not on file  Social History Narrative   Regular exercise-yes, walks , golf   Occupation: Retired Hydrographic surveyor   Diet: (+) fruit, (+) veggies, (+) salad, (+) H20   Children: 4, healthy   Married x 43 years   Living will, [place], daughter. Full code. (reviewed 2013)    Current Outpatient Medications on File Prior to Visit  Medication Sig Dispense Refill  . albuterol (PROVENTIL HFA;VENTOLIN HFA) 108 (90 Base) MCG/ACT inhaler Inhale 2 puffs into the lungs every 6 (six) hours as needed for wheezing or shortness of breath. 1 Inhaler 2  . allopurinol (ZYLOPRIM) 300 MG tablet TAKE 1 TABLET BY MOUTH  DAILY 90 tablet 1  . atorvastatin (LIPITOR) 20 MG tablet Take 1 tablet (20 mg total) by mouth daily. 90 tablet 3  . Calcium Carb-Cholecalciferol (CALCIUM 1000 + D PO) Take by mouth.    . cetirizine (ZYRTEC ALLERGY) 10 MG tablet Take 10 mg by  mouth daily.     . hydrochlorothiazide (HYDRODIURIL) 25 MG tablet TAKE 1 TABLET BY MOUTH  DAILY 90 tablet 1  . ketoconazole (NIZORAL) 2 % cream Apply 1 application topically daily. 15 g 0  . mometasone (NASONEX) 50 MCG/ACT nasal spray Place 2 sprays into the nose as needed. 17 g 0  . montelukast (SINGULAIR) 10 MG tablet Take 1 tablet (10 mg total) by mouth daily as needed. Only when flying 30 tablet 11  . polyethylene glycol (MIRALAX / GLYCOLAX) packet Take 17 g by mouth daily as needed.    . sildenafil (VIAGRA) 100 MG tablet Take 1 tablet (100 mg total) by mouth daily as needed for erectile dysfunction. 10 tablet 0  . terazosin (HYTRIN) 5 MG capsule TAKE 1 CAPSULE BY MOUTH  DAILY 90 capsule 1   No current facility-administered medications on file prior to visit.     Allergies  Allergen Reactions  . Ace Inhibitors     REACTION: ?angioedema  . Angiotensin Receptor Blockers     REACTION: angioedema on ACE?    Family History  Problem Relation Age of Onset  . Hypertension Mother   . COPD Brother   . Lung cancer Brother 20  . Throat cancer Brother   . Thyroid disease Neg Hx     There were no vitals taken for this visit.   Review of Systems Denies neck pain.      Objective:   Physical Exam   Lab Results  Component Value Date   TSH 3.74 03/07/2018       Assessment & Plan:  Hypothyroidism: well-replaced MNG: recheck PE at next in-person visit  Patient Instructions  Please continue the same medication.

## 2018-06-09 NOTE — Patient Instructions (Signed)
Please continue the same medication.

## 2018-06-16 ENCOUNTER — Other Ambulatory Visit: Payer: Self-pay | Admitting: Family Medicine

## 2018-06-16 MED ORDER — ATORVASTATIN CALCIUM 20 MG PO TABS: 20.0000 mg | ORAL_TABLET | Freq: Every day | ORAL | 3 refills | Status: DC

## 2018-06-20 MED ORDER — MONTELUKAST SODIUM 10 MG PO TABS: 10.0000 mg | ORAL_TABLET | Freq: Every day | ORAL | 11 refills | Status: DC | PRN

## 2018-07-11 NOTE — Telephone Encounter (Signed)
Please set this patient up for cologuard and let him know we have done so.

## 2018-07-12 ENCOUNTER — Telehealth: Payer: Self-pay | Admitting: Gastroenterology

## 2018-07-12 NOTE — Telephone Encounter (Signed)
Thanks Patty, He's had an incomplete colonoscopy in the past, other provider was not able to complete the exam. He had a negative cologuard 3 years ago. The question is whether or not he should be having any additional screening given his age. May be best to book him a virtual appointment with me to discuss. If he does wish to have screening at his age, Cologuard would be okay. Thanks

## 2018-07-12 NOTE — Telephone Encounter (Signed)
Left message on machine to call back  

## 2018-07-12 NOTE — Telephone Encounter (Signed)
Dr Havery Moros is cologuard ok with you?

## 2018-07-12 NOTE — Telephone Encounter (Signed)
The pt was advised of Dr Doyne Keel recommendations and was scheduled for a telehealth visit.

## 2018-07-20 DIAGNOSIS — H2511 Age-related nuclear cataract, right eye: Secondary | ICD-10-CM | POA: Diagnosis not present

## 2018-07-28 ENCOUNTER — Ambulatory Visit (INDEPENDENT_AMBULATORY_CARE_PROVIDER_SITE_OTHER): Payer: Medicare Other | Admitting: Family Medicine

## 2018-07-28 ENCOUNTER — Encounter: Payer: Self-pay | Admitting: Family Medicine

## 2018-07-28 VITALS — BP 141/85 | HR 61 | Temp 98.2°F | Ht 73.75 in | Wt 259.0 lb

## 2018-07-28 DIAGNOSIS — R0602 Shortness of breath: Secondary | ICD-10-CM | POA: Diagnosis not present

## 2018-07-28 DIAGNOSIS — R0609 Other forms of dyspnea: Secondary | ICD-10-CM | POA: Insufficient documentation

## 2018-07-28 NOTE — Progress Notes (Signed)
VIRTUAL VISIT Due to national recommendations of social distancing due to [place] 19, a virtual visit is felt to be most appropriate for this patient at this time.   I connected with the patient on 07/28/18 at  3:00 PM EDT by virtual telehealth platform and verified that I am speaking with the correct person using two identifiers.   I discussed the limitations, risks, security and privacy concerns of performing an evaluation and management service by  virtual telehealth platform and the availability of in person appointments. I also discussed with the patient that there may be a patient responsible charge related to this service. The patient expressed understanding and agreed to proceed.  Patient location: Home Provider Location: [facility], The Participants: Eliezer Lofts and Wilber Bihari   Chief Complaint  Patient presents with  . COPD    History of Present Illness: 78 year old male presents for follow up on COPD. He report continued SOB with exertion...when walking around neighborhood and uphill. Ongoing x 1-1.5 years.  He feels he has noted gradual worsening when working in yard, about 40 % worse. No cough, no wheeze, no chest pain, no excessive sweating, no increase in fatigue. Last few times he has played golf.. more issue walking up hills.  No improvement with antihistamine change and Singulair addition in 05/2018.   Spirometry 02/2017: normal  Chest CT 09/2017: Lungs/Pleura: Lungs are clear. No pleural effusion or pneumothorax. IMPRESSION: 1. No acute cardiopulmonary disease. 2.  Aortic Atherosclerosis (ICD10-I70.0).  Stress test 10/2017  Nuclear stress EF: 41%.  There was no ST segment deviation noted during stress.  Decreased tracer activity in the inferior, basal inferoseptal, basal inferolateral and apical walls consistent with soft tissue attenuation and possible scar No signficant ischemia Recommend echo to fully evaluate wall motion, LVEF  This is an  intermediate risk study.   10/2017 ECHO: nml EF, mild diastolic changes  Cbc, Tsh nml in last 6 months.   No known CAD in family  But little contact. COVID 19 screen No recent travel or known exposure to COVID19 The patient denies respiratory symptoms of COVID 19 at this time.  The importance of social distancing was discussed today.   ROS    Past Medical History:  Diagnosis Date  . 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 DIVERTICULOSIS, COLON 01/12/2007  . 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 GERD (gastroesophageal reflux disease)   . GOITER, MULTINODULAR 05/04/2010  . GOUT 01/12/2007  . Heart murmur    childhood only  . HYPERCHOLESTEROLEMIA 06/20/2007  . HYPERTENSION 01/12/2007  . HYPERTHYROIDISM 04/24/2010  . OBESITY 01/12/2007  . PROSTATE SPECIFIC ANTIGEN, ELEVATED 10/08/2009  . Renal calculi   . Shortness of breath dyspnea    with exertion    reports that he quit smoking about 30 years ago. He has never used smokeless tobacco. He reports current alcohol use of about 7.0 standard drinks of alcohol per week. He reports that he does not use drugs.   Current Outpatient Medications:  .  albuterol (PROVENTIL HFA;VENTOLIN HFA) 108 (90 Base) MCG/ACT inhaler, Inhale 2 puffs into the lungs every 6 (six) hours as needed for wheezing or shortness of breath., Disp: 1 Inhaler, Rfl: 2 .  allopurinol (ZYLOPRIM) 300 MG tablet, TAKE 1 TABLET BY MOUTH  DAILY, Disp: 90 tablet, Rfl: 1 .  atorvastatin (LIPITOR) 20 MG tablet, Take 1 tablet (20 mg total) by mouth daily., Disp: 90 tablet, Rfl: 3 .  Calcium Carb-Cholecalciferol (CALCIUM 1000 + D  PO), Take by mouth., Disp: , Rfl:  .  cetirizine (ZYRTEC ALLERGY) 10 MG tablet, Take 10 mg by mouth daily. , Disp: , Rfl:  .  hydrochlorothiazide (HYDRODIURIL) 25 MG tablet, TAKE 1 TABLET BY MOUTH  DAILY, Disp: 90 tablet, Rfl: 1 .  ketoconazole (NIZORAL) 2 % cream, Apply 1 application topically daily., Disp: 15 g, Rfl: 0 .  levothyroxine  (SYNTHROID) 137 MCG tablet, TAKE 1 TABLET BY MOUTH  DAILY BEFORE BREAKFAST, Disp: 90 tablet, Rfl: 3 .  mometasone (NASONEX) 50 MCG/ACT nasal spray, Place 2 sprays into the nose as needed., Disp: 17 g, Rfl: 0 .  montelukast (SINGULAIR) 10 MG tablet, Take 1 tablet (10 mg total) by mouth daily as needed. Only when flying, Disp: 30 tablet, Rfl: 11 .  polyethylene glycol (MIRALAX / GLYCOLAX) packet, Take 17 g by mouth daily as needed., Disp: , Rfl:  .  sildenafil (VIAGRA) 100 MG tablet, Take 1 tablet (100 mg total) by mouth daily as needed for erectile dysfunction., Disp: 10 tablet, Rfl: 0 .  terazosin (HYTRIN) 5 MG capsule, TAKE 1 CAPSULE BY MOUTH  DAILY, Disp: 90 capsule, Rfl: 1   Observations/Objective: Blood pressure (!) 141/85, pulse 61, temperature 98.2 F (36.8 C), temperature source Oral, height 6' 1.75" (1.873 m), weight 259 lb (117.5 kg).  Physical Exam  .aebtlepe Assessment and Plan SOB (shortness of breath) on exertion No improvement with antihistamine change and Singulair addition in 05/2018.   Spirometry 02/2017: normal  Chest CT 09/2017: Lungs/Pleura: Lungs are clear. No pleural effusion or pneumothorax. IMPRESSION: 1. No acute cardiopulmonary disease. 2.  Aortic Atherosclerosis (ICD10-I70.0).  Stress test 10/2017  Nuclear stress EF: 41%.  There was no ST segment deviation noted during stress.  Decreased tracer activity in the inferior, basal inferoseptal, basal inferolateral and apical walls consistent with soft tissue attenuation and possible scar No signficant ischemia Recommend echo to fully evaluate wall motion, LVEF  This is an intermediate risk study.   10/2017 ECHO: nml EF, mild diastolic changes  Cbc, Tsh nml in last 6 months.   No known CAD in family  But little contact.    Discussed in detail with pt. WIll refer to Cardiology for consideration of further cardiac work up with cath etc. If not felt to0 be cardiac in nature will consider spirometry repeat or  pulm referral.     I discussed the assessment and treatment plan with the patient. The patient was provided an opportunity to ask questions and all were answered. The patient agreed with the plan and demonstrated an understanding of the instructions.   The patient was advised to call back or seek an in-person evaluation if the symptoms worsen or if the condition fails to improve as anticipated.     Eliezer Lofts, MD

## 2018-07-28 NOTE — Assessment & Plan Note (Addendum)
No improvement with antihistamine change and Singulair addition in 05/2018.   Spirometry 02/2017: normal  Chest CT 09/2017: Lungs/Pleura: Lungs are clear. No pleural effusion or pneumothorax. IMPRESSION: 1. No acute cardiopulmonary disease. 2.  Aortic Atherosclerosis (ICD10-I70.0).  Stress test 10/2017  Nuclear stress EF: 41%.  There was no ST segment deviation noted during stress.  Decreased tracer activity in the inferior, basal inferoseptal, basal inferolateral and apical walls consistent with soft tissue attenuation and possible scar No signficant ischemia Recommend echo to fully evaluate wall motion, LVEF  This is an intermediate risk study.   10/2017 ECHO: nml EF, mild diastolic changes  Cbc, Tsh nml in last 6 months.   No known CAD in family  But little contact.    Discussed in detail with pt.  Needs EKG but appt virtual today. WIll refer to Cardiology for consideration of further cardiac work up with EKG,cath etc. If not felt to be cardiac in nature will consider spirometry repeat or pulm referral.

## 2018-08-01 ENCOUNTER — Encounter: Payer: Self-pay | Admitting: Gastroenterology

## 2018-08-01 ENCOUNTER — Ambulatory Visit (INDEPENDENT_AMBULATORY_CARE_PROVIDER_SITE_OTHER): Payer: Medicare Other | Admitting: Gastroenterology

## 2018-08-01 ENCOUNTER — Other Ambulatory Visit: Payer: Self-pay

## 2018-08-01 VITALS — Ht 74.0 in | Wt 259.0 lb

## 2018-08-01 DIAGNOSIS — R933 Abnormal findings on diagnostic imaging of other parts of digestive tract: Secondary | ICD-10-CM

## 2018-08-01 DIAGNOSIS — Z1211 Encounter for screening for malignant neoplasm of colon: Secondary | ICD-10-CM | POA: Diagnosis not present

## 2018-08-01 NOTE — Progress Notes (Signed)
THIS ENCOUNTER IS A VIRTUAL VISIT DUE TO COVID-19 - PATIENT WAS NOT SEEN IN THE OFFICE. PATIENT HAS CONSENTED TO VIRTUAL VISIT / TELEMEDICINE VISIT  USING TELEPHONE ONLY, PATIENT DID NOT HAVE VISUAL CAPABILITY   Location of patient: home Location of provider: office Persons participating: myself, patient  HPI :  78 y/o here to discuss colon cancer screening. Last seen 05/13/2015 by our office for the same issue.   His last colon cancer screening was a colonoscopy done in 2004, in which he had an incomplete colonoscopy, the procedure was aborted in the transverse colon. In reviewing this report, he had a very redundant colon with looping and the procedure could not be completed. He had a subsequent barium enema which was normal at the time.  In 2017 we discussed options, he had a Cologuard negative 06/10/2015   He denies any bowel changes. No blood in the stools. He has constipation occasionally, using Miralax PRN which works well. No abdominal pains. He has mild COPD, has had some dyspnea in the past, has had cardiac workup done in 2019. Awaiting to see cardiology.   No anemia.  He has had a cologuard at home mailed to him but has not submitted it yet.   He has no FH of colon cancer.   Colonoscopy 10/2002 - one small polyp, no path available, could not traverse the transverse colon. Follow up barium enema was normal other than diverticulosis  Past Medical History:  Diagnosis Date  . 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 COPD (chronic obstructive pulmonary disease) ([place])   . DIVERTICULOSIS, COLON 01/12/2007  . 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 GERD (gastroesophageal reflux disease)   . GOITER, MULTINODULAR 05/04/2010  . GOUT 01/12/2007  . Heart murmur    childhood only  . HYPERCHOLESTEROLEMIA 06/20/2007  . HYPERTENSION 01/12/2007  . HYPERTHYROIDISM 04/24/2010  . OBESITY 01/12/2007  . PROSTATE SPECIFIC ANTIGEN, ELEVATED 10/08/2009  . Renal calculi   . Shortness of  breath dyspnea    with exertion     Past Surgical History:  Procedure Laterality Date  . CARDIOVASCULAR STRESS TEST  2006   negative  . HAND SURGERY  1949   left hand laceration  . HERNIA REPAIR  1990's   right and left inguinal hernia  . KNEE SURGERY  1990's  . TONSILLECTOMY  1945   Family History  Problem Relation Age of Onset  . Hypertension Mother   . COPD Brother   . Lung cancer Brother 42  . Throat cancer Brother   . Hypertension Father   . Stroke Father   . Cancer Maternal Grandmother        unknown  . Thyroid disease Neg Hx    Social History   Tobacco Use  . Smoking status: Former Smoker    Last attempt to quit: 02/23/1988    Years since quitting: 30.4  . Smokeless tobacco: Never Used  Substance Use Topics  . Alcohol use: Yes    Alcohol/week: 7.0 standard drinks    Types: 7 Glasses of wine per week    Comment: moderate-1 glass of wine daily, occ. scotch  . Drug use: No   Current Outpatient Medications  Medication Sig Dispense Refill  . albuterol (PROVENTIL HFA;VENTOLIN HFA) 108 (90 Base) MCG/ACT inhaler Inhale 2 puffs into the lungs every 6 (six) hours as needed for wheezing or shortness of breath. 1 Inhaler 2  . allopurinol (ZYLOPRIM) 300 MG tablet TAKE 1 TABLET BY MOUTH  DAILY  90 tablet 1  . aspirin 81 MG EC tablet Take by mouth daily.    Marland Kitchen atorvastatin (LIPITOR) 20 MG tablet Take 1 tablet (20 mg total) by mouth daily. 90 tablet 3  . Calcium Carb-Cholecalciferol (CALCIUM 1000 + D PO) Take by mouth.    . hydrochlorothiazide (HYDRODIURIL) 25 MG tablet TAKE 1 TABLET BY MOUTH  DAILY 90 tablet 1  . levothyroxine (SYNTHROID) 137 MCG tablet TAKE 1 TABLET BY MOUTH  DAILY BEFORE BREAKFAST 90 tablet 3  . mometasone (NASONEX) 50 MCG/ACT nasal spray Place 2 sprays into the nose as needed. 17 g 0  . montelukast (SINGULAIR) 10 MG tablet Take 1 tablet (10 mg total) by mouth daily as needed. Only when flying 30 tablet 11  . polyethylene glycol (MIRALAX / GLYCOLAX) packet  Take 17 g by mouth daily as needed.    . sildenafil (VIAGRA) 100 MG tablet Take 1 tablet (100 mg total) by mouth daily as needed for erectile dysfunction. 10 tablet 0  . terazosin (HYTRIN) 5 MG capsule TAKE 1 CAPSULE BY MOUTH  DAILY 90 capsule 1   No current facility-administered medications for this visit.    Allergies  Allergen Reactions  . Ace Inhibitors     REACTION: ?angioedema  . Angiotensin Receptor Blockers     REACTION: angioedema on ACE?     Review of Systems: All systems reviewed and negative except where noted in HPI.   Lab Results  Component Value Date   WBC 3.8 (L) 10/07/2017   HGB 13.9 10/07/2017   HCT 42.5 10/07/2017   MCV 92.6 10/07/2017   PLT 181.0 10/07/2017    Lab Results  Component Value Date   CREATININE 1.08 02/01/2018   BUN 18 02/01/2018   NA 140 02/01/2018   K 3.6 02/01/2018   CL 102 02/01/2018   CO2 33 (H) 02/01/2018    Lab Results  Component Value Date   ALT 28 02/01/2018   AST 27 02/01/2018   ALKPHOS 43 02/01/2018   BILITOT 1.1 02/01/2018     Physical Exam: Ht 6\' 2"  (1.88 m) Comment: pt provided over the phone  Wt 259 lb (117.5 kg) Comment: pt provided over the phone  BMI 33.25 kg/m  NA  ASSESSMENT AND PLAN: 78 y/o male here for assessment of the following issues:  Colon cancer screening / failed optical colonoscopy - negative cologuard in 2017, we discussed options for screening. He had a small polyp removed in 2004 but no path available, has not been able to have a successful colonoscopy in the past. Given his age and lack of symptoms, we could stop colon cancer screening now that he is over age 79, that is reasonable and appropriate. He reported he already had a Cologuard at home that was previously ordered by another provider, his preference is to submit that prior to stopping screening. He understands that a positive Cologuard will result in a recommendation to have a colonoscopy, which has not been possible to complete for him  in the past. We discussed this issue for a bit, he did not want to stop screening and plans on pursuing Cologuard. If this is negative, I would not recommend any further stool testing. If it is positive, he will need to schedule colonoscopy with me, understands that it may not be successful given his anatomy. All questions answered.  Huntsdale Cellar, MD [facility]

## 2018-08-22 ENCOUNTER — Telehealth: Payer: Self-pay

## 2018-08-22 NOTE — Telephone Encounter (Signed)
    COVID-19 Pre-Screening Questions:  . In the past 7 to 10 days have you had a cough, shortness of breath, headache, congestion, fever (100 or greater), body aches, chills, sore throat, or sudden loss of taste or sense of smell? NO . Have you been around anyone with known Covid 19? NO . Have you been around anyone who is awaiting Covid 19 test results in the past 7 to 10 days? NO . Have you been around anyone who has been exposed to Covid 19, or has mentioned symptoms of Covid 19 within the past 7 to 10 days? NO  If you have any concerns/questions about symptoms patients report during screening (either on the phone or at threshold). Contact the provider seeing the patient or DOD for further guidance.  If neither are available contact a member of the leadership team.            

## 2018-08-22 NOTE — Progress Notes (Signed)
New Outpatient Visit Date: 08/23/2018  Referring Provider: Jinny Sanders, MD [address] [place],  [place] [address]  Chief Complaint: Shortness of breath  HPI:  Jason Solomon is a 78 y.o. male who is being seen today for the evaluation of shortness of breath at the request of Dr. Diona Browner. He has a history of COPD, hypertension, hyperlipidemia, GERD, obesity, and angioedema (suspected to be due to ACE inhibitor).  Myocardial perfusion stress test in 10/2017 showed possible inferior scar versus artifact with LVEF 41%.  Echo showed normal LVEF without wall motion abnormality.  Chest CT without contrast in 09/2017 was notable for coronary artery and aortic atherosclerotic calcification.  Today, Jason Solomon reports that he has had exertional dyspnea for about a year.  He is able to walk on level ground without any difficulty but becomes more winded than usual when he climbs stairs or walks up an incline.  He feels like it has gotten gradually worse over the last several months.  He initially thought that weight gain may have contributed to this, though his dyspnea has persisted despite trying to lose weight.  He denies chest pain, lightheadedness, orthopnea, and PND.  He wonders if he has occasional abdominal swelling.  He also has mild leg edema.  He has rare palpitations lasting a second or 2 without associated symptoms.  --------------------------------------------------------------------------------------------------  Cardiovascular History & Procedures: Cardiovascular Problems:  Shortness of breath  Risk Factors:  Coronary artery calcification  Cath/PCI:  None  CV Surgery:  None  EP Procedures and Devices:  None  Non-Invasive Evaluation(s):  TTE (11/18/2017): Normal LV size with mild LVH.  LVEF 55-60% with normal wall motion and grade 1 diastolic dysfunction.  Mild MR.  Mild biatrial enlargement.  Normal RV size and function.  Normal PA pressure.  Pharmacologic myocardial  perfusion stress test (10/31/2017): Intermediate risk study with fixed defect involving the inferior, inferolateral, and apical segments thought to be artifact versus scar.  No ischemia was seen.  LVEF 41%.  Exercise stress echo (03/19/2015): Normal study without ischemia.  Frequent PAC's and PVC's noted at rest and during stress.  Recent CV Pertinent Labs: Lab Results  Component Value Date   CHOL 114 02/01/2018   HDL 55.20 02/01/2018   LDLCALC 44 02/01/2018   TRIG 75.0 02/01/2018   CHOLHDL 2 02/01/2018   K 3.6 02/01/2018   MG 2.0 01/16/2014   BUN 18 02/01/2018   CREATININE 1.08 02/01/2018    --------------------------------------------------------------------------------------------------  Past Medical History:  Diagnosis Date  . 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 COPD (chronic obstructive pulmonary disease) ([place])   . DIVERTICULOSIS, COLON 01/12/2007  . 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 GERD (gastroesophageal reflux disease)   . GOITER, MULTINODULAR 05/04/2010  . GOUT 01/12/2007  . Heart murmur    childhood only  . HYPERCHOLESTEROLEMIA 06/20/2007  . HYPERTENSION 01/12/2007  . HYPERTHYROIDISM 04/24/2010  . OBESITY 01/12/2007  . PROSTATE SPECIFIC ANTIGEN, ELEVATED 10/08/2009  . Renal calculi   . Shortness of breath dyspnea    with exertion    Past Surgical History:  Procedure Laterality Date  . CARDIOVASCULAR STRESS TEST  2006   negative  . HAND SURGERY  1949   left hand laceration  . HERNIA REPAIR  1990's   right and left inguinal hernia  . KNEE SURGERY  1990's  . TONSILLECTOMY  1945    Current Meds  Medication Sig  . albuterol (PROVENTIL HFA;VENTOLIN HFA) 108 (90 Base) MCG/ACT inhaler Inhale  2 puffs into the lungs every 6 (six) hours as needed for wheezing or shortness of breath.  . allopurinol (ZYLOPRIM) 300 MG tablet TAKE 1 TABLET BY MOUTH  DAILY  . aspirin 81 MG EC tablet Take by mouth daily.  Marland Kitchen atorvastatin (LIPITOR) 20 MG tablet Take 1  tablet (20 mg total) by mouth daily.  . Calcium Carb-Cholecalciferol (CALCIUM 1000 + D PO) Take by mouth.  . hydrochlorothiazide (HYDRODIURIL) 25 MG tablet TAKE 1 TABLET BY MOUTH  DAILY  . levothyroxine (SYNTHROID) 137 MCG tablet TAKE 1 TABLET BY MOUTH  DAILY BEFORE BREAKFAST  . mometasone (NASONEX) 50 MCG/ACT nasal spray Place 2 sprays into the nose as needed.  . montelukast (SINGULAIR) 10 MG tablet Take 1 tablet (10 mg total) by mouth daily as needed. Only when flying  . polyethylene glycol (MIRALAX / GLYCOLAX) packet Take 17 g by mouth daily as needed.  . sildenafil (VIAGRA) 100 MG tablet Take 1 tablet (100 mg total) by mouth daily as needed for erectile dysfunction.  Marland Kitchen terazosin (HYTRIN) 5 MG capsule TAKE 1 CAPSULE BY MOUTH  DAILY    Allergies: Ace inhibitors and Angiotensin receptor blockers  Social History   Tobacco Use  . Smoking status: Former Smoker    Packs/day: 2.00    Years: 30.00    Pack years: 60.00    Quit date: 1990    Years since quitting: 30.5  . Smokeless tobacco: Never Used  Substance Use Topics  . Alcohol use: Yes    Alcohol/week: 5.0 standard drinks    Types: 5 Glasses of wine per week  . Drug use: No    Family History  Problem Relation Age of Onset  . Hypertension Mother   . COPD Brother   . Lung cancer Brother 17  . Throat cancer Brother   . Hypertension Father   . Stroke Father   . Cancer Maternal Grandmother        unknown  . Thyroid disease Neg Hx     Review of Systems: A 12-system review of systems was performed and was negative except as noted in the HPI.  --------------------------------------------------------------------------------------------------  Physical Exam: BP 134/84 (BP Location: Left Arm, Patient Position: Sitting, Cuff Size: Normal)   Pulse (!) 59   Ht 6\' 1"  (1.854 m)   Wt 256 lb (116.1 kg)   BMI 33.78 kg/m   General: NAD. HEENT: No conjunctival pallor or scleral icterus.  Face mask in place Neck: Supple without  lymphadenopathy, thyromegaly, JVD, or HJR. No carotid bruit. Lungs: Normal work of breathing. Clear to auscultation bilaterally without wheezes or crackles. Heart: Regular rate and rhythm without murmurs, rubs, or gallops. Non-displaced PMI. Abd: Bowel sounds present. Soft, NT/ND without hepatosplenomegaly Ext: 1+ pretibial edema bilaterally. Radial, PT, and DP pulses are 2+ bilaterally Skin: Warm and dry without rash. Neuro: CNIII-XII intact. Strength and fine-touch sensation intact in upper and lower extremities bilaterally. Psych: Normal mood and affect.  EKG: Sinus bradycardia with first-degree AV block and occasional PVCs.  Left axis deviation.  Lab Results  Component Value Date   WBC 3.8 (L) 10/07/2017   HGB 13.9 10/07/2017   HCT 42.5 10/07/2017   MCV 92.6 10/07/2017   PLT 181.0 10/07/2017    Lab Results  Component Value Date   NA 140 02/01/2018   K 3.6 02/01/2018   CL 102 02/01/2018   CO2 33 (H) 02/01/2018   BUN 18 02/01/2018   CREATININE 1.08 02/01/2018   GLUCOSE 105 (H) 02/01/2018   ALT  28 02/01/2018    Lab Results  Component Value Date   CHOL 114 02/01/2018   HDL 55.20 02/01/2018   LDLCALC 44 02/01/2018   TRIG 75.0 02/01/2018   CHOLHDL 2 02/01/2018     --------------------------------------------------------------------------------------------------  ASSESSMENT AND PLAN: Shortness of breath: Etiology of chronic shortness of breath over the last year remains unclear.  This very well could be multifactorial, including obesity, history of tobacco use, and underlying cardiac disease.  Notably, spirometry last year was normal.  Echocardiogram showed preserved LVEF with grade 1 diastolic dysfunction.  Myocardial perfusion stress test was intermediate risk with possible scar/artifact but no ischemia.  Nonetheless, coronary artery calcification and aortic atherosclerosis have been noted on prior CT of the chest.  We discussed empiric medical therapy versus cardiac CTA  and cardiac catheterization and have agreed to cardiac CTA to exclude obstructive coronary disease.  In the meantime, I have recommended switching hydrochlorothiazide to furosemide 40 mg daily to see if more aggressive diuresis will improve Jason Solomon symptoms.  If CTA is unrevealing and diuresis does not help, repeat full PFTs may be helpful.  Hypertension: Blood pressure borderline elevated today.  As above, I will discontinue HCTZ and start furosemide 40 mg daily.  I have asked Jason Solomon to monitor his blood pressure and let us know if it is consistently above 140/90.  We will recheck a BMP in ~2 weeks.  Aortic atherosclerosis: Noted on prior chest CT.  Continue aspirin and atorvastatin; LDL at goal.  Follow-up: Return to clinic in 6 weeks.  Nelva Bush, MD 08/23/2018 4:45 PM

## 2018-08-23 ENCOUNTER — Other Ambulatory Visit: Payer: Self-pay

## 2018-08-23 ENCOUNTER — Ambulatory Visit (INDEPENDENT_AMBULATORY_CARE_PROVIDER_SITE_OTHER): Payer: Medicare Other | Admitting: Internal Medicine

## 2018-08-23 ENCOUNTER — Encounter: Payer: Self-pay | Admitting: Internal Medicine

## 2018-08-23 VITALS — BP 134/84 | HR 59 | Ht 73.0 in | Wt 256.0 lb

## 2018-08-23 DIAGNOSIS — I5030 Unspecified diastolic (congestive) heart failure: Secondary | ICD-10-CM

## 2018-08-23 DIAGNOSIS — I10 Essential (primary) hypertension: Secondary | ICD-10-CM | POA: Diagnosis not present

## 2018-08-23 DIAGNOSIS — I110 Hypertensive heart disease with heart failure: Secondary | ICD-10-CM

## 2018-08-23 DIAGNOSIS — I700 Atherosclerosis of aorta: Secondary | ICD-10-CM | POA: Diagnosis not present

## 2018-08-23 DIAGNOSIS — R0602 Shortness of breath: Secondary | ICD-10-CM

## 2018-08-23 MED ORDER — FUROSEMIDE 40 MG PO TABS: 40.0000 mg | ORAL_TABLET | Freq: Every day | ORAL | 3 refills | Status: DC

## 2018-08-23 NOTE — Patient Instructions (Addendum)
Medication Instructions:  Your physician has recommended you make the following change in your medication:  1- STOP HCTZ 2- START Lasix 1 tablet (40 mg total) once daily.  If you need a refill on your cardiac medications before your next appointment, please call your pharmacy.   Lab work: Your physician recommends that you return for lab work in: [facility] weeks [facility] the [facility] mall. ([facility])  No appt is needed. Hours are M-F 7AM- 6 PM.  If you have labs (blood work) drawn today and your tests are completely normal, you will receive your results only by: Marland Kitchen MyChart Message (if you have MyChart) OR . A paper copy in the mail If you have any lab test that is abnormal or we need to change your treatment, we will call you to review the results.  Testing/Procedures: 1- Please arrive at _________. You will be called for scheduling with location, date and time.  Please follow these instructions carefully (unless otherwise directed):  Hold all erectile dysfunction medications at least 48 hours prior to test.  On the Night Before the Test: . Be sure to Drink plenty of water. . Do not consume any caffeinated/decaffeinated beverages or chocolate 12 hours prior to your test. . Do not take any antihistamines 12 hours prior to your test.  On the Day of the Test: . Drink plenty of water. Do not drink any water within one hour of the test. . Do not eat any food 4 hours prior to the test. . You may take your regular medications prior to the test.  . HOLD Sildenafil . HOLD Furosemide/Hydrochlorothiazide morning of the test.            After the Test: . Drink plenty of water. . After receiving IV contrast, you may experience a mild flushed feeling. This is normal. . On occasion, you may experience a mild rash up to 24 hours after the test. This is not dangerous. If this occurs, you can take Benadryl 25 mg and increase your fluid intake. . If you experience trouble breathing, this can be serious. If it is  severe call 911 IMMEDIATELY. If it is mild, please call our office. . If you take any of these medications: Glipizide/Metformin, Avandament, Glucavance, please do not take 48 hours after completing test.   Follow-Up: At [facility], you and your health needs are our priority.  As part of our continuing mission to provide you with exceptional heart care, we have created designated Provider Care Teams.  These Care Teams include your primary Cardiologist (physician) and Advanced Practice Providers (APPs -  Physician Assistants and Nurse Practitioners) who all work together to provide you with the care you need, when you need it. You will need a follow up appointment in 6 weeks.  You may see Dr. Saunders Revel or Christell Faith, PA-C.

## 2018-08-24 ENCOUNTER — Encounter: Payer: Self-pay | Admitting: Internal Medicine

## 2018-08-24 DIAGNOSIS — L531 Erythema annulare centrifugum: Secondary | ICD-10-CM | POA: Diagnosis not present

## 2018-08-24 DIAGNOSIS — I700 Atherosclerosis of aorta: Secondary | ICD-10-CM | POA: Insufficient documentation

## 2018-08-30 ENCOUNTER — Telehealth (HOSPITAL_COMMUNITY): Payer: Self-pay | Admitting: *Deleted

## 2018-08-30 NOTE — Addendum Note (Signed)
Addended by: Verlon Au on: 08/30/2018 01:47 PM   Modules accepted: Orders

## 2018-08-31 ENCOUNTER — Other Ambulatory Visit: Payer: Self-pay

## 2018-08-31 ENCOUNTER — Other Ambulatory Visit
Admission: RE | Admit: 2018-08-31 | Discharge: 2018-08-31 | Disposition: A | Discharge status: Home / Self Care | Payer: Medicare Other | Attending: Internal Medicine | Admitting: Internal Medicine

## 2018-08-31 DIAGNOSIS — R0602 Shortness of breath: Secondary | ICD-10-CM | POA: Insufficient documentation

## 2018-08-31 LAB — BASIC METABOLIC PANEL
Anion gap: 9 (ref 5–15)
BUN: 23 mg/dL (ref 8–23)
CO2: 29 mmol/L (ref 22–32)
Calcium: 9.4 mg/dL (ref 8.9–10.3)
Chloride: 103 mmol/L (ref 98–111)
Creatinine, Ser: 1.19 mg/dL (ref 0.61–1.24)
GFR calc Af Amer: >60 mL/min (ref >60)
GFR calc non Af Amer: 59 mL/min — ABNORMAL LOW (ref >60)
Glucose, Bld: 106 mg/dL — ABNORMAL HIGH (ref 70–99)
Potassium: 3.5 mmol/L (ref 3.5–5.1)
Sodium: 141 mmol/L (ref 135–145)

## 2018-08-31 NOTE — Telephone Encounter (Signed)
Spoke with patients wife regarding CT heart.

## 2018-09-01 ENCOUNTER — Ambulatory Visit
Admission: RE | Admit: 2018-09-01 | Discharge: 2018-09-01 | Disposition: A | Discharge status: Home / Self Care | Payer: Medicare Other | Source: Ambulatory Visit | Attending: Internal Medicine | Admitting: Internal Medicine

## 2018-09-01 DIAGNOSIS — I5030 Unspecified diastolic (congestive) heart failure: Secondary | ICD-10-CM | POA: Diagnosis not present

## 2018-09-01 DIAGNOSIS — I110 Hypertensive heart disease with heart failure: Secondary | ICD-10-CM

## 2018-09-01 DIAGNOSIS — R0602 Shortness of breath: Secondary | ICD-10-CM | POA: Diagnosis not present

## 2018-09-01 DIAGNOSIS — I700 Atherosclerosis of aorta: Secondary | ICD-10-CM

## 2018-09-01 MED ORDER — NITROGLYCERIN 0.4 MG SL SUBL
0.8000 mg | SUBLINGUAL_TABLET | Freq: Once | SUBLINGUAL | Status: AC
  Administered 2018-09-01: 0.8 mg via SUBLINGUAL

## 2018-09-01 MED ORDER — IOHEXOL 350 MG/ML SOLN
100.0000 mL | Freq: Once | INTRAVENOUS | Status: AC | PRN
  Administered 2018-09-01: 100 mL via INTRAVENOUS

## 2018-09-01 NOTE — Progress Notes (Signed)
Patient tolerated CT without incident. Gave patient water and crackers. Ambulated to exit steady gait.

## 2018-09-04 DIAGNOSIS — I5030 Unspecified diastolic (congestive) heart failure: Secondary | ICD-10-CM | POA: Diagnosis not present

## 2018-09-04 DIAGNOSIS — I700 Atherosclerosis of aorta: Secondary | ICD-10-CM | POA: Diagnosis not present

## 2018-09-04 DIAGNOSIS — I110 Hypertensive heart disease with heart failure: Secondary | ICD-10-CM | POA: Diagnosis not present

## 2018-09-05 ENCOUNTER — Telehealth: Payer: Self-pay

## 2018-09-05 NOTE — Telephone Encounter (Signed)
-----   Message from Nelva Bush, MD sent at 09/05/2018  6:47 AM EDT ----- Please let Jason Solomon know that his cardiac CTA shows moderate narrowing of his coronary arteries but no severe blockage to explain his shortness of breath.  His pulmonary artery was noted to be enlarged, which can be seen with pulmonary hypertension or other underlying lung disease.  I recommend that he continue his current medications.  We should repeat an echocardiogram at his convenience with special attention paid to pulmonary artery pressures.  I will forward this to Dr. Diona Browner for her review as well.

## 2018-09-05 NOTE — Telephone Encounter (Signed)
Pt made aware of his cardiac CT results with verbal understanding. Pt will f/u with Dr.End in Aug as planned. Pt would like to have a copy of the images on CD. Provided the pt the mail tel number for [facility]. Adv the pt to ask for medical records dept. Pt voiced appreciation for the assistance.

## 2018-10-02 NOTE — Progress Notes (Signed)
Follow-up Outpatient Visit Date: 10/04/2018  Primary Care Provider: Jinny Sanders, MD [place] [address]  Chief Complaint: Shortness of breath  HPI:  Jason Solomon is a 78 y.o. 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  I met him on 08/23/2018, at which time Jason Solomon complained of exertional dyspnea that had been present for ~1 year (most often when walking uphil or on stairs).  Echo in 10/2017 showed normal LVEF with grade 1 diastolic dysfunction and mild MR.  Cardiac CTA, as noted above, was performed after our recent visit and showed moderate, non-obstructive multivessel CAD.  Today, Jason Solomon reports feeling about the same, with stable exertional dyspnea.  He denies chest pain, palpitations, and lightheadedness.  Lower extremity edema has not changed after transitioning from HCTZ -> furosemide.  He is taking this at night and reports nocturia x 5 most nights.  --------------------------------------------------------------------------------------------------  Cardiovascular History & Procedures: Cardiovascular Problems:  Shortness of breath  Risk Factors:  Coronary artery calcification  Cath/PCI:  None  CV Surgery:  None  EP Procedures and Devices:  None  Non-Invasive Evaluation(s):  Cardiac CTA (09/01/2018): LMCA normal.  LAD with 50-69% proximal and mid vessel disease.  LCx with 60-69% stenosis in midvessel.  Dominant RCA with diffuse 25-49% stenosis in the proximal and mid vessel.  Distal RCA with 50-69% calcified plaque.  CTFFR is not significant in any vessel.  Coronary calcium score 1,093 Agatston units (77 percentile for age/sex).  Aortic atherosclerosis noted.  TTE (11/18/2017): Normal LV size with mild LVH.  LVEF 55-60% with normal wall motion and  grade 1 diastolic dysfunction.  Mild MR.  Mild biatrial enlargement.  Normal RV size and function.  Normal PA pressure.  Pharmacologic myocardial perfusion stress test (10/31/2017): Intermediate risk study with fixed defect involving the inferior, inferolateral, and apical segments thought to be artifact versus scar.  No ischemia was seen.  LVEF 41%.  Exercise stress echo (03/19/2015): Normal study without ischemia.  Frequent PAC's and PVC's noted at rest and during stress.  Recent CV Pertinent Labs: Lab Results  Component Value Date   CHOL 114 02/01/2018   HDL 55.20 02/01/2018   LDLCALC 44 02/01/2018   TRIG 75.0 02/01/2018   CHOLHDL 2 02/01/2018   K 3.5 08/31/2018   MG 2.0 01/16/2014   BUN 23 08/31/2018   CREATININE 1.19 08/31/2018    Past medical and surgical history were reviewed and updated in EPIC.  Current Meds  Medication Sig  . albuterol (PROVENTIL HFA;VENTOLIN HFA) 108 (90 Base) MCG/ACT inhaler Inhale 2 puffs into the lungs every 6 (six) hours as needed for wheezing or shortness of breath.  . allopurinol (ZYLOPRIM) 300 MG tablet TAKE 1 TABLET BY MOUTH  DAILY  . aspirin 81 MG EC tablet Take by mouth daily.  Marland Kitchen atorvastatin (LIPITOR) 20 MG tablet Take 1 tablet (20 mg total) by mouth daily.  . Calcium Carb-Cholecalciferol (CALCIUM 1000 + D PO) Take by mouth.  . furosemide (LASIX) 40 MG tablet Take 1 tablet (40 mg total) by mouth daily.  Marland Kitchen levothyroxine (SYNTHROID) 137 MCG tablet TAKE 1 TABLET BY MOUTH  DAILY BEFORE BREAKFAST  . mometasone (NASONEX) 50 MCG/ACT nasal spray Place 2 sprays into the nose as needed.  . montelukast (SINGULAIR) 10 MG tablet Take 1 tablet (10 mg total) by mouth  daily as needed. Only when flying  . polyethylene glycol (MIRALAX / GLYCOLAX) packet Take 17 g by mouth daily as needed.  . sildenafil (VIAGRA) 100 MG tablet Take 1 tablet (100 mg total) by mouth daily as needed for erectile dysfunction.  Marland Kitchen terazosin (HYTRIN) 5 MG capsule TAKE 1 CAPSULE BY MOUTH   DAILY    Allergies: Ace inhibitors and Angiotensin receptor blockers  Social History   Tobacco Use  . Smoking status: Former Smoker    Packs/day: 2.00    Years: 30.00    Pack years: 60.00    Quit date: 1990    Years since quitting: 30.6  . Smokeless tobacco: Never Used  Substance Use Topics  . Alcohol use: Yes    Alcohol/week: 5.0 standard drinks    Types: 5 Glasses of wine per week  . Drug use: No    Family History  Problem Relation Age of Onset  . Hypertension Mother   . COPD Brother   . Lung cancer Brother 69  . Throat cancer Brother   . Hypertension Father   . Stroke Father   . Cancer Maternal Grandmother        unknown  . Thyroid disease Neg Hx     Review of Systems: A 12-system review of systems was performed and was negative except as noted in the HPI.  --------------------------------------------------------------------------------------------------  Physical Exam: BP 138/80 (BP Location: Left Arm, Patient Position: Sitting, Cuff Size: Normal)   Pulse (!) 50   Ht '6\' 2"'$  (1.88 m)   Wt 256 lb 12 oz (116.5 kg)   BMI 32.96 kg/m   General:  NAD HEENT: No conjunctival pallor or scleral icterus. Facemask in place Neck: Supple without lymphadenopathy, thyromegaly, JVD, or HJR. No carotid bruit. Lungs: Normal work of breathing. Clear to auscultation bilaterally without wheezes or crackles. Heart: Bradycardic but regular without murmurs, rubs, or gallops. Abd: Bowel sounds present. Soft, NT/ND without hepatosplenomegaly Ext: No lower extremity edema. Radial, PT, and DP pulses are 2+ bilaterally. Skin: Warm and dry without rash.  EKG:  Sinus bradycardia with 1st degree AV block and occasional PVC's.  Otherwise, no significant abnormality.  Lab Results  Component Value Date   WBC 3.8 (L) 10/07/2017   HGB 13.9 10/07/2017   HCT 42.5 10/07/2017   MCV 92.6 10/07/2017   PLT 181.0 10/07/2017    Lab Results  Component Value Date   NA 141 08/31/2018   K 3.5  08/31/2018   CL 103 08/31/2018   CO2 29 08/31/2018   BUN 23 08/31/2018   CREATININE 1.19 08/31/2018   GLUCOSE 106 (H) 08/31/2018   ALT 28 02/01/2018    Lab Results  Component Value Date   CHOL 114 02/01/2018   HDL 55.20 02/01/2018   LDLCALC 44 02/01/2018   TRIG 75.0 02/01/2018   CHOLHDL 2 02/01/2018    --------------------------------------------------------------------------------------------------  ASSESSMENT AND PLAN: Shortness of breath: Likely multifactorial.  Recent cardiac CTA showed moderate multivessel CAD that was not hemodynamically significant by CTFFR.  Microvascular dysfunction is a possibility; we discussed empiric trial of antianginal therapy but have agreed to defer this in favor of pulmonary consultation.  Coronary artery disease: Moderate, non-obstructive CAD noted.  Recommend continuation of aspirin and statin therpay.  LDL at goal.  Hypertension: BP upper normal today.  Continue furosemide in lieu of HCTZ.  Lower extremity edema: Likely a combination of venous insufficiency and possible component of HFpEF.  Underlying OSA cannot be excluded.  We have agreed to continue current dose  of furosemide and proceed with pulmonary evaluation.  I encouraged sodium restriction and use of OTC compression stockings.  Follow-up: Return to clinic in 4 months.  Nelva Bush, MD 10/04/2018 9:47 AM

## 2018-10-04 ENCOUNTER — Encounter: Payer: Self-pay | Admitting: Internal Medicine

## 2018-10-04 ENCOUNTER — Other Ambulatory Visit: Payer: Self-pay

## 2018-10-04 ENCOUNTER — Ambulatory Visit (INDEPENDENT_AMBULATORY_CARE_PROVIDER_SITE_OTHER): Payer: Medicare Other | Admitting: Internal Medicine

## 2018-10-04 VITALS — BP 138/80 | HR 50 | Ht 74.0 in | Wt 256.8 lb

## 2018-10-04 DIAGNOSIS — I10 Essential (primary) hypertension: Secondary | ICD-10-CM

## 2018-10-04 DIAGNOSIS — R600 Localized edema: Secondary | ICD-10-CM | POA: Insufficient documentation

## 2018-10-04 DIAGNOSIS — R0602 Shortness of breath: Secondary | ICD-10-CM | POA: Diagnosis not present

## 2018-10-04 DIAGNOSIS — I2510 Atherosclerotic heart disease of native coronary artery without angina pectoris: Secondary | ICD-10-CM

## 2018-10-04 NOTE — Patient Instructions (Signed)
Medication Instructions:  Your physician recommends that you continue on your current medications as directed. Please refer to the Current Medication list given to you today.  Dr End recommends you take your furosemide in the morning part of the day.  If you need a refill on your cardiac medications before your next appointment, please call your pharmacy.   Lab work: - None ordered.  If you have labs (blood work) drawn today and your tests are completely normal, you will receive your results only by: Marland Kitchen MyChart Message (if you have MyChart) OR . A paper copy in the mail If you have any lab test that is abnormal or we need to change your treatment, we will call you to review the results.  Testing/Procedures: - None ordered.   Follow-Up: You have been referred to Pulmonology for evaluation of shortness of breath.   At [facility], you and your health needs are our priority.  As part of our continuing mission to provide you with exceptional heart care, we have created designated Provider Care Teams.  These Care Teams include your primary Cardiologist (physician) and Advanced Practice Providers (APPs -  Physician Assistants and Nurse Practitioners) who all work together to provide you with the care you need, when you need it. You will need a follow up appointment in 4 months.  Please call our office 2 months in advance to schedule this appointment.  You may see DR Harrell Gave END or one of the following Advanced Practice Providers on your designated Care Team:   Murray Hodgkins, NP Christell Faith, PA-C . Marrianne Mood, PA-C

## 2018-10-05 DIAGNOSIS — L531 Erythema annulare centrifugum: Secondary | ICD-10-CM | POA: Diagnosis not present

## 2018-10-11 ENCOUNTER — Other Ambulatory Visit: Payer: Self-pay

## 2018-10-11 ENCOUNTER — Ambulatory Visit (INDEPENDENT_AMBULATORY_CARE_PROVIDER_SITE_OTHER): Payer: Medicare Other | Admitting: Pulmonary Disease

## 2018-10-11 ENCOUNTER — Encounter: Payer: Self-pay | Admitting: Pulmonary Disease

## 2018-10-11 VITALS — BP 136/78 | HR 51 | Temp 97.4°F | Ht 74.0 in | Wt 260.4 lb

## 2018-10-11 DIAGNOSIS — J449 Chronic obstructive pulmonary disease, unspecified: Secondary | ICD-10-CM | POA: Diagnosis not present

## 2018-10-11 DIAGNOSIS — E669 Obesity, unspecified: Secondary | ICD-10-CM

## 2018-10-11 DIAGNOSIS — I519 Heart disease, unspecified: Secondary | ICD-10-CM | POA: Diagnosis not present

## 2018-10-11 DIAGNOSIS — R0683 Snoring: Secondary | ICD-10-CM

## 2018-10-11 DIAGNOSIS — R0602 Shortness of breath: Secondary | ICD-10-CM | POA: Diagnosis not present

## 2018-10-11 DIAGNOSIS — E66811 Obesity, class 1: Secondary | ICD-10-CM

## 2018-10-11 DIAGNOSIS — I5189 Other ill-defined heart diseases: Secondary | ICD-10-CM

## 2018-10-11 MED ORDER — ANORO ELLIPTA 62.5-25 MCG/INH IN AEPB: 1.0000 | INHALATION_SPRAY | Freq: Every day | RESPIRATORY_TRACT | 0 refills | Status: AC

## 2018-10-11 MED ORDER — ANORO ELLIPTA 62.5-25 MCG/INH IN AEPB: 1.0000 | INHALATION_SPRAY | Freq: Every morning | RESPIRATORY_TRACT | 3 refills | Status: DC

## 2018-10-11 NOTE — Progress Notes (Addendum)
Subjective:    Patient ID: Jason Solomon, male    DOB: 12/11/1940, 78 y.o.   MRN: [id number]  HPI Patient is a 78 year old former smoker (quit 1990) who presents for evaluation of dyspnea on exertion which he states started approximately 2 years ago.  Patient is kindly referred by Dr. Eliezer Lofts.  The patient notes that over the last year he has become somewhat worse.  The patient states that he has been diagnosed with "mild COPD" has not had any issues with exacerbations.  He has not ever had to be hospitalized for COPD.  He has been evaluated by cardiology, 2D echo performed on 18 November 2017 showed evidence of diastolic dysfunction, he had a stress test Carlton Adam) on 31 October 2017 that was noted to be an intermediate risk study.  He had cardiac CTA performed and this showed moderate nonobstructive multivessel coronary artery disease.  Patient has noticed that his chores around the garden have become more difficult and more exhausting.  He has not had any chest pain.  No wheezing noted.  He has not had any fevers, chills or sweats.  No palpitations.  He has had lower extremity edema and has had his HCTZ switched to furosemide lately by cardiology.  With regards to sleep habits he states that he gets up approximately 4 times in the night to void but this was due to taking his diuretic in the evening he has corrected this by taking his diuretic in the morning.  He states he feels refreshed when he gets up in the morning.  He usually sleeps anywhere between 5 to 6 hours and wakes up without having to use an alarm clock.  Though he snores he states that his wife has never mentioned apneic episodes to him.  He does not feel like he needs napping during the day.  His Epworth scale today is noted to be 5.  He has noted weight gain over the last year worse with the COVID-19 pandemic as he has not been able to go to the gym which he used to do at least 3 times a week.  He notes that previously he  used to weigh 230-240 pounds but now is in the 260 range.  He also have significant truncal obesity.  Past medical history, surgical history, family history have been reviewed.  They are as noted.  Her history is significant for having smoked 2 packs of cigarettes per day for approximately 25 years.  He quit in 1990.  He used to work in [employer].  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  Review of Systems  Constitutional: Positive for unexpected weight change (Weight gain).  HENT: Negative.   Eyes: Negative.   Respiratory: Positive for shortness of breath. Negative for apnea, cough, chest tightness and wheezing.   Cardiovascular: Positive for leg swelling.  Gastrointestinal: Negative.   Endocrine: Negative.   Genitourinary: Negative.   Musculoskeletal: Negative.   Skin: Negative.   Allergic/Immunologic: Negative.   Neurological: Negative.   Hematological: Negative.   Psychiatric/Behavioral: Negative.   All other systems reviewed and are negative.      Objective:   Physical Exam Vitals signs and nursing note reviewed.  Constitutional:      General: He is not in acute distress.    Appearance: He is obese. He is not ill-appearing.  HENT:     Head: Normocephalic and atraumatic.     Right Ear: External ear normal.  Left Ear: External ear normal.     Nose:     Comments: Nose/mouth/throat not examined due to masking requirements for COVID 19. Eyes:     General: No scleral icterus.    Conjunctiva/sclera: Conjunctivae normal.     Pupils: Pupils are equal, round, and reactive to light.  Neck:     Musculoskeletal: Neck supple.     Thyroid: No thyromegaly.     Vascular: No JVD.     Trachea: Trachea and phonation normal.  Cardiovascular:     Rate and Rhythm: Regular rhythm. Bradycardia present.     Chest Wall: PMI is not displaced.     Pulses: Normal pulses.     Heart sounds: Normal heart sounds, S1 normal and S2 normal. No  murmur.  Pulmonary:     Effort: Pulmonary effort is normal.     Breath sounds: Normal breath sounds. No wheezing or rhonchi.  Abdominal:     General: Abdomen is protuberant. There is no distension.     Palpations: Abdomen is soft.  Musculoskeletal: Normal range of motion.     Right lower leg: 2+ Edema present.     Left lower leg: 2+ Edema present.  Lymphadenopathy:     Cervical: No cervical adenopathy.  Skin:    General: Skin is warm and dry.  Neurological:     General: No focal deficit present.     Mental Status: He is alert and oriented to person, place, and time.  Psychiatric:        Mood and Affect: Mood normal.        Behavior: Behavior normal.   We performed ambulatory oximetry today the patient did not have any desaturations with exercise.  He maintained saturations at 95-93% consistently.  Patient had a spirometry performed on January 2019 that showed an FEV1 of 2.4 L or 76% of predicted and FVC of 3.4 L or 80% of predicted.  FEV1 percent was 70%.  Though this was read as a "normal spirometry " it appears that the patient has a mixed defect mild obstruction and mild restriction.  The patient will need formal PFTs with lung volumes to determine this issue.  Irregardless, the defect is of such mild character that it does not explain the patient's dyspnea.  Assessment & Plan:  1.  Dyspnea (shortness of breath) on exertion: I cannot ascribe this to pulmonary etiology.  Spirometry performed in January of 2019 showed very mild defects.  However, he should be tested with formal PFTs as this will give [place] an idea of not only spirometric values but lung volumes.  We will see the patient in follow-up in 6 to 8 weeks time.  He is to contact us prior to that time should any new difficulties arise.  2.  Snoring: There is suggestion of potential sleep apnea.  We will schedule the patient for a home sleep study.  Other evidence of potential sleep apnea includes diastolic dysfunction and lower  extremity edema.  Patient does have body habitus to suggest potential OSA.  3.  COPD GOLD class I: Will give the patient a trial of Anoro Ellipta, 1 inhalation daily.  PFTs have been scheduled as noted above.  4.  Grade 1 diastolic dysfunction: This issue adds complexity to his management, this also likely adds to his issues with dyspnea above.  5.  Obesity BMI of 34: Patient has significant truncal obesity. This also may be adding to his sensation of dyspnea.  He notes that he has gained significant  amount of weight as his activity has declined due to confinement in the setting of COVID-19 pandemic.    Thank you for allowing me to participate in this patient's care.   This chart was dictated using voice recognition software/Dragon.  Despite best efforts to proofread, errors can occur which can change the meaning.  Any change was purely unintentional.

## 2018-10-11 NOTE — Patient Instructions (Addendum)
1.  We will schedule for breathing test and a repeat echo test.  2.  We will give you a trial of Anoro Ellipta 1 inhalation daily  3.  We will see you in follow-up in 6 to 8 weeks time  4. We will schedule a home sleep test

## 2018-10-19 ENCOUNTER — Other Ambulatory Visit: Payer: Self-pay | Admitting: Family Medicine

## 2018-10-23 ENCOUNTER — Other Ambulatory Visit: Payer: Self-pay

## 2018-10-23 ENCOUNTER — Ambulatory Visit: Payer: Medicare Other

## 2018-10-23 DIAGNOSIS — G4733 Obstructive sleep apnea (adult) (pediatric): Secondary | ICD-10-CM | POA: Diagnosis not present

## 2018-10-23 DIAGNOSIS — R0683 Snoring: Secondary | ICD-10-CM

## 2018-10-25 ENCOUNTER — Ambulatory Visit: Payer: Medicare Other

## 2018-10-26 ENCOUNTER — Ambulatory Visit: Payer: Medicare Other

## 2018-10-26 ENCOUNTER — Other Ambulatory Visit: Payer: Self-pay

## 2018-10-26 ENCOUNTER — Ambulatory Visit (INDEPENDENT_AMBULATORY_CARE_PROVIDER_SITE_OTHER): Payer: Medicare Other

## 2018-10-26 DIAGNOSIS — Z23 Encounter for immunization: Secondary | ICD-10-CM | POA: Diagnosis not present

## 2018-10-31 ENCOUNTER — Telehealth: Payer: Self-pay | Admitting: Pulmonary Disease

## 2018-10-31 DIAGNOSIS — G4733 Obstructive sleep apnea (adult) (pediatric): Secondary | ICD-10-CM | POA: Diagnosis not present

## 2018-10-31 NOTE — Telephone Encounter (Signed)
Pt is aware of results and voiced his understanding.  Pt would like to try avoidance of supine position. Pt will call back to start cpap if he is still having symptoms. Nothing further is needed.

## 2018-10-31 NOTE — Telephone Encounter (Signed)
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 Recommend avoidance of supine position. If patient is noted to have significant symptoms or associated co morbidities, a trial of auto cpap 5-20cm h2O would be indicated.  Left message to relay results.

## 2018-11-04 ENCOUNTER — Other Ambulatory Visit: Payer: Self-pay | Admitting: Family Medicine

## 2018-11-09 MED ORDER — HYDROCHLOROTHIAZIDE 25 MG PO TABS: 25.0000 mg | ORAL_TABLET | Freq: Every day | ORAL | 3 refills | Status: DC

## 2018-12-04 ENCOUNTER — Other Ambulatory Visit: Payer: Self-pay

## 2018-12-04 ENCOUNTER — Telehealth: Payer: Self-pay | Admitting: Pulmonary Disease

## 2018-12-04 NOTE — Telephone Encounter (Signed)
Pt is aware of date/time of covid test.   

## 2018-12-06 ENCOUNTER — Other Ambulatory Visit
Admission: RE | Admit: 2018-12-06 | Discharge: 2018-12-06 | Disposition: A | Discharge status: Home / Self Care | Payer: Medicare Other | Source: Ambulatory Visit | Attending: Pulmonary Disease | Admitting: Pulmonary Disease

## 2018-12-06 ENCOUNTER — Other Ambulatory Visit: Payer: Self-pay

## 2018-12-06 DIAGNOSIS — Z20828 Contact with and (suspected) exposure to other viral communicable diseases: Secondary | ICD-10-CM | POA: Insufficient documentation

## 2018-12-06 DIAGNOSIS — Z01812 Encounter for preprocedural laboratory examination: Secondary | ICD-10-CM | POA: Insufficient documentation

## 2018-12-06 LAB — SARS CORONAVIRUS 2 (TAT 6-24 HRS): SARS Coronavirus 2: NEGATIVE

## 2018-12-07 ENCOUNTER — Ambulatory Visit (INDEPENDENT_AMBULATORY_CARE_PROVIDER_SITE_OTHER): Payer: Medicare Other

## 2018-12-07 ENCOUNTER — Other Ambulatory Visit: Payer: Self-pay

## 2018-12-07 ENCOUNTER — Ambulatory Visit: Payer: Medicare Other | Attending: Pulmonary Disease

## 2018-12-07 DIAGNOSIS — J449 Chronic obstructive pulmonary disease, unspecified: Secondary | ICD-10-CM

## 2018-12-07 DIAGNOSIS — R0602 Shortness of breath: Secondary | ICD-10-CM | POA: Diagnosis not present

## 2018-12-07 MED ORDER — ALBUTEROL SULFATE (2.5 MG/3ML) 0.083% IN NEBU
2.5000 mg | INHALATION_SOLUTION | Freq: Once | RESPIRATORY_TRACT | Status: AC
  Administered 2018-12-07: 2.5 mg via RESPIRATORY_TRACT
  Filled 2018-12-07: qty 3

## 2018-12-11 ENCOUNTER — Other Ambulatory Visit: Payer: Self-pay

## 2018-12-11 ENCOUNTER — Telehealth: Payer: Self-pay | Admitting: Pulmonary Disease

## 2018-12-11 ENCOUNTER — Ambulatory Visit (INDEPENDENT_AMBULATORY_CARE_PROVIDER_SITE_OTHER): Payer: Medicare Other | Admitting: Pulmonary Disease

## 2018-12-11 ENCOUNTER — Encounter: Payer: Self-pay | Admitting: Pulmonary Disease

## 2018-12-11 VITALS — BP 130/72 | HR 63 | Temp 97.3°F | Ht 74.0 in | Wt 262.0 lb

## 2018-12-11 DIAGNOSIS — J449 Chronic obstructive pulmonary disease, unspecified: Secondary | ICD-10-CM

## 2018-12-11 DIAGNOSIS — E669 Obesity, unspecified: Secondary | ICD-10-CM

## 2018-12-11 DIAGNOSIS — R0602 Shortness of breath: Secondary | ICD-10-CM

## 2018-12-11 DIAGNOSIS — I519 Heart disease, unspecified: Secondary | ICD-10-CM

## 2018-12-11 DIAGNOSIS — R0683 Snoring: Secondary | ICD-10-CM | POA: Diagnosis not present

## 2018-12-11 DIAGNOSIS — I5189 Other ill-defined heart diseases: Secondary | ICD-10-CM

## 2018-12-11 NOTE — Patient Instructions (Signed)
1.  I would recommend weight loss and return to a conditioning program.  Even 10 pounds of weight loss will be beneficial.  2.  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  3.  We will see him in follow-up in 6 months time call sooner if your issues with shortness of breath get worse.

## 2018-12-11 NOTE — Telephone Encounter (Signed)
Spoke to pt, who verified that he is not using cpap.  Dr. Patsey Berthold has been made aware of this information verbally.  Nothing further is needed.

## 2018-12-11 NOTE — Progress Notes (Signed)
Subjective:    Patient ID: Jason Solomon, male    DOB: Jun 07, 1940, 78 y.o.   MRN: [id number]  HPI Patient is a 78 year old former smoker (quit 1990) who presents for follow-up on the issue of dyspnea on exertion.  He was initially seen on 11 October 2018.  For the details of that consultation please refer to that note.  Patient was given a trial of Anoro Ellipta at his initial visit.  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  He notes that Anoro makes his dyspnea perhaps a bit better.  He still experiences dyspnea on exertion particularly when doing gardening chores.  Studies performed: He had a sleep study on 31 August that showed only very mild sleep apnea and only in the supine position.  He did not have any issues of note on the nonsupine position.  It was recommended avoidance of supine position however if he continues to have significant symptoms a trial of auto CPAP may be considered.  Patient also had PFTs performed on 15 October that showed that he had an FEV1 of 2.80 L or 87% of predicted and an FVC of 4.56 L or 105% predicted.  Of note these readings are far better than his spirometry performed in January 2020.  His FEV1 percent to 62% indicating very mild obstruction. Lung volumes were normal however his ERV was low indicating that obesity may be responsible for part of his issues with dyspnea.  Diffusion capacity was 66% which was mildly reduced.  2D echo continues to show issues with diastolic dysfunction.  Right-sided pressures were normal no evidence of pulmonary hypertension and right ventricular and right atrial function was also normal.  The patient continues to have no issues with chest pain or palpitations.  He does have ongoing issues with lower extremity edema however this is controlled with Lasix.   Review of Systems  Constitutional: Positive for unexpected weight change (Weight gain).  HENT: Negative.   Eyes: Negative.   Respiratory: Positive for shortness of breath.  Negative for apnea, cough, chest tightness and wheezing.   Cardiovascular: Positive for leg swelling.  Gastrointestinal: Negative.   Endocrine: Negative.   Genitourinary: Negative.   Musculoskeletal: Negative.   Skin: Negative.   Allergic/Immunologic: Negative.   Neurological: Negative.   Hematological: Negative.   Psychiatric/Behavioral: Negative.   All other systems reviewed and are negative.      Objective:   Physical Exam Vitals signs and nursing note reviewed.  Constitutional:      General: He is not in acute distress.    Appearance: He is obese. He is not ill-appearing.  HENT:     Head: Normocephalic and atraumatic.     Right Ear: External ear normal.     Left Ear: External ear normal.     Nose:     Comments: Nose/mouth/throat not examined due to masking requirements for COVID 19. Eyes:     General: No scleral icterus.    Conjunctiva/sclera: Conjunctivae normal.     Pupils: Pupils are equal, round, and reactive to light.  Neck:     Musculoskeletal: Neck supple.     Thyroid: No thyromegaly.     Vascular: No JVD.     Trachea: Trachea and phonation normal.  Cardiovascular:     Rate and Rhythm: Normal rate and regular rhythm.     Chest Wall: PMI is not displaced.     Pulses: Normal pulses.     Heart sounds: Normal heart sounds, S1 normal and S2 normal.  No murmur.  Pulmonary:     Effort: Pulmonary effort is normal.     Breath sounds: Normal breath sounds. No wheezing or rhonchi.  Abdominal:     General: Abdomen is protuberant. There is no distension.     Palpations: Abdomen is soft.  Musculoskeletal: Normal range of motion.     Right lower leg: 1+ Edema present.     Left lower leg: 1+ Edema present.  Lymphadenopathy:     Cervical: No cervical adenopathy.  Skin:    General: Skin is warm and dry.  Neurological:     General: No focal deficit present.     Mental Status: He is alert and oriented to person, place, and time.  Psychiatric:        Mood and Affect:  Mood normal.        Behavior: Behavior normal.     Assessment & Plan:  1.  Dyspnea (shortness of breath) on exertion: I cannot ascribe this entirely to pulmonary etiology.  Most recent pulmonary function testing shows that patient has very mild COPD and he appears to be well compensated in this regard please see details of the study as above.  He does have issues with diastolic dysfunction, no evidence of right-sided dysfunction.  I believe that his dyspnea has likely aggravated by obesity, he has significant truncal obesity and his ERV is only 9% on PFTs.  This is indicative of obesity.  I have recommended weight loss and to restart a conditioning program as he appears to have benefited from this previously.  2.  Snoring: Very mild sleep apnea only in the supine position.  Recommendations are to avoid the supine position.  Weight loss is also recommended even modest weight loss of 10 pounds should help in this regard.  Should he become more symptomatic trial of AutoPap may be  considered.Currently however, he is not symptomatic in this regard.  3.  COPD GOLD class I:  Continue Anoro Ellipta, 1 inhalation daily. He has shown improvement on his pulmonary function from prior.  4.  Grade 1 diastolic dysfunction: This issue adds complexity to his management, suspect that this adds to his issues with dyspnea noted above.    5.  Obesity BMI of 34: Patient has significant truncal obesity.  ERV is 9% indicating the city.  Suspect that this indeed does affect his sensation of dyspnea as noted above.  Recommendations were for weight loss.   This chart was dictated using voice recognition software/Dragon.  Despite best efforts to proofread, errors can occur which can change the meaning.  Any change was purely unintentional.

## 2019-02-07 ENCOUNTER — Ambulatory Visit (INDEPENDENT_AMBULATORY_CARE_PROVIDER_SITE_OTHER): Payer: Medicare Other | Admitting: Internal Medicine

## 2019-02-07 ENCOUNTER — Encounter: Payer: Self-pay | Admitting: Internal Medicine

## 2019-02-07 ENCOUNTER — Other Ambulatory Visit: Payer: Self-pay

## 2019-02-07 VITALS — BP 130/82 | HR 72 | Ht 73.5 in | Wt 263.0 lb

## 2019-02-07 DIAGNOSIS — I2510 Atherosclerotic heart disease of native coronary artery without angina pectoris: Secondary | ICD-10-CM | POA: Diagnosis not present

## 2019-02-07 DIAGNOSIS — R0609 Other forms of dyspnea: Secondary | ICD-10-CM

## 2019-02-07 DIAGNOSIS — R0600 Dyspnea, unspecified: Secondary | ICD-10-CM | POA: Diagnosis not present

## 2019-02-07 DIAGNOSIS — I10 Essential (primary) hypertension: Secondary | ICD-10-CM | POA: Diagnosis not present

## 2019-02-07 MED ORDER — AMLODIPINE BESYLATE 2.5 MG PO TABS: 2.5000 mg | ORAL_TABLET | Freq: Every day | ORAL | 1 refills | Status: DC

## 2019-02-07 NOTE — Patient Instructions (Signed)
Medication Instructions:  Your physician has recommended you make the following change in your medication:  1- START Amlodipine 2.5 mg by mouth once a day.  *If you need a refill on your cardiac medications before your next appointment, please call your pharmacy*  Lab Work: none If you have labs (blood work) drawn today and your tests are completely normal, you will receive your results only by: Marland Kitchen MyChart Message (if you have MyChart) OR . A paper copy in the mail If you have any lab test that is abnormal or we need to change your treatment, we will call you to review the results.  Testing/Procedures: none  Follow-Up: At [facility], you and your health needs are our priority.  As part of our continuing mission to provide you with exceptional heart care, we have created designated Provider Care Teams.  These Care Teams include your primary Cardiologist (physician) and Advanced Practice Providers (APPs -  Physician Assistants and Nurse Practitioners) who all work together to provide you with the care you need, when you need it.  Your next appointment:   1 month(s)  The format for your next appointment:   Virtual Visit   Provider:    You may see DR Harrell Gave END or one of the following Advanced Practice Providers on your designated Care Team:    Murray Hodgkins, NP  Christell Faith, PA-C  Marrianne Mood, PA-C

## 2019-02-07 NOTE — Progress Notes (Signed)
Follow-up Outpatient Visit Date: 02/07/2019  Primary Care Provider: Jinny Sanders, MD [address] [place] [address]  Chief Complaint: Shortness of breath  HPI:  [name] is a 78 y.o. 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  I last saw him in August, at which time he reported stable exertional dyspnea.  Leg edema had not improved after transitioning from HCTZ to furosemide.  Pulmonary consultation was recommended.  It was felt that his dyspnea was not due to primary pulmonary process but rather a combination of factors including diastolic dysfunction, obesity, mild COPD, and very mild sleep apnea only in the supine position.  Today, [name] reports that he feels about the same in regard to his exertional dyspnea.  He reports having been less active over the last several months with resultant weight gain.  He denies chest pain, palpitations, and orthopnea.  Chronic calf edema is stable.  --------------------------------------------------------------------------------------------------  Cardiovascular History & Procedures: Cardiovascular Problems:  Shortness of breath  Risk Factors:  Coronary artery calcification  Cath/PCI:  None  CV Surgery:  None  EP Procedures and Devices:  None  Non-Invasive Evaluation(s):  Cardiac CTA (09/01/2018): LMCA normal.  LAD with 50-69% proximal and mid vessel disease.  LCx with 60-69% stenosis in midvessel.  Dominant RCA with diffuse 25-49% stenosis in the proximal and mid vessel.  Distal RCA with 50-69% calcified plaque.  CTFFR is not significant in any vessel.  Coronary calcium score 1,093 Agatston units (77 percentile for age/sex).  Aortic atherosclerosis noted.  TTE (11/18/2017): Normal  LV size with mild LVH. LVEF 55-60% with normal wall motion and grade 1 diastolic dysfunction. Mild MR. Mild biatrial enlargement. Normal RV size and function. Normal PA pressure.  Pharmacologic myocardial perfusion stress test (10/31/2017): Intermediate risk study with fixed defect involving the inferior, inferolateral, and apical segments thought to be artifact versus scar. No ischemia was seen. LVEF 41%.  Exercise stress echo (03/19/2015): Normal study without ischemia. Frequent PAC's and PVC's noted at rest and during stress.  Recent CV Pertinent Labs: Lab Results  Component Value Date   CHOL 114 02/01/2018   HDL 55.20 02/01/2018   LDLCALC 44 02/01/2018   TRIG 75.0 02/01/2018   CHOLHDL 2 02/01/2018   K 3.5 08/31/2018   MG 2.0 01/16/2014   BUN 23 08/31/2018   CREATININE 1.19 08/31/2018    Past medical and surgical history were reviewed and updated in EPIC.  Current Meds  Medication Sig  . albuterol (PROVENTIL HFA;VENTOLIN HFA) 108 (90 Base) MCG/ACT inhaler Inhale 2 puffs into the lungs every 6 (six) hours as needed for wheezing or shortness of breath.  . allopurinol (ZYLOPRIM) 300 MG tablet TAKE 1 TABLET BY MOUTH ONCE DAILY  . aspirin 81 MG EC tablet Take by mouth daily.  Marland Kitchen atorvastatin (LIPITOR) 20 MG tablet Take 1 tablet (20 mg total) by mouth daily.  . Calcium Carb-Cholecalciferol (CALCIUM 1000 + D PO) Take by mouth.  . hydrochlorothiazide (HYDRODIURIL) 25 MG tablet Take 1 tablet (25 mg total) by mouth daily.  Marland Kitchen levothyroxine (SYNTHROID) 137 MCG tablet TAKE 1 TABLET BY MOUTH  DAILY BEFORE BREAKFAST  . mometasone (NASONEX) 50 MCG/ACT nasal spray Place 2 sprays into the nose as needed.  . montelukast (SINGULAIR) 10 MG tablet Take 1 tablet (10 mg total) by  mouth daily as needed. Only when flying  . polyethylene glycol (MIRALAX / GLYCOLAX) packet Take 17 g by mouth daily as needed.  . sildenafil (VIAGRA) 100 MG tablet Take 1 tablet (100 mg total) by mouth daily as needed for  erectile dysfunction.  Marland Kitchen terazosin (HYTRIN) 5 MG capsule TAKE 1 CAPSULE BY MOUTH  ONCE DAILY  . umeclidinium-vilanterol (ANORO ELLIPTA) 62.5-25 MCG/INH AEPB Inhale 1 puff into the lungs every morning.    Allergies: Ace inhibitors and Angiotensin receptor blockers  Social History   Tobacco Use  . Smoking status: Former Smoker    Packs/day: 2.00    Years: 30.00    Pack years: 60.00    Quit date: 1990    Years since quitting: 30.9  . Smokeless tobacco: Never Used  Substance Use Topics  . Alcohol use: Yes    Alcohol/week: 5.0 standard drinks    Types: 5 Glasses of wine per week  . Drug use: No    Family History  Problem Relation Age of Onset  . Hypertension Mother   . COPD Brother   . Lung cancer Brother 89  . Throat cancer Brother   . Hypertension Father   . Stroke Father   . Cancer Maternal Grandmother        unknown  . Thyroid disease Neg Hx     Review of Systems: A 12-system review of systems was performed and was negative except as noted in the HPI.  --------------------------------------------------------------------------------------------------  Physical Exam: BP 130/82 (BP Location: Left Arm, Patient Position: Sitting, Cuff Size: Large)   Pulse 72   Ht 6' 1.5" (1.867 m)   Wt 263 lb (119.3 kg)   SpO2 98%   BMI 34.23 kg/m   General:  NAD. HEENT: No conjunctival pallor or scleral icterus. Facemask in place. Neck: Supple without lymphadenopathy, thyromegaly, JVD, or HJR. Lungs: Normal work of breathing. Clear to auscultation bilaterally without wheezes or crackles. Heart: Regular rate and rhythm with occasional extrasystoles.  No murmurs, rubs, or gallops. Non-displaced PMI. Abd: Bowel sounds present. Soft, NT/ND without hepatosplenomegaly Ext: 1+ calf edema bilaterally. 2+ radial pulses. Skin: Warm and dry without rash.  EKG:  NSR with first degree AV block as well as occasional PAC's and PVC's.  Left axis deviation.  PAC's/PVC's more frequent than on  prior tracing from 10/04/2018.  Lab Results  Component Value Date   WBC 3.8 (L) 10/07/2017   HGB 13.9 10/07/2017   HCT 42.5 10/07/2017   MCV 92.6 10/07/2017   PLT 181.0 10/07/2017    Lab Results  Component Value Date   NA 141 08/31/2018   K 3.5 08/31/2018   CL 103 08/31/2018   CO2 29 08/31/2018   BUN 23 08/31/2018   CREATININE 1.19 08/31/2018   GLUCOSE 106 (H) 08/31/2018   ALT 28 02/01/2018    Lab Results  Component Value Date   CHOL 114 02/01/2018   HDL 55.20 02/01/2018   LDLCALC 44 02/01/2018   TRIG 75.0 02/01/2018   CHOLHDL 2 02/01/2018    --------------------------------------------------------------------------------------------------  ASSESSMENT AND PLAN: Chronic dyspnea on exertion: This has been longstanding and remains stable.  I suspect that [name] symptoms are multifactorial, including mild diastolic dysfunction and COPD.  It is possible that an element of microvascular dysfunction is also contributing.  We have agreed to try amlodipine 2.5 mg daily to see if this helps his symptoms.  We will defer additional testing at this time.  [name] was previously on furosemide but appears to have  switched back to HCTZ; I will defer changing his diuretic regimen today.  Nonobstructive coronary artery disease: Continue aspirin and atorvastatin to prevent progression of disease.  Hypertension: Upper normal BP today.  As above, we will add low-dose amlodipine for antianginal therapy (question if DOE is anginal equivalent in the setting of microvascular dysfunction) and blood pressure control.  Follow-up: Virtual visit in 1 month.  Nelva Bush, MD 02/07/2019 8:50 AM

## 2019-02-08 ENCOUNTER — Ambulatory Visit (INDEPENDENT_AMBULATORY_CARE_PROVIDER_SITE_OTHER): Payer: Medicare Other

## 2019-02-08 ENCOUNTER — Ambulatory Visit: Payer: Medicare Other

## 2019-02-08 ENCOUNTER — Telehealth: Payer: Self-pay | Admitting: Family Medicine

## 2019-02-08 ENCOUNTER — Telehealth: Payer: Self-pay | Admitting: Internal Medicine

## 2019-02-08 ENCOUNTER — Other Ambulatory Visit (INDEPENDENT_AMBULATORY_CARE_PROVIDER_SITE_OTHER): Payer: Medicare Other

## 2019-02-08 ENCOUNTER — Encounter: Payer: Self-pay | Admitting: Internal Medicine

## 2019-02-08 VITALS — BP 130/82 | Wt 263.0 lb

## 2019-02-08 DIAGNOSIS — I10 Essential (primary) hypertension: Secondary | ICD-10-CM

## 2019-02-08 DIAGNOSIS — E7800 Pure hypercholesterolemia, unspecified: Secondary | ICD-10-CM

## 2019-02-08 DIAGNOSIS — Z0000 Encounter for general adult medical examination without abnormal findings: Secondary | ICD-10-CM

## 2019-02-08 DIAGNOSIS — E890 Postprocedural hypothyroidism: Secondary | ICD-10-CM

## 2019-02-08 LAB — LIPID PANEL
Cholesterol: 129 mg/dL (ref 0–200)
HDL: 52 mg/dL (ref >39.00)
LDL Cholesterol: 60 mg/dL (ref 0–99)
NonHDL: 76.78
Total CHOL/HDL Ratio: 2
Triglycerides: 84 mg/dL (ref 0.0–149.0)
VLDL: 16.8 mg/dL (ref 0.0–40.0)

## 2019-02-08 LAB — COMPREHENSIVE METABOLIC PANEL
ALT: 13 U/L (ref 0–53)
AST: 18 U/L (ref 0–37)
Albumin: 3.8 g/dL (ref 3.5–5.2)
Alkaline Phosphatase: 44 U/L (ref 39–117)
BUN: 20 mg/dL (ref 6–23)
CO2: 33 mEq/L — ABNORMAL HIGH (ref 19–32)
Calcium: 9.5 mg/dL (ref 8.4–10.5)
Chloride: 105 mEq/L (ref 96–112)
Creatinine, Ser: 1.1 mg/dL (ref 0.40–1.50)
GFR: 78.3 mL/min (ref >60.00)
Glucose, Bld: 100 mg/dL — ABNORMAL HIGH (ref 70–99)
Potassium: 4 mEq/L (ref 3.5–5.1)
Sodium: 143 mEq/L (ref 135–145)
Total Bilirubin: 1.2 mg/dL (ref 0.2–1.2)
Total Protein: 6.5 g/dL (ref 6.0–8.3)

## 2019-02-08 NOTE — Progress Notes (Addendum)
Subjective:   Jason Solomon is a 78 y.o. male who presents for Medicare Annual/Subsequent preventive examination.  Review of Systems: N/A   This visit is being conducted through telemedicine via telephone at the nurse health advisor's home address due to the COVID-19 pandemic. This patient has given me verbal consent via doximity to conduct this visit, patient states they are participating from their home address. Patient and myself are on the telephone call. There is no referral for this visit. Some vital signs may be absent or patient reported.    Patient identification: identified by name, DOB, and current address   Cardiac Risk Factors include: advanced age (>61men, >14 women);male gender;hypertension;Other (see comment), Risk factor comments: Hypercholesterolemia     Objective:    Vitals: BP 130/82   Wt 263 lb (119.3 kg)   BMI 34.23 kg/m   Body mass index is 34.23 kg/m.  Advanced Directives 02/08/2019 02/01/2018 01/28/2017 01/07/2016 01/02/2015 10/03/2014 09/23/2014  Does Patient Have a Medical Advance Directive? No No Yes Yes No No No  Type of Advance Directive - Public librarian;Living will [place];Living will - - -  Does patient want to make changes to medical advance directive? - - - No - Patient declined - - -  Copy of [place] in Chart? - - No - copy requested No - copy requested - - -  Would patient like information on creating a medical advance directive? No - Patient declined No - Patient declined - - No - patient declined information Yes - Scientist, clinical (histocompatibility and immunogenetics) given -    Tobacco Social History   Tobacco Use  Smoking Status Former Smoker  . Packs/day: 2.00  . Years: 30.00  . Pack years: 60.00  . Quit date: 42  . Years since quitting: 30.9  Smokeless Tobacco Never Used     Counseling given: Not Answered   Clinical Intake:  Pre-visit preparation completed: Yes  Pain : No/denies pain      Nutritional Risks: None Diabetes: No  How often do you need to have someone help you when you read instructions, pamphlets, or other written materials from your doctor or pharmacy?: 1 - Never What is the last grade level you completed in school?: Masters  Interpreter Needed?: No  Information entered by :: CJohnson, LPN  Past Medical History:  Diagnosis Date  . 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 COPD (chronic obstructive pulmonary disease) ([place])   . DIVERTICULOSIS, COLON 01/12/2007  . 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 GERD (gastroesophageal reflux disease)   . GOITER, MULTINODULAR 05/04/2010  . GOUT 01/12/2007  . Heart murmur    childhood only  . HYPERCHOLESTEROLEMIA 06/20/2007  . HYPERTENSION 01/12/2007  . HYPERTHYROIDISM 04/24/2010  . OBESITY 01/12/2007  . PROSTATE SPECIFIC ANTIGEN, ELEVATED 10/08/2009  . Renal calculi   . Shortness of breath dyspnea    with exertion   Past Surgical History:  Procedure Laterality Date  . CARDIOVASCULAR STRESS TEST  2006   negative  . HAND SURGERY  1949   left hand laceration  . HERNIA REPAIR  1990's   right and left inguinal hernia  . KNEE SURGERY  1990's  . TONSILLECTOMY  1945   Family History  Problem Relation Age of Onset  . Hypertension Mother   . COPD Brother   . Lung cancer Brother 67  . Throat cancer Brother   . Hypertension Father   . Stroke Father   . Cancer  Maternal Grandmother        unknown  . Thyroid disease Neg Hx    Social History   Socioeconomic History  . Marital status: Married    Spouse name: Not on file  . Number of children: 4  . Years of education: Not on file  . Highest education level: Not on file  Occupational History    Employer: RETIRED    Comment: Retired  Tobacco Use  . Smoking status: Former Smoker    Packs/day: 2.00    Years: 30.00    Pack years: 60.00    Quit date: 1990    Years since quitting: 30.9  . Smokeless tobacco: Never Used  Substance and Sexual  Activity  . Alcohol use: Yes    Alcohol/week: 5.0 standard drinks    Types: 5 Glasses of wine per week  . Drug use: No  . Sexual activity: Never  Other Topics Concern  . Not on file  Social History Narrative   Regular exercise-yes, walks , golf   Occupation: Retired Hydrographic surveyor   Diet: (+) fruit, (+) veggies, (+) salad, (+) H20   Children: 4, healthy   Married x 43 years   Living will, [place], daughter. Full code. (reviewed 2013)   Social Determinants of Health   Financial Resource Strain: Low Risk   . Difficulty of Paying Living Expenses: Not hard at all  Food Insecurity: No Food Insecurity  . Worried About Charity fundraiser in the Last Year: Never true  . Ran Out of Food in the Last Year: Never true  Transportation Needs: No Transportation Needs  . Lack of Transportation (Medical): No  . Lack of Transportation (Non-Medical): No  Physical Activity: Inactive  . Days of Exercise per Week: 0 days  . Minutes of Exercise per Session: 0 min  Stress: No Stress Concern Present  . Feeling of Stress : Not at all  Social Connections:   . Frequency of Communication with Friends and Family: Not on file  . Frequency of Social Gatherings with Friends and Family: Not on file  . Attends Religious Services: Not on file  . Active Member of Clubs or Organizations: Not on file  . Attends Archivist Meetings: Not on file  . Marital Status: Not on file    Outpatient Encounter Medications as of 02/08/2019  Medication Sig  . albuterol (PROVENTIL HFA;VENTOLIN HFA) 108 (90 Base) MCG/ACT inhaler Inhale 2 puffs into the lungs every 6 (six) hours as needed for wheezing or shortness of breath.  . allopurinol (ZYLOPRIM) 300 MG tablet TAKE 1 TABLET BY MOUTH ONCE DAILY  . amLODipine (NORVASC) 2.5 MG tablet Take 1 tablet (2.5 mg total) by mouth daily.  Marland Kitchen aspirin 81 MG EC tablet Take by mouth daily.  Marland Kitchen atorvastatin (LIPITOR) 20 MG tablet Take 1 tablet (20 mg  total) by mouth daily.  . Calcium Carb-Cholecalciferol (CALCIUM 1000 + D PO) Take by mouth.  . hydrochlorothiazide (HYDRODIURIL) 25 MG tablet Take 1 tablet (25 mg total) by mouth daily.  Marland Kitchen levothyroxine (SYNTHROID) 137 MCG tablet TAKE 1 TABLET BY MOUTH  DAILY BEFORE BREAKFAST  . mometasone (NASONEX) 50 MCG/ACT nasal spray Place 2 sprays into the nose as needed.  . montelukast (SINGULAIR) 10 MG tablet Take 1 tablet (10 mg total) by mouth daily as needed. Only when flying  . polyethylene glycol (MIRALAX / GLYCOLAX) packet Take 17 g by mouth daily as needed.  . sildenafil (VIAGRA) 100 MG tablet  Take 1 tablet (100 mg total) by mouth daily as needed for erectile dysfunction.  Marland Kitchen terazosin (HYTRIN) 5 MG capsule TAKE 1 CAPSULE BY MOUTH  ONCE DAILY  . umeclidinium-vilanterol (ANORO ELLIPTA) 62.5-25 MCG/INH AEPB Inhale 1 puff into the lungs every morning.   No facility-administered encounter medications on file as of 02/08/2019.    Activities of Daily Living In your present state of health, do you have any difficulty performing the following activities: 02/08/2019  Hearing? N  Vision? N  Difficulty concentrating or making decisions? N  Walking or climbing stairs? N  Dressing or bathing? N  Doing errands, shopping? N  Preparing Food and eating ? N  Using the Toilet? N  In the past six months, have you accidently leaked urine? N  Do you have problems with loss of bowel control? N  Managing your Medications? N  Managing your Finances? N  Housekeeping or managing your Housekeeping? N  Some recent data might be hidden    Patient Care Team: Jinny Sanders, MD as PCP - General Birder Robson, MD as Referring Physician (Ophthalmology) Renato Shin, MD as Consulting Physician (Endocrinology) Nickie Retort, MD as Consulting Physician (Urology)   Assessment:   This is a routine wellness examination for Jason Solomon.  Exercise Activities and Dietary recommendations Current Exercise Habits:  The patient does not participate in regular exercise at present, Exercise limited by: None identified  Goals    . Increase physical activity     Starting 02/01/2018, I will continue to exercise for 45-60 minutes 3 days per week and to bowl or golf as desired.      . Patient Stated     02/08/2019, I will start back exercising and trying to lose weight once the pandemic gets under control.        Fall Risk Fall Risk  02/08/2019 02/01/2018 01/12/2018 01/28/2017 01/07/2016  Falls in the past year? 0 0 0 No No  Comment - - Emmi Telephone Survey: data to providers prior to load - -  Number falls in past yr: 0 - - - -  Injury with Fall? 0 - - - -  Risk for fall due to : Medication side effect - - - -  Follow up Falls evaluation completed;Falls prevention discussed - - - -   Is the patient's home free of loose throw rugs in walkways, pet beds, electrical cords, etc?   yes      Grab bars in the bathroom? no      Handrails on the stairs?   no      Adequate lighting?   yes  Timed Get Up and Go Performed: N/A  Depression Screen PHQ 2/9 Scores 02/08/2019 02/01/2018 01/28/2017 01/07/2016  PHQ - 2 Score 0 0 0 0  PHQ- 9 Score 0 0 - -    Cognitive Function MMSE - Mini Mental State Exam 02/08/2019 02/01/2018 01/28/2017 01/07/2016  Orientation to time 5 5 5 5   Orientation to Place 5 5 5 5   Registration 3 3 3 3   Attention/ Calculation 5 0 5 0  Recall 3 3 2 3   Language- name 2 objects - 0 2 0  Language- repeat 1 1 1 1   Language- follow 3 step command - 3 3 3   Language- read & follow direction - 0 1 0  Write a sentence - 0 1 0  Copy design - 0 1 0  Total score - 20 29 20   Mini Cog  Mini-Cog screen was completed. Maximum score  is 22. A value of 0 denotes this part of the MMSE was not completed or the patient failed this part of the Mini-Cog screening.       Immunization History  Administered Date(s) Administered  . Fluad Quad(high Dose 65+) 10/26/2018  . H1N1 04/18/2008  . Influenza  Split 11/24/2010, 11/25/2011  . Influenza Whole 12/16/2006, 11/20/2007, 12/04/2008, 11/26/2009  . Influenza,inj,Quad PF,6+ Mos 10/31/2012, 11/20/2013, 11/29/2014, 11/25/2015, 11/30/2016, 11/30/2016, 12/01/2017  . Pneumococcal Conjugate-13 01/01/2014  . Pneumococcal Polysaccharide-23 07/30/2008  . Td 04/23/2006  . Zoster 11/16/2012  . Zoster Recombinat (Shingrix) 11/06/2018    Qualifies for Shingles Vaccine? Yes  Screening Tests Health Maintenance  Topic Date Due  . TETANUS/TDAP  02/22/2019 (Originally 04/22/2016)  . INFLUENZA VACCINE  Completed  . PNA vac Low Risk Adult  Completed   Cancer Screenings: Lung: Low Dose CT Chest recommended if Age 52-80 years, 30 pack-year currently smoking OR have quit w/in 15 years. Patient does not qualify. Colorectal: no longer required  Additional Screenings:  Hepatitis C Screening: N/A      Plan:   Patient will start back exercising and losing weight after the pandemic.   I have personally reviewed and noted the following in the patient's chart:   . Medical and social history . Use of alcohol, tobacco or illicit drugs  . Current medications and supplements . Functional ability and status . Nutritional status . Physical activity . Advanced directives . List of other physicians . Hospitalizations, surgeries, and ER visits in previous 12 months . Vitals . Screenings to include cognitive, depression, and falls . Referrals and appointments  In addition, I have reviewed and discussed with patient certain preventive protocols, quality metrics, and best practice recommendations. A written personalized care plan for preventive services as well as general preventive health recommendations were provided to patient.     Andrez Grime, LPN  [id number]

## 2019-02-08 NOTE — Telephone Encounter (Signed)
Patient calling to make office aware Patient saw Dr End yesterday and wanted to him know he had lab work completed with Dr Diona Browner and it should be available for review by tomorrow

## 2019-02-08 NOTE — Telephone Encounter (Signed)
-----   Message from Ellamae Sia sent at 01/30/2019  3:24 PM EST ----- Regarding: lab orders for Thursday, 12.17.20 Patient is scheduled for CPX labs, please order future labs, Thanks , Karna Christmas

## 2019-02-08 NOTE — Patient Instructions (Signed)
[name] , Thank you for taking time to come for your Medicare Wellness Visit. I appreciate your ongoing commitment to your health goals. Please review the following plan we discussed and let me know if I can assist you in the future.   Screening recommendations/referrals: Colonoscopy: no longer required  Recommended yearly ophthalmology/optometry visit for glaucoma screening and checkup Recommended yearly dental visit for hygiene and checkup  Vaccinations: Influenza vaccine: Up to date, completed 10/26/2018 Pneumococcal vaccine: Completed series Tdap vaccine: decline Shingles vaccine: #1 completed 11/06/2018    Advanced directives: Advance directive discussed with you today. Even though you declined this today please call our office should you change your mind and we can give you the proper paperwork for you to fill out.  Conditions/risks identified: hypertension, hypercholesterolemia  Next appointment: 02/20/2019 @ 8:40 am   Preventive Care 65 Years and Older, Male Preventive care refers to lifestyle choices and visits with your health care provider that can promote health and wellness. What does preventive care include?  A yearly physical exam. This is also called an annual well check.  Dental exams once or twice a year.  Routine eye exams. Ask your health care provider how often you should have your eyes checked.  Personal lifestyle choices, including:  Daily care of your teeth and gums.  Regular physical activity.  Eating a healthy diet.  Avoiding tobacco and drug use.  Limiting alcohol use.  Practicing safe sex.  Taking low doses of aspirin every day.  Taking vitamin and mineral supplements as recommended by your health care provider. What happens during an annual well check? The services and screenings done by your health care provider during your annual well check will depend on your age, overall health, lifestyle risk factors, and family history of  disease. Counseling  Your health care provider may ask you questions about your:  Alcohol use.  Tobacco use.  Drug use.  Emotional well-being.  Home and relationship well-being.  Sexual activity.  Eating habits.  History of falls.  Memory and ability to understand (cognition).  Work and work Statistician. Screening  You may have the following tests or measurements:  Height, weight, and BMI.  Blood pressure.  Lipid and cholesterol levels. These may be checked every 5 years, or more frequently if you are over 36 years old.  Skin check.  Lung cancer screening. You may have this screening every year starting at age 8 if you have a 30-pack-year history of smoking and currently smoke or have quit within the past 15 years.  Fecal occult blood test (FOBT) of the stool. You may have this test every year starting at age 68.  Flexible sigmoidoscopy or colonoscopy. You may have a sigmoidoscopy every 5 years or a colonoscopy every 10 years starting at age 25.  Prostate cancer screening. Recommendations will vary depending on your family history and other risks.  Hepatitis C blood test.  Hepatitis B blood test.  Sexually transmitted disease (STD) testing.  Diabetes screening. This is done by checking your blood sugar (glucose) after you have not eaten for a while (fasting). You may have this done every 1-3 years.  Abdominal aortic aneurysm (AAA) screening. You may need this if you are a current or former smoker.  Osteoporosis. You may be screened starting at age 32 if you are at high risk. Talk with your health care provider about your test results, treatment options, and if necessary, the need for more tests. Vaccines  Your health care provider may recommend  certain vaccines, such as:  Influenza vaccine. This is recommended every year.  Tetanus, diphtheria, and acellular pertussis (Tdap, Td) vaccine. You may need a Td booster every 10 years.  Zoster vaccine. You may  need this after age 71.  Pneumococcal 13-valent conjugate (PCV13) vaccine. One dose is recommended after age 28.  Pneumococcal polysaccharide (PPSV23) vaccine. One dose is recommended after age 52. Talk to your health care provider about which screenings and vaccines you need and how often you need them. This information is not intended to replace advice given to you by your health care provider. Make sure you discuss any questions you have with your health care provider. Document Released: 03/07/2015 Document Revised: 10/29/2015 Document Reviewed: 12/10/2014 Elsevier Interactive Patient Education  2017 [place] Prevention in the Home Falls can cause injuries. They can happen to people of all ages. There are many things you can do to make your home safe and to help prevent falls. What can I do on the outside of my home?  Regularly fix the edges of walkways and driveways and fix any cracks.  Remove anything that might make you trip as you walk through a door, such as a raised step or threshold.  Trim any bushes or trees on the path to your home.  Use bright outdoor lighting.  Clear any walking paths of anything that might make someone trip, such as rocks or tools.  Regularly check to see if handrails are loose or broken. Make sure that both sides of any steps have handrails.  Any raised decks and porches should have guardrails on the edges.  Have any leaves, snow, or ice cleared regularly.  Use sand or salt on walking paths during winter.  Clean up any spills in your garage right away. This includes oil or grease spills. What can I do in the bathroom?  Use night lights.  Install grab bars by the toilet and in the tub and shower. Do not use towel bars as grab bars.  Use non-skid mats or decals in the tub or shower.  If you need to sit down in the shower, use a plastic, non-slip stool.  Keep the floor dry. Clean up any water that spills on the floor as soon as it  happens.  Remove soap buildup in the tub or shower regularly.  Attach bath mats securely with double-sided non-slip rug tape.  Do not have throw rugs and other things on the floor that can make you trip. What can I do in the bedroom?  Use night lights.  Make sure that you have a light by your bed that is easy to reach.  Do not use any sheets or blankets that are too big for your bed. They should not hang down onto the floor.  Have a firm chair that has side arms. You can use this for support while you get dressed.  Do not have throw rugs and other things on the floor that can make you trip. What can I do in the kitchen?  Clean up any spills right away.  Avoid walking on wet floors.  Keep items that you use a lot in easy-to-reach places.  If you need to reach something above you, use a strong step stool that has a grab bar.  Keep electrical cords out of the way.  Do not use floor polish or wax that makes floors slippery. If you must use wax, use non-skid floor wax.  Do not have throw rugs and other  things on the floor that can make you trip. What can I do with my stairs?  Do not leave any items on the stairs.  Make sure that there are handrails on both sides of the stairs and use them. Fix handrails that are broken or loose. Make sure that handrails are as long as the stairways.  Check any carpeting to make sure that it is firmly attached to the stairs. Fix any carpet that is loose or worn.  Avoid having throw rugs at the top or bottom of the stairs. If you do have throw rugs, attach them to the floor with carpet tape.  Make sure that you have a light switch at the top of the stairs and the bottom of the stairs. If you do not have them, ask someone to add them for you. What else can I do to help prevent falls?  Wear shoes that:  Do not have high heels.  Have rubber bottoms.  Are comfortable and fit you well.  Are closed at the toe. Do not wear sandals.  If you  use a stepladder:  Make sure that it is fully opened. Do not climb a closed stepladder.  Make sure that both sides of the stepladder are locked into place.  Ask someone to hold it for you, if possible.  Clearly mark and make sure that you can see:  Any grab bars or handrails.  First and last steps.  Where the edge of each step is.  Use tools that help you move around (mobility aids) if they are needed. These include:  Canes.  Walkers.  Scooters.  Crutches.  Turn on the lights when you go into a dark area. Replace any light bulbs as soon as they burn out.  Set up your furniture so you have a clear path. Avoid moving your furniture around.  If any of your floors are uneven, fix them.  If there are any pets around you, be aware of where they are.  Review your medicines with your doctor. Some medicines can make you feel dizzy. This can increase your chance of falling. Ask your doctor what other things that you can do to help prevent falls. This information is not intended to replace advice given to you by your health care provider. Make sure you discuss any questions you have with your health care provider. Document Released: 12/05/2008 Document Revised: 07/17/2015 Document Reviewed: 03/15/2014 Elsevier Interactive Patient Education  2017 [employer].

## 2019-02-08 NOTE — Progress Notes (Signed)
PCP notes:  Health Maintenance: No gaps noted   Abnormal Screenings: none   Patient concerns: none   Nurse concerns: none   Next PCP appt.: 02/20/2019 @ 8:40 am

## 2019-02-09 NOTE — Progress Notes (Signed)
No critical labs need to be addressed urgently. We will discuss labs in detail at upcoming office visit.   

## 2019-02-09 NOTE — Telephone Encounter (Signed)
Labs reviewed; kidney function and electrolytes are normal.  Cholesterol is under excellent control.  I do not recommend any medication changes from a heart standpoint at this time.  Nelva Bush, MD [facility]

## 2019-02-09 NOTE — Telephone Encounter (Signed)
Spoke to wife. Gave report from Dr. Saunders Revel. No further questions or concerns at this time.   No new orders.   Advised pt to call for any further questions or concerns.

## 2019-02-19 ENCOUNTER — Other Ambulatory Visit: Payer: Self-pay | Admitting: Pulmonary Disease

## 2019-02-20 ENCOUNTER — Encounter: Payer: Self-pay | Admitting: Family Medicine

## 2019-02-20 ENCOUNTER — Encounter: Payer: Medicare Other | Admitting: Family Medicine

## 2019-02-20 ENCOUNTER — Ambulatory Visit (INDEPENDENT_AMBULATORY_CARE_PROVIDER_SITE_OTHER): Payer: Medicare Other | Admitting: Family Medicine

## 2019-02-20 ENCOUNTER — Other Ambulatory Visit: Payer: Self-pay

## 2019-02-20 VITALS — BP 140/80 | HR 45 | Temp 97.9°F | Ht 72.5 in | Wt 259.5 lb

## 2019-02-20 DIAGNOSIS — E7800 Pure hypercholesterolemia, unspecified: Secondary | ICD-10-CM

## 2019-02-20 DIAGNOSIS — E890 Postprocedural hypothyroidism: Secondary | ICD-10-CM

## 2019-02-20 DIAGNOSIS — I10 Essential (primary) hypertension: Secondary | ICD-10-CM

## 2019-02-20 DIAGNOSIS — I2510 Atherosclerotic heart disease of native coronary artery without angina pectoris: Secondary | ICD-10-CM | POA: Diagnosis not present

## 2019-02-20 DIAGNOSIS — I700 Atherosclerosis of aorta: Secondary | ICD-10-CM

## 2019-02-20 DIAGNOSIS — R0609 Other forms of dyspnea: Secondary | ICD-10-CM

## 2019-02-20 DIAGNOSIS — J449 Chronic obstructive pulmonary disease, unspecified: Secondary | ICD-10-CM | POA: Diagnosis not present

## 2019-02-20 DIAGNOSIS — R0602 Shortness of breath: Secondary | ICD-10-CM

## 2019-02-20 DIAGNOSIS — R0600 Dyspnea, unspecified: Secondary | ICD-10-CM

## 2019-02-20 NOTE — Assessment & Plan Note (Signed)
Tight cholesterol control needed. LDL at goal.

## 2019-02-20 NOTE — Patient Instructions (Signed)
Work on increasing exercise and healthy eating, weight loss.

## 2019-02-20 NOTE — Assessment & Plan Note (Signed)
On anoro.. followed by D.r Patsey Berthold.

## 2019-02-20 NOTE — Assessment & Plan Note (Signed)
LDL at goal on statin. Goal < 70 given CAD.

## 2019-02-20 NOTE — Assessment & Plan Note (Signed)
Well controlled. Continue current medication.  

## 2019-02-20 NOTE — Assessment & Plan Note (Signed)
S/P cardiac work up by Dr. 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  Like due to diastolic dysfunction, obesity, mild COPD and mild sleep apnea. 12/16/2020Started on amlodipine 2.5 mg dailly for  possibleanginal symptoms.

## 2019-02-20 NOTE — Assessment & Plan Note (Signed)
Followed by ENDO Dr. Loanne Drilling.

## 2019-02-20 NOTE — Progress Notes (Signed)
Chief Complaint  Patient presents with  . Annual Exam    Part 2    History of Present Illness: HPI   The patient presents for review of chronic health problems. He/She also has the following acute concerns today: none  The patient saw a LPN or RN for medicare wellness visit.  Prevention and wellness was reviewed in detail. Note reviewed and important notes copied below. Health Maintenance: No gaps noted Abnormal Screenings: None   02/20/19 Hypertension:   Good control. BP Readings from Last 3 Encounters:  02/20/19 140/80  02/08/19 130/82  02/07/19 130/82  Using medication without problems or lightheadedness:  none Chest pain with exertion:none Edema: off and on Short of breath: same Average home BPs: 137/72. Other issues:  Elevated Cholesterol: LDL at goal on atorvastatin 20 mg daily Lab Results  Component Value Date   CHOL 129 02/08/2019   HDL 52.00 02/08/2019   LDLCALC 60 02/08/2019   TRIG 84.0 02/08/2019   CHOLHDL 2 02/08/2019  Using medications without problems: Muscle aches: none Diet compliance: moderate Exercise:  Walking. Other complaints:   DOE: S/P cardiac work up by Dr. 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  Like due to diastolic dysfunction, obesity, mild COPD and mild sleep apnea. 12/16/2020Started on amlodipine 2.5 mg dailly for  possibleanginal symptoms. Has not yet noted any change in symptoms.  Thyroid issues followed by Dr. Loanne Drilling ENDO. Following up yearly.  Gout: good control on allopurinol. No flares in last year. Lab Results  Component Value Date   LABURIC 4.8 02/01/2018   Patient Care Team: Jinny Sanders, MD as PCP - General Birder Robson, MD as Referring Physician (Ophthalmology) Renato Shin, MD as Consulting Physician (Endocrinology) Nickie Retort, MD as Consulting Physician (Urology)   This visit occurred during the SARS-CoV-2 public health emergency.   Safety protocols were in place, including screening questions prior to the visit, additional usage of staff PPE, and extensive cleaning of exam room while observing appropriate contact time as indicated for disinfecting solutions.   COVID 19 screen:  No recent travel or known exposure to COVID19 The patient denies respiratory symptoms of COVID 19 at this time. The importance of social distancing was discussed today.     Review of Systems  Constitutional: Negative for chills and fever.  HENT: Negative for congestion and ear pain.   Eyes: Negative for pain and redness.  Respiratory: Negative for cough and shortness of breath.   Cardiovascular: Negative for chest pain, palpitations and leg swelling.  Gastrointestinal: Negative for abdominal pain, blood in stool, constipation, diarrhea, nausea and vomiting.  Genitourinary: Negative for dysuria.  Musculoskeletal: Negative for falls and myalgias.  Skin: Negative for rash.  Neurological: Negative for dizziness.  Psychiatric/Behavioral: Negative for depression. The patient is not nervous/anxious.       Past Medical History:  Diagnosis Date  . 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 COPD (chronic obstructive pulmonary disease) ([place])   . DIVERTICULOSIS, COLON 01/12/2007  . 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 GERD (gastroesophageal reflux disease)   . GOITER, MULTINODULAR 05/04/2010  . GOUT 01/12/2007  . Heart murmur    childhood only  . HYPERCHOLESTEROLEMIA 06/20/2007  . HYPERTENSION 01/12/2007  . HYPERTHYROIDISM 04/24/2010  . OBESITY 01/12/2007  . PROSTATE SPECIFIC ANTIGEN, ELEVATED 10/08/2009  . Renal calculi   . Shortness of breath dyspnea    with exertion    reports that he quit smoking about 31  years ago. He has a 60.00 pack-year smoking history. He has never used smokeless tobacco. He reports current alcohol use of about 5.0 standard drinks of alcohol per week. He reports that he does not use drugs.   Current  Outpatient Medications:  .  allopurinol (ZYLOPRIM) 300 MG tablet, TAKE 1 TABLET BY MOUTH ONCE DAILY, Disp: 90 tablet, Rfl: 3 .  amLODipine (NORVASC) 2.5 MG tablet, Take 1 tablet (2.5 mg total) by mouth daily., Disp: 90 tablet, Rfl: 1 .  aspirin 81 MG EC tablet, Take by mouth daily., Disp: , Rfl:  .  atorvastatin (LIPITOR) 20 MG tablet, Take 1 tablet (20 mg total) by mouth daily., Disp: 90 tablet, Rfl: 3 .  Calcium Carb-Cholecalciferol (CALCIUM 1000 + D PO), Take by mouth., Disp: , Rfl:  .  hydrochlorothiazide (HYDRODIURIL) 25 MG tablet, Take 1 tablet (25 mg total) by mouth daily., Disp: 90 tablet, Rfl: 3 .  levothyroxine (SYNTHROID) 137 MCG tablet, TAKE 1 TABLET BY MOUTH  DAILY BEFORE BREAKFAST, Disp: 90 tablet, Rfl: 3 .  mometasone (NASONEX) 50 MCG/ACT nasal spray, Place 2 sprays into the nose as needed., Disp: 17 g, Rfl: 0 .  montelukast (SINGULAIR) 10 MG tablet, Take 1 tablet (10 mg total) by mouth daily as needed. Only when flying, Disp: 30 tablet, Rfl: 11 .  polyethylene glycol (MIRALAX / GLYCOLAX) packet, Take 17 g by mouth daily as needed., Disp: , Rfl:  .  sildenafil (VIAGRA) 100 MG tablet, Take 1 tablet (100 mg total) by mouth daily as needed for erectile dysfunction., Disp: 10 tablet, Rfl: 0 .  terazosin (HYTRIN) 5 MG capsule, TAKE 1 CAPSULE BY MOUTH  ONCE DAILY, Disp: 90 capsule, Rfl: 3 .  ULTRAVATE 0.05 % LOTN, APPLY ON THE SKIN DAILY. AVOID FACE GROIN AND UNDERARMS, Disp: , Rfl:  .  umeclidinium-vilanterol (ANORO ELLIPTA) 62.5-25 MCG/INH AEPB, Inhale 1 puff into the lungs every morning., Disp: 1 each, Rfl: 3   Observations/Objective: Blood pressure 140/80, pulse (!) 45, temperature 97.9 F (36.6 C), temperature source Temporal, height 6' 0.5" (1.842 m), weight 259 lb 8 oz (117.7 kg), SpO2 99 %.  Physical Exam Constitutional:      General: He is not in acute distress.    Appearance: Normal appearance. He is well-developed. He is obese. He is not ill-appearing or toxic-appearing.   HENT:     Head: Normocephalic and atraumatic.     Right Ear: Hearing, tympanic membrane, ear canal and external ear normal.     Left Ear: Hearing, tympanic membrane, ear canal and external ear normal.     Nose: Nose normal.     Mouth/Throat:     Pharynx: Uvula midline.  Eyes:     General: Lids are normal. Lids are everted, no foreign bodies appreciated.     Conjunctiva/sclera: Conjunctivae normal.     Pupils: Pupils are equal, round, and reactive to light.  Neck:     Thyroid: No thyroid mass or thyromegaly.     Vascular: No carotid bruit.     Trachea: Trachea and phonation normal.  Cardiovascular:     Rate and Rhythm: Normal rate and regular rhythm.     Pulses: Normal pulses.     Heart sounds: S1 normal and S2 normal. No murmur. No gallop.   Pulmonary:     Breath sounds: Normal breath sounds. No wheezing, rhonchi or rales.  Abdominal:     General: Bowel sounds are normal.     Palpations: Abdomen is soft.  Tenderness: There is no abdominal tenderness. There is no guarding or rebound.     Hernia: No hernia is present.  Musculoskeletal:     Cervical back: Normal range of motion and neck supple.  Lymphadenopathy:     Cervical: No cervical adenopathy.  Skin:    General: Skin is warm and dry.     Findings: No rash.  Neurological:     Mental Status: He is alert.     Cranial Nerves: No cranial nerve deficit.     Sensory: No sensory deficit.     Gait: Gait normal.     Deep Tendon Reflexes: Reflexes are normal and symmetric.  Psychiatric:        Speech: Speech normal.        Behavior: Behavior normal.        Judgment: Judgment normal.      Assessment and Plan   The patient's preventative maintenance and recommended screening tests for an annual wellness exam were reviewed in full today. Brought up to date unless services declined.  Counselled on the importance of diet, exercise, and its role in overall health and mortality. The patient's FH and SH was reviewed,  including their home life, tobacco status, and drug and alcohol status.   Vaccines:uptodatenow Prostate Cancer Screen:per urology, Dr Sharin Grave     Colon Cancer Screen:Neg cologuard 06/10/2015,  No further cancer screening needed for colon after age 28.      Smoking Status:former smoker > 60 pack history notcandidate for CT screen as quit >15 years ago. Hearing screen failed on2017, [id number] slight decrease in hearing  Essential hypertension, benign Well controlled. Continue current medication.   COPD, mild  On anoro.. followed by D.r Patsey Berthold.  Aortic atherosclerosis (HCC)  Tight cholesterol control needed. LDL at goal.  Shortness of breath S/P cardiac work up by Dr. 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  Like due to diastolic dysfunction, obesity, mild COPD and mild sleep apnea. 12/16/2020Started on amlodipine 2.5 mg dailly for  possibleanginal symptoms.    Hypothyroidism following radioiodine therapy  Followed by ENDO Dr. Loanne Drilling.  HYPERCHOLESTEROLEMIA LDL at goal on statin. Goal < 70 given CAD.   Eliezer Lofts, MD

## 2019-02-22 ENCOUNTER — Telehealth: Payer: Self-pay | Admitting: Pulmonary Disease

## 2019-02-22 MED ORDER — ANORO ELLIPTA 62.5-25 MCG/INH IN AEPB: 1.0000 | INHALATION_SPRAY | Freq: Every morning | RESPIRATORY_TRACT | 3 refills | Status: DC

## 2019-02-22 NOTE — Telephone Encounter (Signed)
Rx for Anoro has been sent to preferred pharmacy. Pt is aware and voiced his understanding.  Nothing further is needed.

## 2019-03-03 ENCOUNTER — Ambulatory Visit: Payer: Medicare Other | Attending: Internal Medicine

## 2019-03-03 DIAGNOSIS — Z23 Encounter for immunization: Secondary | ICD-10-CM

## 2019-03-03 NOTE — Progress Notes (Signed)
   Covid-19 Vaccination Clinic  Name:  Jason Solomon    MRN: [id number] DOB: [birth year]  03/03/2019  Jason Solomon was observed post Covid-19 immunization for 15 minutes without incidence. He was provided with Vaccine Information Sheet and instruction to access the V-Safe system.   Jason Solomon was instructed to call 911 with any severe reactions post vaccine: Marland Kitchen Difficulty breathing  . Swelling of your face and throat  . A fast heartbeat  . A bad rash all over your body  . Dizziness and weakness    Immunizations Administered    Name Date Dose VIS Date Route   Pfizer COVID-19 Vaccine 03/03/2019 11:53 AM 0.3 mL 02/02/2019 Intramuscular   Manufacturer: [place]   Lot: [id number]   [place]: [id number]

## 2019-03-12 NOTE — Progress Notes (Signed)
Virtual Visit via Telephone Note   This visit type was conducted due to national recommendations for restrictions regarding the COVID-19 Pandemic (e.g. social distancing) in an effort to limit this patient's exposure and mitigate transmission in our community.  Due to his co-morbid illnesses, this patient is at least at moderate risk for complications without adequate follow up.  This format is felt to be most appropriate for this patient at this time.  The patient did not have access to video technology/had technical difficulties with video requiring transitioning to audio format only (telephone).  All issues noted in this document were discussed and addressed.  No physical exam could be performed with this format.  Please refer to the patient's chart for his  consent to telehealth for [facility].   Date:  03/15/2019   ID:  Jason Solomon, DOB 1940/09/27, MRN [id number]  Patient Location: Home Provider Location: Home  PCP:  Jinny Sanders, MD  Cardiologist:  Nelva Bush, MD Electrophysiologist:  None   Evaluation Performed:  Follow-Up Visit  Chief Complaint: Shortness of breath  History of Present Illness:    Jason Solomon is a 79 y.o. male with history of non-obstructive CAD by coronary CTA in 08/2018,COPD, hypertension, hyperlipidemia, GERD, obesity, and angioedema (suspected to be due to ACE inhibitor).  We are speaking today for follow-up of shortness of breath.  I last saw Jason Solomon in the office a month ago, at which time he reported stable exertional dyspnea.  He had put on weight over the preceding few months with resultant weight gain.  We agreed to start amlodipine 2.5 mg daily for antianginal therapy, in case his dyspnea was being driven by CAD (particularly microvascular dysfunction).  Today, Jason Solomon reports that he feels about the same as at our last visit.  He still has some exertional dyspnea though he pushes through this to perform his daily activities.   He does not feel like the amlodipine made much difference.  He has not had any side effects.  Mild leg edema is unchanged.  He denies chest pain, palpitations, and lightheadedness.  Home blood pressures are averaging 132/82.  He and his wife have started making some dietary changes.  He would like to start exercising more, though this has been somewhat difficult since moving into an apartment.  He received his first COVID-19 vaccine earlier this next month.  The patient does not have symptoms concerning for COVID-19 infection (fever, chills, cough, or new shortness of breath).    Past Medical History:  Diagnosis Date  . 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 COPD (chronic obstructive pulmonary disease) ([place])   . DIVERTICULOSIS, COLON 01/12/2007  . 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 GERD (gastroesophageal reflux disease)   . GOITER, MULTINODULAR 05/04/2010  . GOUT 01/12/2007  . Heart murmur    childhood only  . HYPERCHOLESTEROLEMIA 06/20/2007  . HYPERTENSION 01/12/2007  . HYPERTHYROIDISM 04/24/2010  . OBESITY 01/12/2007  . PROSTATE SPECIFIC ANTIGEN, ELEVATED 10/08/2009  . Renal calculi   . Shortness of breath dyspnea    with exertion   Past Surgical History:  Procedure Laterality Date  . CARDIOVASCULAR STRESS TEST  2006   negative  . HAND SURGERY  1949   left hand laceration  . HERNIA REPAIR  1990's   right and left inguinal hernia  . KNEE SURGERY  1990's  . TONSILLECTOMY  1945     Current Meds  Medication Sig  . allopurinol (ZYLOPRIM) 300 MG  tablet TAKE 1 TABLET BY MOUTH ONCE DAILY  . amLODipine (NORVASC) 2.5 MG tablet Take 1 tablet (2.5 mg total) by mouth daily.  Marland Kitchen aspirin 81 MG EC tablet Take by mouth daily.  Marland Kitchen atorvastatin (LIPITOR) 20 MG tablet Take 1 tablet (20 mg total) by mouth daily.  . Calcium Carb-Cholecalciferol (CALCIUM 1000 + D PO) Take by mouth.  . hydrochlorothiazide (HYDRODIURIL) 25 MG tablet Take 1 tablet (25 mg total) by mouth daily.  Marland Kitchen  levothyroxine (SYNTHROID) 137 MCG tablet TAKE 1 TABLET BY MOUTH  DAILY BEFORE BREAKFAST  . mometasone (NASONEX) 50 MCG/ACT nasal spray Place 2 sprays into the nose as needed.  . montelukast (SINGULAIR) 10 MG tablet Take 1 tablet (10 mg total) by mouth daily as needed. Only when flying  . polyethylene glycol (MIRALAX / GLYCOLAX) packet Take 17 g by mouth daily as needed.  . sildenafil (VIAGRA) 100 MG tablet Take 1 tablet (100 mg total) by mouth daily as needed for erectile dysfunction.  Marland Kitchen terazosin (HYTRIN) 5 MG capsule TAKE 1 CAPSULE BY MOUTH  ONCE DAILY  . ULTRAVATE 0.05 % LOTN APPLY ON THE SKIN DAILY. AVOID FACE GROIN AND UNDERARMS  . umeclidinium-vilanterol (ANORO ELLIPTA) 62.5-25 MCG/INH AEPB Inhale 1 puff into the lungs every morning.     Allergies:   Ace inhibitors and Angiotensin receptor blockers   Social History   Tobacco Use  . Smoking status: Former Smoker    Packs/day: 2.00    Years: 30.00    Pack years: 60.00    Quit date: 1990    Years since quitting: 31.0  . Smokeless tobacco: Never Used  Substance Use Topics  . Alcohol use: Yes    Alcohol/week: 5.0 standard drinks    Types: 5 Glasses of wine per week  . Drug use: No     Family Hx: The patient's family history includes COPD in his brother; Cancer in his maternal grandmother; Hypertension in his father and mother; Lung cancer (age of onset: 35) in his brother; Stroke in his father; Throat cancer in his brother. There is no history of Thyroid disease.  ROS:   Please see the history of present illness.   All other systems reviewed and are negative.   Prior CV studies:   The following studies were reviewed today:  Non-Invasive Evaluation(s):  Cardiac CTA (09/01/2018): LMCA normal. LAD with 50-69% proximal and mid vessel disease. LCx with 60-69% stenosis in midvessel. Dominant RCA with diffuse 25-49% stenosis in the proximal and mid vessel. Distal RCA with 50-69% calcified plaque. CTFFR is not significant in any  vessel. Coronary calcium score 1,093 Agatston units (77 percentile for age/sex). Aortic atherosclerosis noted.  TTE (11/18/2017): Normal LV size with mild LVH. LVEF 55-60% with normal wall motion and grade 1 diastolic dysfunction. Mild MR. Mild biatrial enlargement. Normal RV size and function. Normal PA pressure.  Pharmacologic myocardial perfusion stress test (10/31/2017): Intermediate risk study with fixed defect involving the inferior, inferolateral, and apical segments thought to be artifact versus scar. No ischemia was seen. LVEF 41%.  Exercise stress echo (03/19/2015): Normal study without ischemia. Frequent PAC's and PVC's noted at rest and during stress.  Labs/Other Tests and Data Reviewed:    EKG:  No ECG reviewed.  Recent Labs: 02/08/2019: ALT 13; BUN 20; Creatinine, Ser 1.10; Potassium 4.0; Sodium 143   Recent Lipid Panel Lab Results  Component Value Date/Time   CHOL 129 02/08/2019 09:12 AM   TRIG 84.0 02/08/2019 09:12 AM   HDL 52.00 02/08/2019 09:12  AM   CHOLHDL 2 02/08/2019 09:12 AM   LDLCALC 60 02/08/2019 09:12 AM    Wt Readings from Last 3 Encounters:  03/15/19 260 lb (117.9 kg)  02/20/19 259 lb 8 oz (117.7 kg)  02/08/19 263 lb (119.3 kg)     Objective:    Vital Signs:  BP 126/79 (BP Location: Left Arm, Patient Position: Sitting, Cuff Size: Normal)   Pulse (!) 59   Ht 6\' 1"  (1.854 m)   Wt 260 lb (117.9 kg)   BMI 34.30 kg/m    VITAL SIGNS:  reviewed  ASSESSMENT & PLAN:    Nonobstructive coronary artery disease and dyspnea on exertion: Exertional dyspnea is unchanged with class II symptoms.  We have agreed to defer further testing or medication changes at this time.  Given upper normal blood pressures at home, I think it would be best to continue low-dose amlodipine as well as other medications.  I encouraged Mr. Newman to work on weight loss and exercise to help improve his symptoms.  Hypertension: Blood pressure upper normal.  Continue current  regimen.  Hyperlipidemia: LDL well controlled on last check in 12/20 at 60.  Continue atorvastatin 20 mg daily.  COVID-19 Education: The signs and symptoms of COVID-19 were discussed with the patient and how to seek care for testing (follow up with PCP or arrange E-visit).  The importance of social distancing was discussed today.  Time:   Today, I have spent 10 minutes with the patient with telehealth technology discussing the above problems.     Medication Adjustments/Labs and Tests Ordered: Current medicines are reviewed at length with the patient today.  Concerns regarding medicines are outlined above.   Tests Ordered: None.  Medication Changes: None  Follow Up:  In Person in 6 month(s)  Signed, Nelva Bush, MD  03/15/2019 8:24 AM    [place]

## 2019-03-15 ENCOUNTER — Telehealth (INDEPENDENT_AMBULATORY_CARE_PROVIDER_SITE_OTHER): Payer: Medicare Other | Admitting: Internal Medicine

## 2019-03-15 ENCOUNTER — Other Ambulatory Visit: Payer: Self-pay

## 2019-03-15 ENCOUNTER — Encounter: Payer: Self-pay | Admitting: Internal Medicine

## 2019-03-15 VITALS — BP 126/79 | HR 59 | Ht 73.0 in | Wt 260.0 lb

## 2019-03-15 DIAGNOSIS — E785 Hyperlipidemia, unspecified: Secondary | ICD-10-CM | POA: Diagnosis not present

## 2019-03-15 DIAGNOSIS — I2510 Atherosclerotic heart disease of native coronary artery without angina pectoris: Secondary | ICD-10-CM | POA: Diagnosis not present

## 2019-03-15 DIAGNOSIS — R0609 Other forms of dyspnea: Secondary | ICD-10-CM

## 2019-03-15 DIAGNOSIS — I10 Essential (primary) hypertension: Secondary | ICD-10-CM

## 2019-03-15 DIAGNOSIS — R0600 Dyspnea, unspecified: Secondary | ICD-10-CM | POA: Diagnosis not present

## 2019-03-15 NOTE — Patient Instructions (Signed)
Medication Instructions:  Your physician recommends that you continue on your current medications as directed. Please refer to the Current Medication list given to you today.  *If you need a refill on your cardiac medications before your next appointment, please call your pharmacy*  Lab Work: none If you have labs (blood work) drawn today and your tests are completely normal, you will receive your results only by: . MyChart Message (if you have MyChart) OR . A paper copy in the mail If you have any lab test that is abnormal or we need to change your treatment, we will call you to review the results.  Testing/Procedures: none  Follow-Up: At [facility], you and your health needs are our priority.  As part of our continuing mission to provide you with exceptional heart care, we have created designated Provider Care Teams.  These Care Teams include your primary Cardiologist (physician) and Advanced Practice Providers (APPs -  Physician Assistants and Nurse Practitioners) who all work together to provide you with the care you need, when you need it.  Your next appointment:   6 months  The format for your next appointment:   In Person  Provider:    You may see DR CHRISTOPHER END or one of the following Advanced Practice Providers on your designated Care Team:    Christopher Berge, NP  Ryan Dunn, PA-C  Jacquelyn Visser, PA-C  

## 2019-03-22 ENCOUNTER — Ambulatory Visit: Payer: Medicare Other

## 2019-03-24 ENCOUNTER — Ambulatory Visit: Payer: Medicare Other | Attending: Internal Medicine

## 2019-03-24 DIAGNOSIS — Z23 Encounter for immunization: Secondary | ICD-10-CM

## 2019-03-24 NOTE — Progress Notes (Signed)
   Covid-19 Vaccination Clinic  Name:  [name]    MRN: [id number] DOB: [birth year]  03/24/2019  Mr. Westhoff was observed post Covid-19 immunization for 15 minutes without incidence. He was provided with Vaccine Information Sheet and instruction to access the V-Safe system.   Mr. Shepard was instructed to call 911 with any severe reactions post vaccine: Marland Kitchen Difficulty breathing  . Swelling of your face and throat  . A fast heartbeat  . A bad rash all over your body  . Dizziness and weakness    Immunizations Administered    Name Date Dose VIS Date Route   Pfizer COVID-19 Vaccine 03/24/2019  8:18 AM 0.3 mL 02/02/2019 Intramuscular   Manufacturer: [place]   Lot: [id number]   [place]: [id number]

## 2019-04-16 ENCOUNTER — Other Ambulatory Visit: Payer: Self-pay | Admitting: Family Medicine

## 2019-04-17 NOTE — Telephone Encounter (Signed)
I don't see [facility] records in last chart.. can you request or find in chart so I can answer his mychart question.

## 2019-04-18 ENCOUNTER — Other Ambulatory Visit: Payer: Self-pay | Admitting: Endocrinology

## 2019-04-18 NOTE — Telephone Encounter (Signed)
Look under Media on 05/12/2018.  There is a scanned office note from [facility].

## 2019-06-07 ENCOUNTER — Other Ambulatory Visit: Payer: Self-pay | Admitting: Dermatology

## 2019-06-07 ENCOUNTER — Other Ambulatory Visit: Payer: Self-pay

## 2019-06-11 ENCOUNTER — Ambulatory Visit (INDEPENDENT_AMBULATORY_CARE_PROVIDER_SITE_OTHER): Payer: Medicare Other | Admitting: Endocrinology

## 2019-06-11 ENCOUNTER — Encounter: Payer: Self-pay | Admitting: Endocrinology

## 2019-06-11 ENCOUNTER — Other Ambulatory Visit: Payer: Self-pay

## 2019-06-11 VITALS — BP 116/70 | HR 63 | Ht 73.0 in | Wt 266.0 lb

## 2019-06-11 DIAGNOSIS — I2510 Atherosclerotic heart disease of native coronary artery without angina pectoris: Secondary | ICD-10-CM

## 2019-06-11 DIAGNOSIS — E042 Nontoxic multinodular goiter: Secondary | ICD-10-CM | POA: Diagnosis not present

## 2019-06-11 LAB — TSH: TSH: 1.53 u[IU]/mL (ref 0.35–4.50)

## 2019-06-11 LAB — T4, FREE: Free T4: 0.96 ng/dL (ref 0.60–1.60)

## 2019-06-11 NOTE — Progress Notes (Signed)
Subjective:    Patient ID: Jason Solomon, male    DOB: 1940-09-17, 79 y.o.   MRN: [id number]  HPI Pt returns for f/u of post-RAI hypothyroidism  (he had RAI for hyperthyroidism, due to small multinodular goiter, in late 2012; he has since had a fluctuating dosage requirement for synthroid; F/U US in 2014 showed significant shrinkage of the goiter).  pt states he feels well in general.   Past Medical History:  Diagnosis Date  . 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 COPD (chronic obstructive pulmonary disease) ([place])   . DIVERTICULOSIS, COLON 01/12/2007  . 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 GERD (gastroesophageal reflux disease)   . GOITER, MULTINODULAR 05/04/2010  . GOUT 01/12/2007  . Heart murmur    childhood only  . HYPERCHOLESTEROLEMIA 06/20/2007  . HYPERTENSION 01/12/2007  . HYPERTHYROIDISM 04/24/2010  . OBESITY 01/12/2007  . PROSTATE SPECIFIC ANTIGEN, ELEVATED 10/08/2009  . Renal calculi   . Shortness of breath dyspnea    with exertion    Past Surgical History:  Procedure Laterality Date  . CARDIOVASCULAR STRESS TEST  2006   negative  . HAND SURGERY  1949   left hand laceration  . HERNIA REPAIR  1990's   right and left inguinal hernia  . KNEE SURGERY  1990's  . TONSILLECTOMY  1945    Social History   Socioeconomic History  . Marital status: Married    Spouse name: Not on file  . Number of children: 4  . Years of education: Not on file  . Highest education level: Not on file  Occupational History    Employer: RETIRED    Comment: Retired  Tobacco Use  . Smoking status: Former Smoker    Packs/day: 2.00    Years: 30.00    Pack years: 60.00    Quit date: 1990    Years since quitting: 31.3  . Smokeless tobacco: Never Used  Substance and Sexual Activity  . Alcohol use: Yes    Alcohol/week: 5.0 standard drinks    Types: 5 Glasses of wine per week  . Drug use: No  . Sexual activity: Never  Other Topics Concern  . Not on file  Social  History Narrative   Regular exercise-yes, walks , golf   Occupation: Retired Hydrographic surveyor   Diet: (+) fruit, (+) veggies, (+) salad, (+) H20   Children: 4, healthy   Married x 43 years   Living will, [place], daughter. Full code. (reviewed 2013)   Social Determinants of Health   Financial Resource Strain: Low Risk   . Difficulty of Paying Living Expenses: Not hard at all  Food Insecurity: No Food Insecurity  . Worried About Charity fundraiser in the Last Year: Never true  . Ran Out of Food in the Last Year: Never true  Transportation Needs: No Transportation Needs  . Lack of Transportation (Medical): No  . Lack of Transportation (Non-Medical): No  Physical Activity: Inactive  . Days of Exercise per Week: 0 days  . Minutes of Exercise per Session: 0 min  Stress: No Stress Concern Present  . Feeling of Stress : Not at all  Social Connections:   . Frequency of Communication with Friends and Family:   . Frequency of Social Gatherings with Friends and Family:   . Attends Religious Services:   . Active Member of Clubs or Organizations:   . Attends Archivist Meetings:   Marland Kitchen Marital Status:  Intimate Partner Violence: Not At Risk  . Fear of Current or Ex-Partner: No  . Emotionally Abused: No  . Physically Abused: No  . Sexually Abused: No    Current Outpatient Medications on File Prior to Visit  Medication Sig Dispense Refill  . allopurinol (ZYLOPRIM) 300 MG tablet TAKE 1 TABLET BY MOUTH ONCE DAILY 90 tablet 3  . ANORO ELLIPTA 62.5-25 MCG/INH AEPB INHALE 1 PUFF BY MOUTH IN THE MORNING 60 each 1  . aspirin 81 MG EC tablet Take by mouth daily.    Marland Kitchen atorvastatin (LIPITOR) 20 MG tablet TAKE 1 TABLET BY MOUTH  DAILY 90 tablet 3  . Calcium Carb-Cholecalciferol (CALCIUM 1000 + D PO) Take by mouth.    . hydrochlorothiazide (HYDRODIURIL) 25 MG tablet Take 1 tablet (25 mg total) by mouth daily. 90 tablet 3  . levothyroxine (SYNTHROID) 137 MCG tablet  TAKE 1 TABLET BY MOUTH  DAILY BEFORE BREAKFAST 90 tablet 3  . mometasone (NASONEX) 50 MCG/ACT nasal spray Place 2 sprays into the nose as needed. 17 g 0  . montelukast (SINGULAIR) 10 MG tablet Take 1 tablet (10 mg total) by mouth daily as needed. Only when flying 30 tablet 11  . polyethylene glycol (MIRALAX / GLYCOLAX) packet Take 17 g by mouth daily as needed.    . sildenafil (VIAGRA) 100 MG tablet Take 1 tablet (100 mg total) by mouth daily as needed for erectile dysfunction. 10 tablet 0  . terazosin (HYTRIN) 5 MG capsule TAKE 1 CAPSULE BY MOUTH  ONCE DAILY 90 capsule 3  . ULTRAVATE 0.05 % LOTN APPLY ON THE SKIN DAILY. AVOID FACE, GROIN, AND UNDERARMS 60 mL 0  . umeclidinium-vilanterol (ANORO ELLIPTA) 62.5-25 MCG/INH AEPB Inhale 1 puff into the lungs every morning. 1 each 3  . amLODipine (NORVASC) 2.5 MG tablet Take 1 tablet (2.5 mg total) by mouth daily. 90 tablet 1   No current facility-administered medications on file prior to visit.    Allergies  Allergen Reactions  . Ace Inhibitors     REACTION: ?angioedema  . Angiotensin Receptor Blockers     REACTION: angioedema on ACE?    Family History  Problem Relation Age of Onset  . Hypertension Mother   . COPD Brother   . Lung cancer Brother 4  . Throat cancer Brother   . Hypertension Father   . Stroke Father   . Cancer Maternal Grandmother        unknown  . Thyroid disease Neg Hx     BP 116/70   Pulse 63   Ht 6\' 1"  (1.854 m)   Wt 266 lb (120.7 kg)   SpO2 95%   BMI 35.09 kg/m    Review of Systems Denies neck swelling.      Objective:   Physical Exam VITAL SIGNS:  See vs page.  GENERAL: no distress.  NECK: There is no palpable thyroid enlargement.  No thyroid nodule is palpable.  No palpable lymphadenopathy at the anterior neck.     Lab Results  Component Value Date   TSH 1.53 06/11/2019      Assessment & Plan:  Hypothyroidism: well-replaced.  Please continue the same medication Please come back for a  follow-up appointment in 1 year

## 2019-06-11 NOTE — Patient Instructions (Signed)
Blood tests are requested for you today.  We'll let you know about the results.  It is best to never miss the medication.  However, if you do miss it, next best is to double up the next time.   Please come back for a follow-up appointment in 1 year.

## 2019-06-13 ENCOUNTER — Other Ambulatory Visit: Payer: Self-pay

## 2019-06-13 ENCOUNTER — Ambulatory Visit (INDEPENDENT_AMBULATORY_CARE_PROVIDER_SITE_OTHER): Payer: Medicare Other | Admitting: Pulmonary Disease

## 2019-06-13 ENCOUNTER — Encounter: Payer: Self-pay | Admitting: Pulmonary Disease

## 2019-06-13 VITALS — BP 132/80 | HR 55 | Temp 97.3°F | Ht 74.0 in | Wt 263.8 lb

## 2019-06-13 DIAGNOSIS — R0609 Other forms of dyspnea: Secondary | ICD-10-CM

## 2019-06-13 DIAGNOSIS — J449 Chronic obstructive pulmonary disease, unspecified: Secondary | ICD-10-CM | POA: Diagnosis not present

## 2019-06-13 DIAGNOSIS — R0600 Dyspnea, unspecified: Secondary | ICD-10-CM

## 2019-06-13 MED ORDER — ANORO ELLIPTA 62.5-25 MCG/INH IN AEPB: 1.0000 | INHALATION_SPRAY | Freq: Every morning | RESPIRATORY_TRACT | 3 refills | Status: DC

## 2019-06-13 NOTE — Patient Instructions (Signed)
We are going to fill your prescription of Anoro and [facility].  We will see you in follow-up in 6 months time call sooner should any new difficulties arise.

## 2019-06-13 NOTE — Progress Notes (Signed)
Subjective:    Patient ID: Jason Solomon, male    DOB: 1941/02/08, 79 y.o.   MRN: [id number]  HPI 79 year old smoker (quit 1990) follows here for the issue of dyspnea on exertion.  Initial visit was on 11 October 2018.  He is currently on Anoro Ellipta.  He has mild obstructive sleep apnea which he states has been better since he avoids the supine position as recommended by sleep study.  He states his wife hardly notices any snoring.  He does have issues of nocturia x1 every night.  No chest pain.  No orthopnea or paroxysmal nocturnal dyspnea.  He is compliant with Anoro, PFTs had shown objective improvement on lung function on Anoro.  He does not perceive significant change.  He continues to have significant truncal obesity.  He has had decreased activity due to restrictions, currently living in an apartment as his new house is being built.  He has not been able to work outside as he normally does.  Patient does not endorse any cough, sputum production or hemoptysis.  Echocardiogram showed diastolic dysfunction type I.  No pulmonary hypertension and normal LV function.  Laboratory data: 12/07/2018: Echocardiogram EF 55 to 60%.  Diastolic dysfunction grade 1.  No pulmonary hypertension 12/07/2018: PFTs: FEV1 2.80 L or 87% predicted, FVC 4.56 L or 105% of predicted.  No bronchodilator response.  Lung volumes normal except ERV which was 9%, consistent with obesity.  Diffusion capacity moderately reduced and corrects for alveolar volume.   Review of Systems A 10 point review of systems was performed and it is as noted above otherwise negative.    Objective:   Physical Exam GENERAL: Obese gentleman, no acute distress.  Awake, alert fully ambulatory. HEAD: Normocephalic, atraumatic.  EYES: Pupils equal, round, reactive to light.  No scleral icterus.  MOUTH: Nose/mouth/throat not examined due to masking requirements for COVID 19. NECK: Supple. No thyromegaly. No nodules. No JVD.  Trachea  midline. PULMONARY: Lungs clear to auscultation bilaterally.  Good air entry bilaterally. CARDIOVASCULAR: S1 and S2. Regular rate and rhythm.  GASTROINTESTINAL: Truncal obesity, nondistended. MUSCULOSKELETAL: No joint deformity, no clubbing, no edema.  NEUROLOGIC: No overt focal deficits.  Awake and alert. SKIN: Intact,warm,dry.  Limited exam, no rashes. PSYCH: Mood and behavior normal.      Assessment & Plan:  1. Dyspnea (shortness of breath) on exertion:I cannot ascribe this entirely to pulmonary etiology.   COPD is very mild by PFTs as noted above.  He does have issues with diastolic dysfunction, no evidence of right-sided dysfunction.  I believe that his dyspnea has likely aggravated by obesity, he has significant truncal obesity and his ERV is only 9% on PFTs.  This is indicative of obesity.  I have recommended weight loss and to restart a conditioning program as he appears to have benefited from this previously.  It appears that he is getting more active and hopefully this will help.  It may also help to address dysfunction better.  It may be that perhaps diltiazem may work better than amlodipine in this regard.  I defer this decision this to primary care and/or cardiology.  2. Snoring: Very mild sleep apnea only in the supine position.  He has been compliant with sleeping mostly on the side.  He states that wife notes that he does not snore and sleeps better  3. COPD GOLD class I: Continue Anoro Ellipta, 1 inhalation daily. He has shown improvement on his pulmonary function from prior.  We [facility].  4. Grade 1 diastolic dysfunction: This issue adds complexity to his management, suspect that this adds to his issues with dyspnea noted above.    5. Obesity BMI of 34: Patient has significant truncal obesity.  ERV is 9% is indicative of obesity..  Suspect that this enhances his sensation of dyspnea as noted above.  Recommendations are for weight loss and  conditioning program.  We will see the patient in follow-up in 6 months time call sooner should any new difficulties arise.   Renold Don, MD [facility]  *This note was dictated using voice recognition software/Dragon.  Despite best efforts to proofread, errors can occur which can change the meaning.  Any change was purely unintentional.

## 2019-07-30 ENCOUNTER — Other Ambulatory Visit: Payer: Self-pay | Admitting: Internal Medicine

## 2019-07-30 MED ORDER — AMLODIPINE BESYLATE 2.5 MG PO TABS: 2.5000 mg | ORAL_TABLET | Freq: Every day | ORAL | 0 refills | Status: DC

## 2019-07-30 NOTE — Telephone Encounter (Signed)
*  STAT* If patient is at the pharmacy, call can be transferred to refill team.   1. Which medications need to be refilled? (please list name of each medication and dose if known) amlodipine 2.5 mg daily  2. Which pharmacy/location (including street and city if local pharmacy) is medication to be sent to? [facility]  3. Do they need a 30 day or 90 day supply? [place]

## 2019-07-30 NOTE — Telephone Encounter (Signed)
Requested Prescriptions   Signed Prescriptions Disp Refills   amLODipine (NORVASC) 2.5 MG tablet 90 tablet 0    Sig: Take 1 tablet (2.5 mg total) by mouth daily.    Authorizing Provider: END, CHRISTOPHER    Ordering User: Raelene Bott, Kaoir Loree L

## 2019-07-31 IMAGING — CT CT CORONARY FRACTIONAL FLOW RESERVE
1 of 13 series · 6 of 20 positions shown, 8 images · non-contrast
Comparison: none

EXAM:
CT FFR ANALYSIS
CLINICAL DATA: 77-year-old male with chest pain.

[Series 17: multiphase cta coronary · axial · 0.40mm/px · z∈[-1308,-1215]mm · 6 of 3260 slices shown, 8 images]
[im 466/3260  vessel]
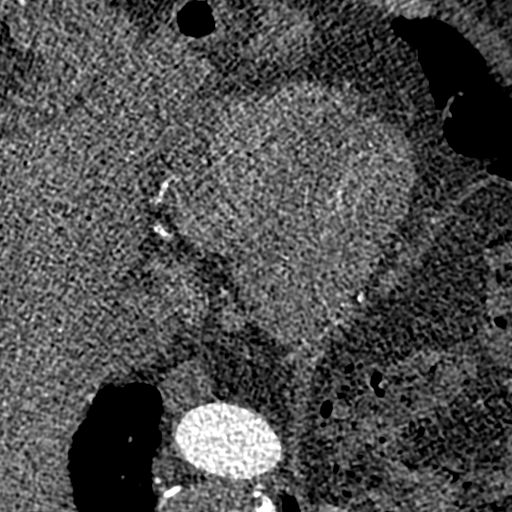
[im 466/3260  lung]
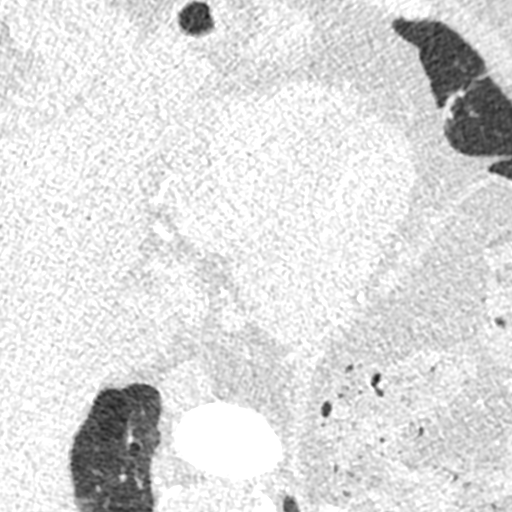
[im 932/3260  vessel]
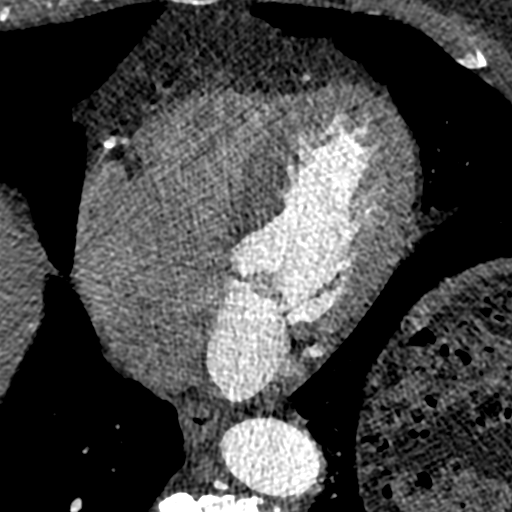
[im 1397/3260  vessel]
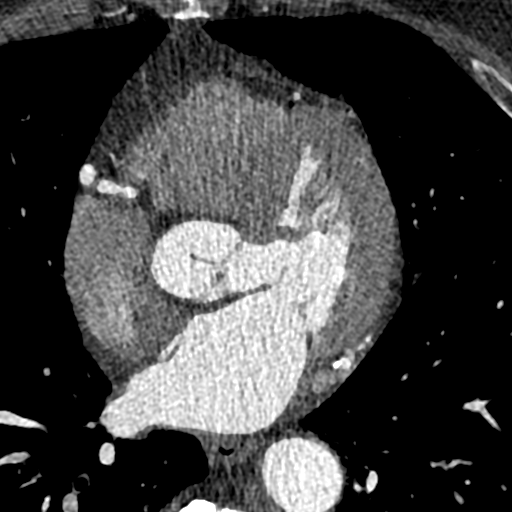
[im 1863/3260  vessel]
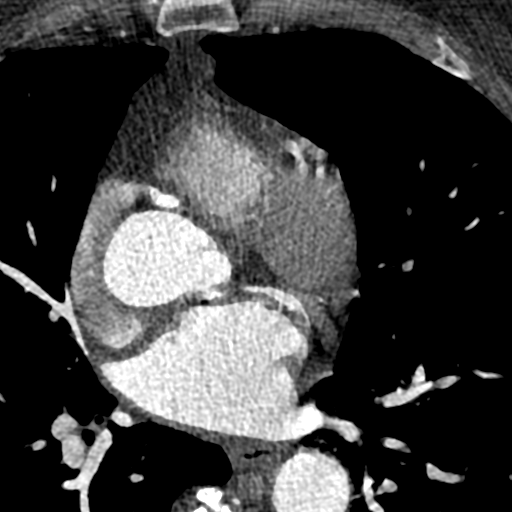
[im 2328/3260  vessel]
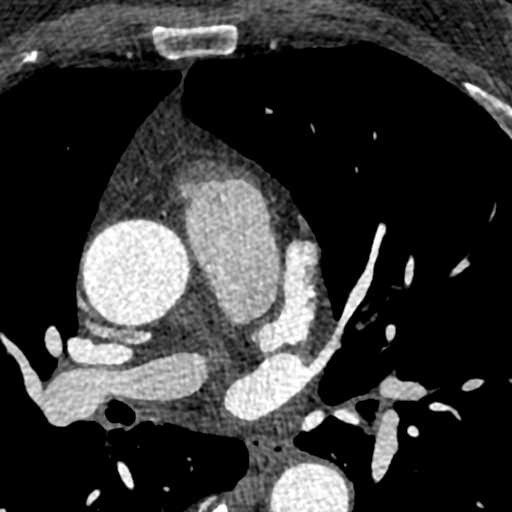
[im 2328/3260  lung]
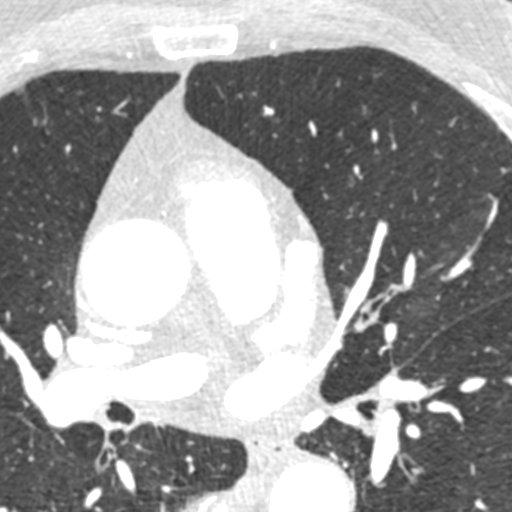
[im 2794/3260  vessel]
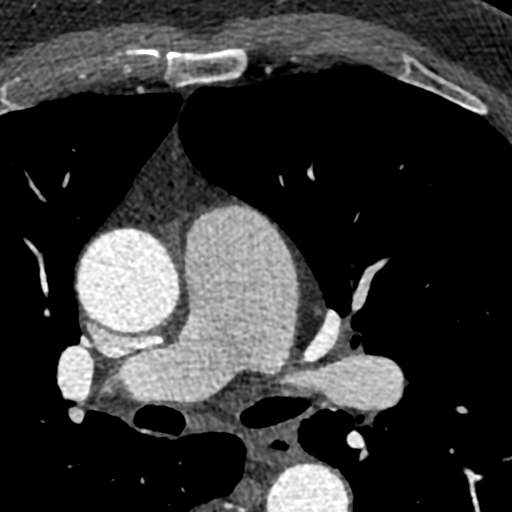

[6 of 20 positions shown; findings below may reference images not displayed]

FINDINGS: FFRct analysis was performed on the original cardiac CT angiogram
dataset. Diagrammatic representation of the FFRct analysis is
provided in a separate PDF document in PACS. This dictation was
created using the PDF document and an interactive 3D model of the
results. 3D model is not available in the EMR/PACS. Normal FFR range
is >0.80.

1. Left Main: 1.0.

2. LAD: Proximal: 0.94, distal: 0.90.
3. D1: 0.89.
4. LCX: Proximal: 0.97, distal: 0.90.
5. RCA: Proximal: 0.99, distal: 0.96.
IMPRESSION: 1. CT FFR analysis didn't show any significant stenosis. Aggressive
medical management is recommended.

## 2019-08-28 ENCOUNTER — Other Ambulatory Visit: Payer: Self-pay | Admitting: Family Medicine

## 2019-08-28 ENCOUNTER — Other Ambulatory Visit: Payer: Self-pay | Admitting: Internal Medicine

## 2019-09-25 NOTE — Progress Notes (Signed)
Follow-up Outpatient Visit Date: 09/26/2019  Primary Care Provider: Jinny Sanders, MD [place] [address]  Chief Complaint: Follow-up CAD and hypertension  HPI:  [name] is a 79 y.o. male with history of non-obstructive CAD by coronary CTA in 08/2018,COPD, hypertension, hyperlipidemia, GERD, obesity, and angioedema (suspected to be due to ACE inhibitor), who presents for follow-up of shortness of breath and coronary artery disease.  We last spoke in January, at which time [name] complained of stable exertional dyspnea.  He did not experience any symptomatic improvement with addition of amlodipine.  No medication changes or additional testing were pursued at that time.  Today, [name] reports that he feels about the same as when we last spoke with stable exertional dyspnea.  He continues to play golf and walk without limitations.  He notices occasional dependent edema in his ankles, right greater than left.  He denies chest pain and lightheadedness.  He checks his blood pressure 3 times a week and notes that is typically in the 130s over 70s.  He is currently living with his son and attributes changes in his diet to inability to lose much weight.  He expects to move into his new home next month.  --------------------------------------------------------------------------------------------------  Cardiovascular History & Procedures: Cardiovascular Problems:  Shortness of breath  Risk Factors:  Coronary artery calcification  Cath/PCI:  None  CV Surgery:  None  EP Procedures and Devices:  None  Non-Invasive Evaluation(s):  Cardiac CTA (09/01/2018): LMCA normal. LAD with 50-69% proximal and mid vessel disease. LCx with 60-69% stenosis in midvessel. Dominant RCA with diffuse 25-49% stenosis in the proximal and mid vessel. Distal RCA with 50-69% calcified plaque. CTFFR is not significant in any vessel. Coronary calcium score 1,093 Agatston  units (77 percentile for age/sex). Aortic atherosclerosis noted.  TTE (11/18/2017): Normal LV size with mild LVH. LVEF 55-60% with normal wall motion and grade 1 diastolic dysfunction. Mild MR. Mild biatrial enlargement. Normal RV size and function. Normal PA pressure.  Pharmacologic myocardial perfusion stress test (10/31/2017): Intermediate risk study with fixed defect involving the inferior, inferolateral, and apical segments thought to be artifact versus scar. No ischemia was seen. LVEF 41%.  Exercise stress echo (03/19/2015): Normal study without ischemia. Frequent PAC's and PVC's noted at rest and during stress.  Recent CV Pertinent Labs: Lab Results  Component Value Date   CHOL 129 02/08/2019   HDL 52.00 02/08/2019   LDLCALC 60 02/08/2019   TRIG 84.0 02/08/2019   CHOLHDL 2 02/08/2019   K 4.0 02/08/2019   MG 2.0 01/16/2014   BUN 20 02/08/2019   CREATININE 1.10 02/08/2019    Past medical and surgical history were reviewed and updated in EPIC.  Current Meds  Medication Sig   allopurinol (ZYLOPRIM) 300 MG tablet TAKE 1 TABLET BY MOUTH  DAILY   amLODipine (NORVASC) 2.5 MG tablet TAKE 1 TABLET BY MOUTH  DAILY   aspirin 81 MG EC tablet Take by mouth daily.   atorvastatin (LIPITOR) 20 MG tablet TAKE 1 TABLET BY MOUTH  DAILY   Calcium Carb-Cholecalciferol (CALCIUM 1000 + D PO) Take by mouth.   hydrochlorothiazide (HYDRODIURIL) 25 MG tablet Take 1 tablet (25 mg total) by mouth daily.   levothyroxine (SYNTHROID) 137 MCG tablet TAKE 1 TABLET BY MOUTH  DAILY BEFORE BREAKFAST   mometasone (NASONEX) 50 MCG/ACT nasal spray Place 2 sprays into the nose as needed.   montelukast (SINGULAIR) 10 MG tablet Take 1 tablet (10 mg total) by  mouth daily as needed. Only when flying   polyethylene glycol (MIRALAX / GLYCOLAX) packet Take 17 g by mouth daily as needed.   terazosin (HYTRIN) 5 MG capsule TAKE 1 CAPSULE BY MOUTH  DAILY   ULTRAVATE 0.05 % LOTN APPLY ON THE SKIN DAILY. AVOID  FACE, GROIN, AND UNDERARMS (Patient taking differently: as needed. )   umeclidinium-vilanterol (ANORO ELLIPTA) 62.5-25 MCG/INH AEPB Inhale 1 puff into the lungs every morning.    Allergies: Ace inhibitors and Angiotensin receptor blockers  Social History   Tobacco Use   Smoking status: Former Smoker    Packs/day: 2.00    Years: 30.00    Pack years: 60.00    Quit date: 1990    Years since quitting: 31.6   Smokeless tobacco: Never Used  Scientific laboratory technician Use: Never used  Substance Use Topics   Alcohol use: Yes    Alcohol/week: 5.0 standard drinks    Types: 5 Glasses of wine per week   Drug use: No    Family History  Problem Relation Age of Onset   Hypertension Mother    COPD Brother    Lung cancer Brother 30   Throat cancer Brother    Hypertension Father    Stroke Father    Cancer Maternal Grandmother        unknown   Thyroid disease Neg Hx     Review of Systems: A 12-system review of systems was performed and was negative except as noted in the HPI.  --------------------------------------------------------------------------------------------------  Physical Exam: BP (!) 146/90 (BP Location: Left Arm, Patient Position: Sitting, Cuff Size: Normal)    Pulse (!) 51    Ht 6\' 2"  (1.88 m)    Wt 262 lb 12.8 oz (119.2 kg)    SpO2 92%    BMI 33.74 kg/m  Repeat BP 152/90  General: NAD. Neck: No JVD or HJR. Lungs: Clear to auscultation without wheezes or crackles. Heart: Bradycardic but regular with 1/6 systolic murmur.  No rubs or gallops. Abdomen: Soft, nontender, nondistended. Extremities: No lower extremity edema.  EKG: Sinus bradycardia with first-degree AV block (PR interval 238 ms) and left axis deviation.  Compared with prior tracing from 02/07/2019, PVCs and PACs are no longer present.  Heart rate has also decreased.  Lab Results  Component Value Date   WBC 3.8 (L) 10/07/2017   HGB 13.9 10/07/2017   HCT 42.5 10/07/2017   MCV 92.6 10/07/2017    PLT 181.0 10/07/2017    Lab Results  Component Value Date   NA 143 02/08/2019   K 4.0 02/08/2019   CL 105 02/08/2019   CO2 33 (H) 02/08/2019   BUN 20 02/08/2019   CREATININE 1.10 02/08/2019   GLUCOSE 100 (H) 02/08/2019   ALT 13 02/08/2019    Lab Results  Component Value Date   CHOL 129 02/08/2019   HDL 52.00 02/08/2019   LDLCALC 60 02/08/2019   TRIG 84.0 02/08/2019   CHOLHDL 2 02/08/2019    --------------------------------------------------------------------------------------------------  ASSESSMENT AND PLAN: Coronary artery disease: Mr. Anastasi has stable exertional dyspnea but otherwise no symptoms to suggest worsening coronary insufficiency.  He was noted to have moderate multivessel CAD with extensive calcification but no flow-limiting disease by cardiac CTA a year ago.  We have agreed to continue with medical therapy to prevent progression of disease, including aspirin and statin therapy.  LDL at goal on last check in December (60).  Sinus bradycardia and first-degree AV block: Asymptomatic.  Patient is not on any  rate controlling or AV nodal blocking agents.  Most recent thyroid function studies in April were normal.  No further intervention at this time.  Hypertension: Blood pressure suboptimally controlled today but typically better at home.  We have discussed escalation of amlodipine, but Mr. Frontera wishes to defer this.  I have stressed the importance of sodium restriction as well as weight loss through diet and exercise.  Mr. Yeatts was provided with information regarding the DASH diet.  He should continue to monitor his blood pressure at home for target reading less than 140/90.  Hyperlipidemia: LDL at goal.  Continue atorvastatin 20 mg daily.  Follow-up: Return to clinic in 6 months.  Nelva Bush, MD 09/26/2019 8:14 AM

## 2019-09-26 ENCOUNTER — Encounter: Payer: Self-pay | Admitting: Internal Medicine

## 2019-09-26 ENCOUNTER — Other Ambulatory Visit: Payer: Self-pay

## 2019-09-26 ENCOUNTER — Ambulatory Visit (INDEPENDENT_AMBULATORY_CARE_PROVIDER_SITE_OTHER): Payer: Medicare Other | Admitting: Internal Medicine

## 2019-09-26 VITALS — BP 146/90 | HR 51 | Ht 74.0 in | Wt 262.8 lb

## 2019-09-26 DIAGNOSIS — I440 Atrioventricular block, first degree: Secondary | ICD-10-CM | POA: Diagnosis not present

## 2019-09-26 DIAGNOSIS — E785 Hyperlipidemia, unspecified: Secondary | ICD-10-CM

## 2019-09-26 DIAGNOSIS — R001 Bradycardia, unspecified: Secondary | ICD-10-CM | POA: Diagnosis not present

## 2019-09-26 DIAGNOSIS — I10 Essential (primary) hypertension: Secondary | ICD-10-CM | POA: Diagnosis not present

## 2019-09-26 DIAGNOSIS — I2510 Atherosclerotic heart disease of native coronary artery without angina pectoris: Secondary | ICD-10-CM

## 2019-09-26 NOTE — Patient Instructions (Addendum)
Dr End recommends the DASH diet for you (handout provided) along with decreasing your intake of sodium/salt and working to lose weight.   Medication Instructions:  Your physician recommends that you continue on your current medications as directed. Please refer to the Current Medication list given to you today.  *If you need a refill on your cardiac medications before your next appointment, please call your pharmacy*  Follow-Up: At [facility], you and your health needs are our priority.  As part of our continuing mission to provide you with exceptional heart care, we have created designated Provider Care Teams.  These Care Teams include your primary Cardiologist (physician) and Advanced Practice Providers (APPs -  Physician Assistants and Nurse Practitioners) who all work together to provide you with the care you need, when you need it.  We recommend signing up for the patient portal called "MyChart".  Sign up information is provided on this After Visit Summary.  MyChart is used to connect with patients for Virtual Visits (Telemedicine).  Patients are able to view lab/test results, encounter notes, upcoming appointments, etc.  Non-urgent messages can be sent to your provider as well.   To learn more about what you can do with MyChart, go to [email or web address].    Your next appointment:   6 month(s)  The format for your next appointment:   In Person  Provider:    You may see DR Harrell Gave END or one of the following Advanced Practice Providers on your designated Care Team:    Murray Hodgkins, NP  Christell Faith, PA-C  Marrianne Mood, PA-C e

## 2019-10-15 ENCOUNTER — Ambulatory Visit: Payer: Medicare Other | Attending: Internal Medicine

## 2019-10-15 DIAGNOSIS — Z23 Encounter for immunization: Secondary | ICD-10-CM

## 2019-10-15 NOTE — Progress Notes (Signed)
   Covid-19 Vaccination Clinic  Name:  Jason Solomon    MRN: [id number] DOB: 03/13/1940  10/15/2019  [name] was observed post Covid-19 immunization for 15 minutes without incident. He was provided with Vaccine Information Sheet and instruction to access the V-Safe system.   [name] was instructed to call 911 with any severe reactions post vaccine: Marland Kitchen Difficulty breathing  . Swelling of face and throat  . A fast heartbeat  . A bad rash all over body  . Dizziness and weakness   Immunizations Administered    Name Date Dose VIS Date Route   Pfizer COVID-19 Vaccine 10/15/2019 10:41 AM 0.3 mL 04/18/2018 Intramuscular   Manufacturer: [employer], [employer]   Lot: [id number]   [place]: [id number]

## 2019-10-29 ENCOUNTER — Other Ambulatory Visit: Payer: Self-pay | Admitting: Family Medicine

## 2019-10-30 ENCOUNTER — Other Ambulatory Visit: Payer: Self-pay | Admitting: Internal Medicine

## 2019-10-30 MED ORDER — AMLODIPINE BESYLATE 2.5 MG PO TABS
2.5000 mg | ORAL_TABLET | Freq: Every day | ORAL | 1 refills | Status: DC
Start: 2019-10-30 — End: 2020-04-16

## 2019-10-30 NOTE — Telephone Encounter (Signed)
Requested Prescriptions   Signed Prescriptions Disp Refills   amLODipine (NORVASC) 2.5 MG tablet 90 tablet 1    Sig: Take 1 tablet (2.5 mg total) by mouth daily.    Authorizing Provider: END, CHRISTOPHER    Ordering User: Raelene Bott, Judah Chevere L

## 2019-10-30 NOTE — Telephone Encounter (Signed)
°*  STAT* If patient is at the pharmacy, call can be transferred to refill team.   1. Which medications need to be refilled? (please list name of each medication and dose if known) amlodipine 2.5mg    2. Which pharmacy/location (including street and city if local pharmacy) is medication to be sent to?[facility]  3. Do they need a 30 day or 90 day supply? 90   Patient states he is unsure if Dr. Saunders Revel would like patient to continue on this medication,  If so please send in to pharmacy

## 2019-11-09 DIAGNOSIS — H2513 Age-related nuclear cataract, bilateral: Secondary | ICD-10-CM | POA: Diagnosis not present

## 2019-12-01 ENCOUNTER — Ambulatory Visit (INDEPENDENT_AMBULATORY_CARE_PROVIDER_SITE_OTHER): Payer: Medicare Other

## 2019-12-01 ENCOUNTER — Other Ambulatory Visit: Payer: Self-pay

## 2019-12-01 DIAGNOSIS — Z23 Encounter for immunization: Secondary | ICD-10-CM | POA: Diagnosis not present

## 2019-12-24 ENCOUNTER — Other Ambulatory Visit: Payer: Self-pay | Admitting: Family Medicine

## 2020-01-24 ENCOUNTER — Telehealth: Payer: Self-pay | Admitting: Family Medicine

## 2020-01-24 ENCOUNTER — Other Ambulatory Visit (INDEPENDENT_AMBULATORY_CARE_PROVIDER_SITE_OTHER): Payer: Medicare Other

## 2020-01-24 ENCOUNTER — Other Ambulatory Visit: Payer: Self-pay

## 2020-01-24 DIAGNOSIS — Z1159 Encounter for screening for other viral diseases: Secondary | ICD-10-CM | POA: Diagnosis not present

## 2020-01-24 DIAGNOSIS — E785 Hyperlipidemia, unspecified: Secondary | ICD-10-CM

## 2020-01-24 DIAGNOSIS — M1A00X0 Idiopathic chronic gout, unspecified site, without tophus (tophi): Secondary | ICD-10-CM

## 2020-01-24 DIAGNOSIS — I10 Essential (primary) hypertension: Secondary | ICD-10-CM

## 2020-01-24 LAB — COMPREHENSIVE METABOLIC PANEL
ALT: 43 U/L (ref 0–53)
AST: 30 U/L (ref 0–37)
Albumin: 3.7 g/dL (ref 3.5–5.2)
Alkaline Phosphatase: 69 U/L (ref 39–117)
BUN: 15 mg/dL (ref 6–23)
CO2: 32 mEq/L (ref 19–32)
Calcium: 9.3 mg/dL (ref 8.4–10.5)
Chloride: 104 mEq/L (ref 96–112)
Creatinine, Ser: 1.09 mg/dL (ref 0.40–1.50)
GFR: 64.72 mL/min (ref >60.00)
Glucose, Bld: 94 mg/dL (ref 70–99)
Potassium: 4 mEq/L (ref 3.5–5.1)
Sodium: 140 mEq/L (ref 135–145)
Total Bilirubin: 1.1 mg/dL (ref 0.2–1.2)
Total Protein: 6.4 g/dL (ref 6.0–8.3)

## 2020-01-24 LAB — URIC ACID: Uric Acid, Serum: 5.5 mg/dL (ref 4.0–7.8)

## 2020-01-24 LAB — LIPID PANEL
Cholesterol: 121 mg/dL (ref 0–200)
HDL: 50.3 mg/dL (ref >39.00)
LDL Cholesterol: 55 mg/dL (ref 0–99)
NonHDL: 71.09
Total CHOL/HDL Ratio: 2
Triglycerides: 81 mg/dL (ref 0.0–149.0)
VLDL: 16.2 mg/dL (ref 0.0–40.0)

## 2020-01-24 NOTE — Progress Notes (Signed)
No critical labs need to be addressed urgently. We will discuss labs in detail at upcoming office visit.   

## 2020-01-24 NOTE — Telephone Encounter (Signed)
-----   Message from Cloyd Stagers, RT sent at 01/21/2020  1:58 PM EST ----- Regarding: Lab Orders for Thursday 12.2.2021 Please place lab orders for Thursday 12.2.2021, office visit for physical on Thursday 12.9.2021 Thank you, Dyke Maes RT(R)

## 2020-01-25 LAB — HEPATITIS C ANTIBODY
Hepatitis C Ab: NONREACTIVE
SIGNAL TO CUT-OFF: 0.01 (ref <1.00)

## 2020-01-25 NOTE — Progress Notes (Signed)
No critical labs need to be addressed urgently. We will discuss labs in detail at upcoming office visit.   

## 2020-01-31 ENCOUNTER — Ambulatory Visit (INDEPENDENT_AMBULATORY_CARE_PROVIDER_SITE_OTHER): Payer: Medicare Other | Admitting: Family Medicine

## 2020-01-31 ENCOUNTER — Other Ambulatory Visit: Payer: Self-pay

## 2020-01-31 ENCOUNTER — Encounter: Payer: Self-pay | Admitting: Family Medicine

## 2020-01-31 VITALS — BP 132/80 | HR 49 | Temp 98.1°F | Ht 72.5 in | Wt 256.2 lb

## 2020-01-31 DIAGNOSIS — E7800 Pure hypercholesterolemia, unspecified: Secondary | ICD-10-CM

## 2020-01-31 DIAGNOSIS — K219 Gastro-esophageal reflux disease without esophagitis: Secondary | ICD-10-CM | POA: Insufficient documentation

## 2020-01-31 DIAGNOSIS — J449 Chronic obstructive pulmonary disease, unspecified: Secondary | ICD-10-CM

## 2020-01-31 DIAGNOSIS — I2510 Atherosclerotic heart disease of native coronary artery without angina pectoris: Secondary | ICD-10-CM | POA: Diagnosis not present

## 2020-01-31 DIAGNOSIS — E6609 Other obesity due to excess calories: Secondary | ICD-10-CM | POA: Diagnosis not present

## 2020-01-31 DIAGNOSIS — M79671 Pain in right foot: Secondary | ICD-10-CM

## 2020-01-31 DIAGNOSIS — I700 Atherosclerosis of aorta: Secondary | ICD-10-CM

## 2020-01-31 DIAGNOSIS — Z6833 Body mass index (BMI) 33.0-33.9, adult: Secondary | ICD-10-CM

## 2020-01-31 DIAGNOSIS — E890 Postprocedural hypothyroidism: Secondary | ICD-10-CM

## 2020-01-31 DIAGNOSIS — I10 Essential (primary) hypertension: Secondary | ICD-10-CM | POA: Diagnosis not present

## 2020-01-31 DIAGNOSIS — Z0000 Encounter for general adult medical examination without abnormal findings: Secondary | ICD-10-CM | POA: Diagnosis not present

## 2020-01-31 NOTE — Assessment & Plan Note (Signed)
Decrease acid triggers.Marland Kitchen eating late, spicy foods, greasy foods, alcohol. Acidic foods like tomato, citris, caffeine, chocolate.  Start 2-4 week course of prilosec 20 mg OTC daily. If not improving increase to 2 tabs pr 20 mg prilosec.

## 2020-01-31 NOTE — Assessment & Plan Note (Signed)
On statin.

## 2020-01-31 NOTE — Assessment & Plan Note (Signed)
Stable, chronic.  Continue current medication.   Anoro Ellipta BID

## 2020-01-31 NOTE — Patient Instructions (Addendum)
Can try diclofenac gel ( Voltaren ) four time daily as needed for right foot pain.Marland Kitchen like osteoarthritis.  Decrease acid triggers.Marland Kitchen eating late, spicy foods, greasy foods, alcohol. Acidic foods like tomato, citris, caffeine, chocolate.  Start 2-4 week course of prilosec 20 mg OTC daily. If not improving increase to 2 tabs pr 20 mg prilosec. Get back to regualr exercsie.

## 2020-01-31 NOTE — Assessment & Plan Note (Signed)
Can try diclofenac gel ( Voltaren ) four time daily as needed for right foot pain.Marland Kitchen like osteoarthritis.

## 2020-01-31 NOTE — Assessment & Plan Note (Signed)
Encouraged exercise, weight loss, healthy eating habits. ? ?

## 2020-01-31 NOTE — Progress Notes (Signed)
Chief Complaint  Patient presents with  . Annual Exam    History of Present Illness: HPI  The patient presents for annual medicare wellness and  review of chronic health problems. He/She also has the following acute concerns today:  Right foot occ stiffness, soreness lasts 1 hours hen goes away, improved with moving around,occuring off and on for 1 year. No redness, no increase in heat. Occurs if he  Has been up on feet or golfing.  New GERD flares.. see below.  I have personally reviewed the Medicare Annual Wellness questionnaire and have noted 1. The patient's medical and social history 2. Their use of alcohol, tobacco or illicit drugs 3. Their current medications and supplements 4. The patient's functional ability including ADL's, fall risks, home safety risks and hearing or visual             impairment. 5. Diet and physical activities 6. Evidence for depression or mood disorders 7.         Updated provider list Cognitive evaluation was performed and recorded on pt medicare questionnaire form. The patients weight, height, BMI and visual acuity have been recorded in the chart  I have made referrals, counseling and provided education to the patient based review of the above and I have provided the pt with a written personalized care plan for preventive services.   Documentation of this information was scanned into the electronic record under the media tab.   Advance directives and end of life planning reviewed in detail with patient and documented in EMR. Patient given handout on advance care directives if needed. HCPOA and living will updated if needed.  [facility] Office Visit from 01/31/2020 in [place] at [place]  PHQ-2 Total Score 0      Fall Risk  01/31/2020 02/08/2019 02/01/2018 01/12/2018 01/28/2017  Falls in the past year? 0 0 0 0 No  Comment - - - Emmi Telephone Survey: data to providers prior to load -  Number falls in past yr: - 0 - - -  Injury  with Fall? - 0 - - -  Risk for fall due to : - Medication side effect - - -  Follow up - Falls evaluation completed;Falls prevention discussed - - -   Hypertension:    Stable control on HCTZ 25 mg dialy, amlodipine  2.5 mg daily BP Readings from Last 3 Encounters:  01/31/20 132/80  09/26/19 (!) 146/90  06/13/19 132/80  Using medication without problems or lightheadedness: none  Chest pain with exertion:none Edema:none Short of breath:none Average home BPs: Other issues:  Elevated Cholesterol: LDL at goal < 70. On lipitor 20 mg daily. Lab Results  Component Value Date   CHOL 121 01/24/2020   HDL 50.30 01/24/2020   LDLCALC 55 01/24/2020   TRIG 81.0 01/24/2020   CHOLHDL 2 01/24/2020  Using medications without problems:none Muscle aches: none Diet compliance: good Exercise: walking rarely.. harder since moved Other complaints:  CAD, Aortic atherosclerosis: followed by Cardiology  GERD: occurring 2 times a week... more in last year. Sou taste in throat, clearing throat.   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  Hx of thyroiditis, treatment and resulting hypothyroid: Followed by Endo. Dr. Loanne Drilling.  Patient Care Team: Jinny Sanders, MD as PCP - General Birder Robson, MD as Referring Physician (Ophthalmology) Renato Shin, MD as Consulting Physician (Endocrinology) Nickie Retort,  MD as Consulting Physician (Urology)       This visit occurred during the SARS-CoV-2 public health emergency.  Safety protocols were in place, including screening questions prior to the visit, additional usage of staff PPE, and extensive cleaning of exam room while observing appropriate contact time as indicated for disinfecting solutions.   COVID 19 screen:  No recent travel or known exposure to COVID19 The patient denies respiratory symptoms of COVID 19 at this time. The importance  of social distancing was discussed today.    Review of Systems  Constitutional: Negative for chills and fever.  HENT: Negative for congestion and ear pain.   Eyes: Negative for pain and redness.  Respiratory: Negative for cough and shortness of breath.   Cardiovascular: Negative for chest pain, palpitations and leg swelling.  Gastrointestinal: Positive for heartburn. Negative for abdominal pain, blood in stool, constipation, diarrhea, nausea and vomiting.  Genitourinary: Negative for dysuria.  Musculoskeletal: Negative for falls and myalgias.  Skin: Negative for rash.  Neurological: Negative for dizziness.  Psychiatric/Behavioral: Negative for depression. The patient is not nervous/anxious.       Past Medical History:  Diagnosis Date  . 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 COPD (chronic obstructive pulmonary disease) ([place])   . DIVERTICULOSIS, COLON 01/12/2007  . 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 GERD (gastroesophageal reflux disease)   . GOITER, MULTINODULAR 05/04/2010  . GOUT 01/12/2007  . Heart murmur    childhood only  . HYPERCHOLESTEROLEMIA 06/20/2007  . HYPERTENSION 01/12/2007  . HYPERTHYROIDISM 04/24/2010  . OBESITY 01/12/2007  . PROSTATE SPECIFIC ANTIGEN, ELEVATED 10/08/2009  . Renal calculi   . Shortness of breath dyspnea    with exertion    reports that he quit smoking about 31 years ago. He has a 60.00 pack-year smoking history. He has never used smokeless tobacco. He reports current alcohol use of about 5.0 standard drinks of alcohol per week. He reports that he does not use drugs.   Observations/Objective: Blood pressure 132/80, pulse (!) 49, temperature 98.1 F (36.7 C), temperature source Temporal, height 6' 0.5" (1.842 m), weight 256 lb 4 oz (116.2 kg), SpO2 99 %.  Physical Exam Constitutional:      General: He is not in acute distress.    Appearance: Normal appearance. He is well-developed and well-nourished. He is not ill-appearing or  toxic-appearing.  HENT:     Head: Normocephalic and atraumatic.     Right Ear: Hearing, tympanic membrane, ear canal and external ear normal.     Left Ear: Hearing, tympanic membrane, ear canal and external ear normal.     Nose: Nose normal.     Mouth/Throat:     Mouth: Oropharynx is clear and moist and mucous membranes are normal.     Pharynx: Uvula midline.  Eyes:     General: Lids are normal. Lids are everted, no foreign bodies appreciated.     Extraocular Movements: EOM normal.     Conjunctiva/sclera: Conjunctivae normal.     Pupils: Pupils are equal, round, and reactive to light.  Neck:     Thyroid: No thyroid mass or thyromegaly.     Vascular: No carotid bruit.     Trachea: Trachea and phonation normal.  Cardiovascular:     Rate and Rhythm: Normal rate and regular rhythm.     Pulses: Normal pulses and intact distal pulses.     Heart sounds: S1 normal and S2 normal. No murmur heard. No gallop.   Pulmonary:  Breath sounds: Normal breath sounds. No wheezing, rhonchi or rales.  Abdominal:     General: Bowel sounds are normal.     Palpations: Abdomen is soft. There is no hepatosplenomegaly.     Tenderness: There is no abdominal tenderness. There is no CVA tenderness, guarding or rebound.     Hernia: No hernia is present.  Musculoskeletal:     Cervical back: Normal range of motion and neck supple.  Lymphadenopathy:     Cervical: No cervical adenopathy.  Skin:    General: Skin is warm, dry and intact.     Findings: No rash.  Neurological:     Mental Status: He is alert.     Cranial Nerves: No cranial nerve deficit.     Sensory: No sensory deficit.     Gait: Gait normal.     Deep Tendon Reflexes: Strength normal and reflexes are normal and symmetric.  Psychiatric:        Mood and Affect: Mood and affect normal.        Speech: Speech normal.        Behavior: Behavior normal.        Judgment: Judgment normal.      Assessment and Plan   The patient's preventative  maintenance and recommended screening tests for an annual wellness exam were reviewed in full today. Brought up to date unless services declined.  Counselled on the importance of diet, exercise, and its role in overall health and mortality. The patient's FH and SH was reviewed, including their home life, tobacco status, and drug and alcohol status.   Vaccines:uptodatenow COVID x 3, Prostate Cancer Screen:per urology, Dr Sharin Grave     Colon Cancer Screen:Neg cologuard 06/10/2015,  No further cancer screening needed for colon after age 71.      Smoking Status:former smoker > 60 pack history notcandidate for CT screen as quit >15 years ago. Hearing screen failed on2017, [id number] decrease in hearing Hep C: done  COPD, mild (HCC) Stable, chronic.  Continue current medication.   Anoro Ellipta BID  Essential hypertension Stable, chronic.  Continue current medication. HCTZ 25 mg daily  Amlodipine 2.5 mg daily  HYPERCHOLESTEROLEMIA Stable, chronic.  Continue current medication. Atorvastatin 20 mg daily.  Right foot pain Can try diclofenac gel ( Voltaren ) four time daily as needed for right foot pain.Marland Kitchen like osteoarthritis.   Gastroesophageal reflux disease Decrease acid triggers.Marland Kitchen eating late, spicy foods, greasy foods, alcohol. Acidic foods like tomato, citris, caffeine, chocolate.  Start 2-4 week course of prilosec 20 mg OTC daily. If not improving increase to 2 tabs pr 20 mg prilosec.  Hypothyroidism following radioiodine therapy Stable, chronic.  Continue current medication. levothyroxine  137 mcg daily  Class 1 obesity with serious comorbidity and body mass index (BMI) of 33.0 to 33.9 in adult Encouraged exercise, weight loss, healthy eating habits.   Aortic atherosclerosis (HCC) On statin.  Coronary artery disease involving native coronary artery of native heart without angina pectoris Followed by Cardiology.    Eliezer Lofts, MD

## 2020-01-31 NOTE — Assessment & Plan Note (Signed)
Stable, chronic.  Continue current medication. levothyroxine  137 mcg daily

## 2020-01-31 NOTE — Assessment & Plan Note (Signed)
Stable, chronic.  Continue current medication. ? ? ?Atorvastatin 20 mg daily ?

## 2020-01-31 NOTE — Assessment & Plan Note (Signed)
Stable, chronic.  Continue current medication. HCTZ 25 mg daily  Amlodipine 2.5 mg daily

## 2020-01-31 NOTE — Assessment & Plan Note (Signed)
Followed by Cardiology 

## 2020-02-13 ENCOUNTER — Other Ambulatory Visit: Payer: Self-pay | Admitting: Family Medicine

## 2020-03-12 ENCOUNTER — Other Ambulatory Visit: Payer: Self-pay | Admitting: Endocrinology

## 2020-03-18 ENCOUNTER — Other Ambulatory Visit: Payer: Self-pay | Admitting: Family Medicine

## 2020-04-16 ENCOUNTER — Ambulatory Visit (INDEPENDENT_AMBULATORY_CARE_PROVIDER_SITE_OTHER): Payer: Medicare Other | Admitting: Internal Medicine

## 2020-04-16 ENCOUNTER — Other Ambulatory Visit: Payer: Self-pay

## 2020-04-16 ENCOUNTER — Encounter: Payer: Self-pay | Admitting: Internal Medicine

## 2020-04-16 VITALS — BP 146/90 | HR 55 | Ht 73.0 in | Wt 264.0 lb

## 2020-04-16 DIAGNOSIS — I10 Essential (primary) hypertension: Secondary | ICD-10-CM

## 2020-04-16 DIAGNOSIS — E785 Hyperlipidemia, unspecified: Secondary | ICD-10-CM | POA: Diagnosis not present

## 2020-04-16 DIAGNOSIS — R001 Bradycardia, unspecified: Secondary | ICD-10-CM

## 2020-04-16 DIAGNOSIS — I440 Atrioventricular block, first degree: Secondary | ICD-10-CM

## 2020-04-16 DIAGNOSIS — I2510 Atherosclerotic heart disease of native coronary artery without angina pectoris: Secondary | ICD-10-CM | POA: Diagnosis not present

## 2020-04-16 DIAGNOSIS — I491 Atrial premature depolarization: Secondary | ICD-10-CM | POA: Insufficient documentation

## 2020-04-16 MED ORDER — AMLODIPINE BESYLATE 5 MG PO TABS: 5.0000 mg | ORAL_TABLET | Freq: Every day | ORAL | 2 refills | Status: DC

## 2020-04-16 MED ORDER — ASPIRIN EC 81 MG PO TBEC: 81.0000 mg | DELAYED_RELEASE_TABLET | Freq: Every day | ORAL | 3 refills | Status: DC

## 2020-04-16 NOTE — Progress Notes (Signed)
Follow-up Outpatient Visit Date: 04/16/2020  Primary Care Provider: Jinny Sanders, MD [place] [address]  Chief Complaint: Follow-up exertional dyspnea  HPI:  Jason Solomon is a 80 y.o. male with history of non-obstructive CAD by coronary CTA in 08/2018,COPD, hypertension, hyperlipidemia, GERD, obesity, and angioedema (suspected to be due to ACE inhibitor), who presents for follow-up of coronary artery disease and shortness of breath.  I last saw him in early August, at which time he reported stable exertional dyspnea.  BP was mildly elevated in the office but typically better at home.  We agreed to defer medication changes in favor of sodium restriction.  Today, Jason Solomon reports he feels about the same as at our prior visits.  He still has exertional dyspnea but is able to do his normal activities including exercise and play golf.  He has not had any chest pain, palpitations, or lightheadedness.  Mild lower extremity edema is stable.  Home blood pressures are typically in the 130s over 80s.  He is trying to minimize his sodium intake.  He is not taking aspirin after a prior conversation with Dr. Diona Browner.   --------------------------------------------------------------------------------------------------  Cardiovascular History & Procedures: Cardiovascular Problems:  Shortness of breath  Nonobstructive coronary artery disease  Risk Factors:  Moderate coronary artery disease, hypertension, hyperlipidemia, obesity, male gender, and age greater than 44  Cath/PCI:  None  CV Surgery:  None  EP Procedures and Devices:  None  Non-Invasive Evaluation(s):  Cardiac CTA (09/01/2018): LMCA normal. LAD with 50-69% proximal and mid vessel disease. LCx with 60-69% stenosis in midvessel. Dominant RCA with diffuse 25-49% stenosis in the proximal and mid vessel. Distal RCA with 50-69% calcified plaque. CTFFR is not significant in any vessel. Coronary  calcium score 1,093 Agatston units (77 percentile for age/sex). Aortic atherosclerosis noted.  TTE (11/18/2017): Normal LV size with mild LVH. LVEF 55-60% with normal wall motion and grade 1 diastolic dysfunction. Mild MR. Mild biatrial enlargement. Normal RV size and function. Normal PA pressure.  Pharmacologic myocardial perfusion stress test (10/31/2017): Intermediate risk study with fixed defect involving the inferior, inferolateral, and apical segments thought to be artifact versus scar. No ischemia was seen. LVEF 41%.  Exercise stress echo (03/19/2015): Normal study without ischemia. Frequent PAC's and PVC's noted at rest and during stress.   Recent CV Pertinent Labs: Lab Results  Component Value Date   CHOL 121 01/24/2020   HDL 50.30 01/24/2020   LDLCALC 55 01/24/2020   TRIG 81.0 01/24/2020   CHOLHDL 2 01/24/2020   K 4.0 01/24/2020   MG 2.0 01/16/2014   BUN 15 01/24/2020   CREATININE 1.09 01/24/2020    Past medical and surgical history were reviewed and updated in EPIC.  Current Meds  Medication Sig  . allopurinol (ZYLOPRIM) 300 MG tablet TAKE 1 TABLET BY MOUTH  DAILY  . amLODipine (NORVASC) 2.5 MG tablet Take 1 tablet (2.5 mg total) by mouth daily.  Marland Kitchen atorvastatin (LIPITOR) 20 MG tablet TAKE 1 TABLET BY MOUTH  DAILY  . Calcium Carb-Cholecalciferol (CALCIUM 1000 + D PO) Take by mouth.  . fexofenadine (ALLEGRA) 180 MG tablet Take 180 mg by mouth daily.  . hydrochlorothiazide (HYDRODIURIL) 25 MG tablet TAKE 1 TABLET BY MOUTH  DAILY  . levothyroxine (SYNTHROID) 137 MCG tablet TAKE 1 TABLET BY MOUTH  DAILY BEFORE BREAKFAST  . mometasone (NASONEX) 50 MCG/ACT nasal spray Place 2 sprays into the nose as needed.  . montelukast (SINGULAIR) 10 MG tablet Take 1 tablet (  10 mg total) by mouth daily as needed. Only when flying  . polyethylene glycol (MIRALAX / GLYCOLAX) packet Take 17 g by mouth daily as needed.  . terazosin (HYTRIN) 5 MG capsule TAKE 1 CAPSULE BY MOUTH  DAILY  .  ULTRAVATE 0.05 % LOTN APPLY ON THE SKIN DAILY. AVOID FACE, GROIN, AND UNDERARMS  . umeclidinium-vilanterol (ANORO ELLIPTA) 62.5-25 MCG/INH AEPB Inhale 1 puff into the lungs every morning.    Allergies: Ace inhibitors and Angiotensin receptor blockers  Social History   Tobacco Use  . Smoking status: Former Smoker    Packs/day: 2.00    Years: 30.00    Pack years: 60.00    Quit date: 1990    Years since quitting: 32.1  . Smokeless tobacco: Never Used  Vaping Use  . Vaping Use: Never used  Substance Use Topics  . Alcohol use: Yes    Alcohol/week: 5.0 standard drinks    Types: 5 Glasses of wine per week  . Drug use: No    Family History  Problem Relation Age of Onset  . Hypertension Mother   . COPD Brother   . Lung cancer Brother 27  . Throat cancer Brother   . Hypertension Father   . Stroke Father   . Cancer Maternal Grandmother        unknown  . Thyroid disease Neg Hx     Review of Systems: A 12-system review of systems was performed and was negative except as noted in the HPI.  --------------------------------------------------------------------------------------------------  Physical Exam: BP (!) 146/90 (BP Location: Left Arm, Patient Position: Sitting, Cuff Size: Normal)   Pulse (!) 55   Ht 6\' 1"  (1.854 m)   Wt 264 lb (119.7 kg)   SpO2 94%   BMI 34.83 kg/m   General:  NAD. Neck: No JVD or HJR. Lungs: Clear to auscultation bilaterally without wheezes or crackles. Heart: Regular rate and rhythm with occasional extrasystoles.  2/6 systolic murmur.  No rubs or gallops. Abdomen: Soft, nontender, nondistended. Extremities: Trace calf edema bilaterally.  EKG: Sinus bradycardia with first-degree AV block (PR interval 236 ms) and PACs.  Left axis deviation.  Lab Results  Component Value Date   WBC 3.8 (L) 10/07/2017   HGB 13.9 10/07/2017   HCT 42.5 10/07/2017   MCV 92.6 10/07/2017   PLT 181.0 10/07/2017    Lab Results  Component Value Date   NA 140  01/24/2020   K 4.0 01/24/2020   CL 104 01/24/2020   CO2 32 01/24/2020   BUN 15 01/24/2020   CREATININE 1.09 01/24/2020   GLUCOSE 94 01/24/2020   ALT 43 01/24/2020    Lab Results  Component Value Date   CHOL 121 01/24/2020   HDL 50.30 01/24/2020   LDLCALC 55 01/24/2020   TRIG 81.0 01/24/2020   CHOLHDL 2 01/24/2020    --------------------------------------------------------------------------------------------------  ASSESSMENT AND PLAN: Coronary artery disease: Jason Solomon reports stable exertional dyspnea without angina.  Cardiac CTA in 08/2018 showed moderate multivessel CAD that was not significant by CT FFR.  We will continue with medical therapy.  I have recommended reinitiation of aspirin 81 mg daily in the setting of moderate multivessel CAD.  We will continue with atorvastatin 20 mg daily, given that LDL is well controlled.  We will increase amlodipine for improved blood pressure control.  This may offer some antianginal benefit as well.  Hypertension: Blood pressure remains suboptimally controlled today.  Importance of sodium restriction and exercise were reinforced.  We have agreed to increase amlodipine  to 5 mg daily.  Blood pressure can be rechecked when Mr. Carlis Abbott is seen in follow-up by Dr. Loanne Drilling in April.  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  Patient is not on any AV nodal blocking agents.  We will continue to defer addition of beta-blocker or nondihydropyridine calcium channel blocker.  Hyperlipidemia: LDL at goal.  Continue atorvastatin 20 mg daily.  Follow-up: Return to clinic in 6 months.  Nelva Bush, MD 04/16/2020 8:03 AM

## 2020-04-16 NOTE — Patient Instructions (Signed)
Medication Instructions:  Your physician has recommended you make the following change in your medication:  1- START Aspirin 81 mg by mouth once a day. 2- START Amlodipine 5 mg by mouth once a day.  *If you need a refill on your cardiac medications before your next appointment, please call your pharmacy*  Follow-Up: At [facility], you and your health needs are our priority.  As part of our continuing mission to provide you with exceptional heart care, we have created designated Provider Care Teams.  These Care Teams include your primary Cardiologist (physician) and Advanced Practice Providers (APPs -  Physician Assistants and Nurse Practitioners) who all work together to provide you with the care you need, when you need it.  We recommend signing up for the patient portal called "MyChart".  Sign up information is provided on this After Visit Summary.  MyChart is used to connect with patients for Virtual Visits (Telemedicine).  Patients are able to view lab/test results, encounter notes, upcoming appointments, etc.  Non-urgent messages can be sent to your provider as well.   To learn more about what you can do with MyChart, go to [email or web address].    Your next appointment:   6 month(s)  The format for your next appointment:   In Person  Provider:   You may see DR Harrell Gave END or one of the following Advanced Practice Providers on your designated Care Team:    Murray Hodgkins, NP  Christell Faith, PA-C  Marrianne Mood, PA-C  Cadence [place], [place]  Laurann [place], NP

## 2020-04-28 ENCOUNTER — Other Ambulatory Visit: Payer: Self-pay | Admitting: Internal Medicine

## 2020-04-29 ENCOUNTER — Other Ambulatory Visit: Payer: Self-pay | Admitting: Internal Medicine

## 2020-04-30 ENCOUNTER — Other Ambulatory Visit: Payer: Self-pay | Admitting: Internal Medicine

## 2020-05-01 ENCOUNTER — Other Ambulatory Visit: Payer: Self-pay | Admitting: Internal Medicine

## 2020-05-02 ENCOUNTER — Other Ambulatory Visit: Payer: Self-pay | Admitting: Internal Medicine

## 2020-05-06 ENCOUNTER — Other Ambulatory Visit: Payer: Self-pay | Admitting: Family Medicine

## 2020-05-16 ENCOUNTER — Other Ambulatory Visit: Payer: Self-pay | Admitting: Internal Medicine

## 2020-05-17 ENCOUNTER — Other Ambulatory Visit: Payer: Self-pay | Admitting: Pulmonary Disease

## 2020-05-27 ENCOUNTER — Telehealth: Payer: Self-pay | Admitting: Family Medicine

## 2020-05-27 NOTE — Telephone Encounter (Signed)
Patient returned our call not notes

## 2020-06-10 ENCOUNTER — Ambulatory Visit: Payer: Medicare Other | Admitting: Endocrinology

## 2020-06-17 ENCOUNTER — Other Ambulatory Visit: Payer: Self-pay

## 2020-06-17 ENCOUNTER — Ambulatory Visit (INDEPENDENT_AMBULATORY_CARE_PROVIDER_SITE_OTHER): Payer: Medicare Other | Admitting: Endocrinology

## 2020-06-17 VITALS — BP 146/80 | HR 55 | Ht 73.0 in | Wt 264.4 lb

## 2020-06-17 DIAGNOSIS — I2510 Atherosclerotic heart disease of native coronary artery without angina pectoris: Secondary | ICD-10-CM | POA: Diagnosis not present

## 2020-06-17 DIAGNOSIS — E890 Postprocedural hypothyroidism: Secondary | ICD-10-CM

## 2020-06-17 LAB — TSH: TSH: 1.15 u[IU]/mL (ref 0.35–4.50)

## 2020-06-17 LAB — T4, FREE: Free T4: 0.9 ng/dL (ref 0.60–1.60)

## 2020-06-17 NOTE — Progress Notes (Signed)
Subjective:    Patient ID: Jason Solomon, male    DOB: 15-Jan-1941, 80 y.o.   MRN: [id number]  HPI Pt returns for f/u of post-RAI hypothyroidism  (he had RAI for hyperthyroidism, due to small multinodular goiter, in late 2012; he has since had a fluctuating dosage requirement for synthroid; F/U US in 2014 showed only 6 mm nodule).  pt states he feels well in general.   Past Medical History:  Diagnosis Date  . 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 COPD (chronic obstructive pulmonary disease) ([place])   . DIVERTICULOSIS, COLON 01/12/2007  . 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 GERD (gastroesophageal reflux disease)   . GOITER, MULTINODULAR 05/04/2010  . GOUT 01/12/2007  . Heart murmur    childhood only  . HYPERCHOLESTEROLEMIA 06/20/2007  . HYPERTENSION 01/12/2007  . HYPERTHYROIDISM 04/24/2010  . OBESITY 01/12/2007  . PROSTATE SPECIFIC ANTIGEN, ELEVATED 10/08/2009  . Renal calculi   . Shortness of breath dyspnea    with exertion    Past Surgical History:  Procedure Laterality Date  . CARDIOVASCULAR STRESS TEST  2006   negative  . HAND SURGERY  1949   left hand laceration  . HERNIA REPAIR  1990's   right and left inguinal hernia  . KNEE SURGERY  1990's  . TONSILLECTOMY  1945    Social History   Socioeconomic History  . Marital status: Married    Spouse name: Not on file  . Number of children: 4  . Years of education: Not on file  . Highest education level: Not on file  Occupational History    Employer: RETIRED    Comment: Retired  Tobacco Use  . Smoking status: Former Smoker    Packs/day: 2.00    Years: 30.00    Pack years: 60.00    Quit date: 1990    Years since quitting: 32.3  . Smokeless tobacco: Never Used  Vaping Use  . Vaping Use: Never used  Substance and Sexual Activity  . Alcohol use: Yes    Alcohol/week: 5.0 standard drinks    Types: 5 Glasses of wine per week  . Drug use: No  . Sexual activity: Never  Other Topics Concern  . Not on  file  Social History Narrative   Regular exercise-yes, walks , golf   Occupation: Retired Hydrographic surveyor   Diet: (+) fruit, (+) veggies, (+) salad, (+) H20   Children: 4, healthy   Married x 43 years   Living will, Drew, daughter. Full code. (reviewed 2013)   Social Determinants of Health   Financial Resource Strain: Not on file  Food Insecurity: Not on file  Transportation Needs: Not on file  Physical Activity: Not on file  Stress: Not on file  Social Connections: Not on file  Intimate Partner Violence: Not on file    Current Outpatient Medications on File Prior to Visit  Medication Sig Dispense Refill  . allopurinol (ZYLOPRIM) 300 MG tablet TAKE 1 TABLET BY MOUTH  DAILY 90 tablet 3  . amLODipine (NORVASC) 5 MG tablet Take 1 tablet (5 mg total) by mouth daily. 90 tablet 2  . ANORO ELLIPTA 62.5-25 MCG/INH AEPB INHALE 1 INHALATION BY  MOUTH INTO THE LUNGS IN THE MORNING 180 each 0  . aspirin EC 81 MG tablet Take 1 tablet (81 mg total) by mouth daily. Swallow whole. 90 tablet 3  . atorvastatin (LIPITOR) 20 MG tablet TAKE 1 TABLET BY MOUTH  DAILY 90  tablet 3  . Calcium Carb-Cholecalciferol (CALCIUM 1000 + D PO) Take by mouth.    . fexofenadine (ALLEGRA) 180 MG tablet Take 180 mg by mouth daily.    . hydrochlorothiazide (HYDRODIURIL) 25 MG tablet TAKE 1 TABLET BY MOUTH  DAILY 90 tablet 3  . levothyroxine (SYNTHROID) 137 MCG tablet TAKE 1 TABLET BY MOUTH  DAILY BEFORE BREAKFAST 90 tablet 3  . mometasone (NASONEX) 50 MCG/ACT nasal spray Place 2 sprays into the nose as needed. 17 g 0  . montelukast (SINGULAIR) 10 MG tablet Take 1 tablet (10 mg total) by mouth daily as needed. Only when flying 30 tablet 11  . polyethylene glycol (MIRALAX / GLYCOLAX) packet Take 17 g by mouth daily as needed.    . terazosin (HYTRIN) 5 MG capsule TAKE 1 CAPSULE BY MOUTH  DAILY 90 capsule 3  . ULTRAVATE 0.05 % LOTN APPLY ON THE SKIN DAILY. AVOID FACE, GROIN, AND UNDERARMS 60 mL  0   No current facility-administered medications on file prior to visit.    Allergies  Allergen Reactions  . Ace Inhibitors     REACTION: ?angioedema  . Angiotensin Receptor Blockers     REACTION: angioedema on ACE?    Family History  Problem Relation Age of Onset  . Hypertension Mother   . COPD Brother   . Lung cancer Brother 71  . Throat cancer Brother   . Hypertension Father   . Stroke Father   . Cancer Maternal Grandmother        unknown  . Thyroid disease Neg Hx     BP (!) 146/80 (BP Location: Right Arm, Patient Position: Sitting, Cuff Size: Large)   Pulse (!) 55   Ht 6\' 1"  (1.854 m)   Wt 264 lb 6.4 oz (119.9 kg)   SpO2 98%   BMI 34.88 kg/m    Review of Systems     Objective:   Physical Exam VITAL SIGNS:  See vs page GENERAL: no distress NECK: There is no palpable thyroid enlargement.  No thyroid nodule is palpable.  No palpable lymphadenopathy at the anterior neck.     Lab Results  Component Value Date   TSH 1.15 06/17/2020       Assessment & Plan:  Hypothyroidism: well-controlled.  Please continue the same synthroid.

## 2020-06-17 NOTE — Patient Instructions (Addendum)
Your blood pressure is high today.  Please see your primary care provider soon, to have it rechecked Blood tests are requested for you today.  We'll let you know about the results.   It is best to never miss the medication.  However, if you do miss it, next best is to double up the next time.   Please come back for a follow-up appointment in 1 year.   

## 2020-07-08 ENCOUNTER — Ambulatory Visit (INDEPENDENT_AMBULATORY_CARE_PROVIDER_SITE_OTHER): Payer: Medicare Other | Admitting: Family Medicine

## 2020-07-08 ENCOUNTER — Other Ambulatory Visit: Payer: Self-pay

## 2020-07-08 VITALS — BP 120/82 | HR 76 | Temp 97.8°F | Ht 72.05 in | Wt 263.0 lb

## 2020-07-08 DIAGNOSIS — L989 Disorder of the skin and subcutaneous tissue, unspecified: Secondary | ICD-10-CM | POA: Insufficient documentation

## 2020-07-08 DIAGNOSIS — I10 Essential (primary) hypertension: Secondary | ICD-10-CM

## 2020-07-08 NOTE — Progress Notes (Signed)
Subjective:     Jason Solomon is a 80 y.o. male presenting for Ankle Pain (L x 2 weeks )     HPI   #Sore - on the side of the foot  - started "a whlie" ago - pain has increased over the last few weeks - if walking, tenderness increases - if he presses no pain today - but in the evening this soreness will get worse occasionally  - sometimes severe pain in the evening   Review of Systems  Constitutional: Negative for chills and fever.  Musculoskeletal: Negative for joint swelling.       Ankle pain  Skin: Negative for color change and rash.     Social History   Tobacco Use  Smoking Status Former Smoker  . Packs/day: 2.00  . Years: 30.00  . Pack years: 60.00  . Quit date: 75  . Years since quitting: 32.3  Smokeless Tobacco Never Used        Objective:    BP Readings from Last 3 Encounters:  07/08/20 120/82  06/17/20 (!) 146/80  04/16/20 (!) 146/90   Wt Readings from Last 3 Encounters:  07/08/20 263 lb (119.3 kg)  06/17/20 264 lb 6.4 oz (119.9 kg)  04/16/20 264 lb (119.7 kg)    BP 120/82   Pulse 76   Temp 97.8 F (36.6 C) (Temporal)   Ht 6' 0.05" (1.83 m)   Wt 263 lb (119.3 kg)   SpO2 96%   BMI 35.62 kg/m    Physical Exam Constitutional:      Appearance: Normal appearance. He is not ill-appearing or diaphoretic.  HENT:     Right Ear: External ear normal.     Left Ear: External ear normal.  Eyes:     General: No scleral icterus.    Extraocular Movements: Extraocular movements intact.     Conjunctiva/sclera: Conjunctivae normal.  Cardiovascular:     Rate and Rhythm: Normal rate.  Pulmonary:     Effort: Pulmonary effort is normal.  Musculoskeletal:     Cervical back: Neck supple.     Comments: Trace b/l edema. Normal foot ROM no bony tenderness or pain.   Skin:    General: Skin is warm and dry.     Comments: Left foot with small cyst like lesion on the lateral edge. No erythema some hypopigmentation. One area of small scab on the  bottom portion of the lesion. Soft, non-mobile.   Neurological:     Mental Status: He is alert. Mental status is at baseline.  Psychiatric:        Mood and Affect: Mood normal.           Assessment & Plan:   Problem List Items Addressed This Visit      Cardiovascular and Mediastinum   Essential hypertension (Chronic)    Chronic LE edema per patient - suspect 2/2 to amlodipine but bp controlled. Cont amlodipine 5 mg if not bothered by edema and hctz 25 mg.         Musculoskeletal and Integument   Skin lesion of foot - Primary    Etiology unclear - reports long hx but only recently painful. ?cyst. No erythema to suggest infection/abscess though warning signs discussed. No bony tenderness. Advised podiatry evaluation for further treatment.       Relevant Orders   Ambulatory referral to Podiatry     No hx of diabetes   Return if symptoms worsen or fail to improve.  Lesleigh Noe, MD  This visit occurred during the SARS-CoV-2 public health emergency.  Safety protocols were in place, including screening questions prior to the visit, additional usage of staff PPE, and extensive cleaning of exam room while observing appropriate contact time as indicated for disinfecting solutions.

## 2020-07-08 NOTE — Assessment & Plan Note (Signed)
Etiology unclear - reports long hx but only recently painful. ?cyst. No erythema to suggest infection/abscess though warning signs discussed. No bony tenderness. Advised podiatry evaluation for further treatment.

## 2020-07-08 NOTE — Assessment & Plan Note (Signed)
Chronic LE edema per patient - suspect 2/2 to amlodipine but bp controlled. Cont amlodipine 5 mg if not bothered by edema and hctz 25 mg.

## 2020-07-08 NOTE — Patient Instructions (Signed)
Call if worsening redness, pain, swelling, fevers, chills  #Referral to podiatry I have placed a referral to a specialist for you. You should receive a phone call from the specialty office. Make sure your voicemail is not full and that if you are able to answer your phone to unknown or new numbers.   It may take up to 2 weeks to hear about the referral. If you do not hear anything in 2 weeks, please call our office and ask to speak with the referral coordinator.

## 2020-07-22 ENCOUNTER — Encounter: Payer: Self-pay | Admitting: Podiatry

## 2020-07-22 ENCOUNTER — Other Ambulatory Visit: Payer: Self-pay

## 2020-07-22 ENCOUNTER — Ambulatory Visit (INDEPENDENT_AMBULATORY_CARE_PROVIDER_SITE_OTHER): Payer: Medicare Other | Admitting: Podiatry

## 2020-07-22 DIAGNOSIS — M898X9 Other specified disorders of bone, unspecified site: Secondary | ICD-10-CM | POA: Diagnosis not present

## 2020-07-22 DIAGNOSIS — Q667 Congenital pes cavus, unspecified foot: Secondary | ICD-10-CM | POA: Diagnosis not present

## 2020-07-24 ENCOUNTER — Encounter: Payer: Self-pay | Admitting: Podiatry

## 2020-07-24 NOTE — Progress Notes (Signed)
Subjective:  Patient ID: Jason Solomon, male    DOB: 1940/07/19,  MRN: [id number]  Chief Complaint  Patient presents with  . Callouses    Patient presents today for callus at base of 5th left foot x 1 year.  He says it hasn't bothered him but only once when he had to wear boots.    80 y.o. male presents with the above complaint.  Patient presents with left fifth metatarsal base hypertrophy that has been on for 1 year.  Patient states it has not bothered him only when he wear some type of boots.  He states that is not painful just more dull achiness.  He just wanted get evaluated.  He just wanted to make sure that there is nothing concerning going on.  He does have noticed a more prominent during the last few years.  He denies seeing anyone else prior to seeing me he denies any other acute complaints.  Review of Systems: Negative except as noted in the HPI. Denies N/V/F/Ch.  Past Medical History:  Diagnosis Date  . 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 COPD (chronic obstructive pulmonary disease) ([place])   . DIVERTICULOSIS, COLON 01/12/2007  . 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 GERD (gastroesophageal reflux disease)   . GOITER, MULTINODULAR 05/04/2010  . GOUT 01/12/2007  . Heart murmur    childhood only  . HYPERCHOLESTEROLEMIA 06/20/2007  . HYPERTENSION 01/12/2007  . HYPERTHYROIDISM 04/24/2010  . OBESITY 01/12/2007  . PROSTATE SPECIFIC ANTIGEN, ELEVATED 10/08/2009  . Renal calculi   . Shortness of breath dyspnea    with exertion    Current Outpatient Medications:  .  allopurinol (ZYLOPRIM) 300 MG tablet, TAKE 1 TABLET BY MOUTH  DAILY, Disp: 90 tablet, Rfl: 3 .  amLODipine (NORVASC) 5 MG tablet, Take 1 tablet (5 mg total) by mouth daily., Disp: 90 tablet, Rfl: 2 .  ANORO ELLIPTA 62.5-25 MCG/INH AEPB, INHALE 1 INHALATION BY  MOUTH INTO THE LUNGS IN THE MORNING, Disp: 180 each, Rfl: 0 .  aspirin EC 81 MG tablet, Take 1 tablet (81 mg total) by mouth daily. Swallow  whole., Disp: 90 tablet, Rfl: 3 .  atorvastatin (LIPITOR) 20 MG tablet, TAKE 1 TABLET BY MOUTH  DAILY, Disp: 90 tablet, Rfl: 3 .  Calcium Carb-Cholecalciferol (CALCIUM 1000 + D PO), Take by mouth., Disp: , Rfl:  .  fexofenadine (ALLEGRA) 180 MG tablet, Take 180 mg by mouth daily., Disp: , Rfl:  .  hydrochlorothiazide (HYDRODIURIL) 25 MG tablet, TAKE 1 TABLET BY MOUTH  DAILY, Disp: 90 tablet, Rfl: 3 .  levothyroxine (SYNTHROID) 137 MCG tablet, TAKE 1 TABLET BY MOUTH  DAILY BEFORE BREAKFAST, Disp: 90 tablet, Rfl: 3 .  mometasone (NASONEX) 50 MCG/ACT nasal spray, Place 2 sprays into the nose as needed., Disp: 17 g, Rfl: 0 .  polyethylene glycol (MIRALAX / GLYCOLAX) packet, Take 17 g by mouth daily as needed., Disp: , Rfl:  .  terazosin (HYTRIN) 5 MG capsule, TAKE 1 CAPSULE BY MOUTH  DAILY, Disp: 90 capsule, Rfl: 3 .  ULTRAVATE 0.05 % LOTN, APPLY ON THE SKIN DAILY. AVOID FACE, GROIN, AND UNDERARMS, Disp: 60 mL, Rfl: 0  Social History   Tobacco Use  Smoking Status Former Smoker  . Packs/day: 2.00  . Years: 30.00  . Pack years: 60.00  . Quit date: 10  . Years since quitting: 32.4  Smokeless Tobacco Never Used    Allergies  Allergen Reactions  . Ace Inhibitors  REACTION: ?angioedema  . Angiotensin Receptor Blockers     REACTION: angioedema on ACE?   Objective:  There were no vitals filed for this visit. There is no height or weight on file to calculate BMI. Constitutional Well developed. Well nourished.  Vascular Dorsalis pedis pulses palpable bilaterally. Posterior tibial pulses palpable bilaterally. Capillary refill normal to all digits.  No cyanosis or clubbing noted. Pedal hair growth normal.  Neurologic Normal speech. Oriented to person, place, and time. Epicritic sensation to light touch grossly present bilaterally.  Dermatologic Nails well groomed and normal in appearance. No open wounds. No skin lesions.  Orthopedic:  Gait examination shows cavovarus foot structure  with slight varus rotation of the forefoot on the hindfoot.  Coleman block test suggest forefoot cavus.  Hypertrophy of the fifth metatarsal base noted.  No cyst noted.  No peroneal tendinitis noted.  No pain on palpation to the hypertrophy.  Hyperkeratotic lesion noted at the fifth metatarsal base very mild in nature   Radiographs: None Assessment:   1. Bony exostosis   2. Pes cavus    Plan:  Patient was evaluated and treated and all questions answered.  Left fifth metatarsal bone exostosis/hypertrophy with underlying hyperkeratotic lesion/callus -I explained the patient the etiology of hypertrophy and very treatment options were discussed.  I discussed with the patient that this happens over time with adaptation of the foot and with compensation of the cavus foot structure leading to more prominent fifth metatarsal base.  At this time patient does not have any pain with it we will hold off on any treatment options.  He does not have any peroneal tendinitis as well.  I discussed with him that if any foot and ankle issues were in future come back and see me right away.  I did discuss shoe gear modification extensive detail.  No follow-ups on file.

## 2020-08-04 NOTE — Telephone Encounter (Signed)
Not on current medication list.  Okay to refill? 

## 2020-08-05 MED ORDER — MONTELUKAST SODIUM 10 MG PO TABS: 10.0000 mg | ORAL_TABLET | Freq: Every day | ORAL | 0 refills | Status: DC

## 2020-08-11 ENCOUNTER — Ambulatory Visit (INDEPENDENT_AMBULATORY_CARE_PROVIDER_SITE_OTHER): Payer: Medicare Other

## 2020-08-11 ENCOUNTER — Other Ambulatory Visit: Payer: Self-pay

## 2020-08-11 ENCOUNTER — Ambulatory Visit (INDEPENDENT_AMBULATORY_CARE_PROVIDER_SITE_OTHER): Payer: Medicare Other | Admitting: Physician Assistant

## 2020-08-11 ENCOUNTER — Encounter: Payer: Self-pay | Admitting: Physician Assistant

## 2020-08-11 VITALS — BP 142/88 | HR 46 | Ht 72.0 in | Wt 267.0 lb

## 2020-08-11 DIAGNOSIS — R002 Palpitations: Secondary | ICD-10-CM | POA: Diagnosis not present

## 2020-08-11 DIAGNOSIS — I77819 Aortic ectasia, unspecified site: Secondary | ICD-10-CM

## 2020-08-11 DIAGNOSIS — I2510 Atherosclerotic heart disease of native coronary artery without angina pectoris: Secondary | ICD-10-CM | POA: Diagnosis not present

## 2020-08-11 DIAGNOSIS — E785 Hyperlipidemia, unspecified: Secondary | ICD-10-CM | POA: Diagnosis not present

## 2020-08-11 DIAGNOSIS — R001 Bradycardia, unspecified: Secondary | ICD-10-CM

## 2020-08-11 DIAGNOSIS — I10 Essential (primary) hypertension: Secondary | ICD-10-CM | POA: Diagnosis not present

## 2020-08-11 DIAGNOSIS — I440 Atrioventricular block, first degree: Secondary | ICD-10-CM | POA: Diagnosis not present

## 2020-08-11 DIAGNOSIS — I491 Atrial premature depolarization: Secondary | ICD-10-CM | POA: Diagnosis not present

## 2020-08-11 DIAGNOSIS — I281 Aneurysm of pulmonary artery: Secondary | ICD-10-CM | POA: Diagnosis not present

## 2020-08-11 DIAGNOSIS — I700 Atherosclerosis of aorta: Secondary | ICD-10-CM | POA: Diagnosis not present

## 2020-08-11 DIAGNOSIS — R0600 Dyspnea, unspecified: Secondary | ICD-10-CM | POA: Diagnosis not present

## 2020-08-11 DIAGNOSIS — R600 Localized edema: Secondary | ICD-10-CM

## 2020-08-11 NOTE — Patient Instructions (Signed)
Medication Instructions:  Your physician recommends that you continue on your current medications as directed. Please refer to the Current Medication list given to you today.  *If you need a refill on your cardiac medications before your next appointment, please call your pharmacy*   Lab Work:  CBC, BMET, TSH today  If you have labs (blood work) drawn today and your tests are completely normal, you will receive your results only by: [place] (if you have MyChart) OR A paper copy in the mail If you have any lab test that is abnormal or we need to change your treatment, we will call you to review the results.   Testing/Procedures:  1) Your physician has requested that you have an echocardiogram AFTER you wear heart monitor. Echocardiography is a painless test that uses sound waves to create images of your heart. It provides your doctor with information about the size and shape of your heart and how well your heart's chambers and valves are working. This procedure takes approximately one hour. There are no restrictions for this procedure.  2) Your physician has recommended that you wear a ZioXT monitor for TWO WEEKS.   This monitor is a medical device that records the heart's electrical activity. Doctors most often use these monitors to diagnose arrhythmias. Arrhythmias are problems with the speed or rhythm of the heartbeat. The monitor is a small device applied to your chest. You can wear one while you do your normal daily activities. While wearing this monitor if you have any symptoms to push the button and record what you felt. Once you have worn this monitor for the period of time provider prescribed (Usually 14 days), you will return the monitor device in the postage paid box. Once it is returned they will download the data collected and provide [place] with a report which the provider will then review and we will call you with those results. Important tips:  Avoid showering during the  first 24 hours of wearing the monitor. Avoid excessive sweating to help maximize wear time. Do not submerge the device, no hot tubs, and no swimming pools. Keep any lotions or oils away from the patch. After 24 hours you may shower with the patch on. Take brief showers with your back facing the shower head.  Do not remove patch once it has been placed because that will interrupt data and decrease adhesive wear time. Push the button when you have any symptoms and write down what you were feeling. Once you have completed wearing your monitor, remove and place into box which has postage paid and place in your outgoing mailbox.  If for some reason you have misplaced your box then call our office and we can provide another box and/or mail it off for you.     Follow-Up: At [facility], you and your health needs are our priority.  As part of our continuing mission to provide you with exceptional heart care, we have created designated Provider Care Teams.  These Care Teams include your primary Cardiologist (physician) and Advanced Practice Providers (APPs -  Physician Assistants and Nurse Practitioners) who all work together to provide you with the care you need, when you need it.  We recommend signing up for the patient portal called "MyChart".  Sign up information is provided on this After Visit Summary.  MyChart is used to connect with patients for Virtual Visits (Telemedicine).  Patients are able to view lab/test results, encounter notes, upcoming appointments, etc.  Non-urgent messages can  be sent to your provider as well.   To learn more about what you can do with MyChart, go to [email or web address].    Your next appointment:   6 week(s) after heart monitor   The format for your next appointment:   In Person  Provider:   You may see Dr. Saunders Revel or one of the following Advanced Practice Providers on your designated Care Team:    Marrianne Mood, PA-C  Other Instructions  Call our  office if you have any dizziness, chest pain, or worsening shortness of breath [phone]

## 2020-08-11 NOTE — Progress Notes (Signed)
Office Visit    [name] Date of Encounter: 08/13/2020  PCP:  Jinny Sanders, MD   [place]  Cardiologist:  Nelva Bush, MD  Advanced Practice Provider:  No care team member to display Electrophysiologist:  None :[id number]}   Chief Complaint    Chief Complaint  Patient presents with   OTHER    Patient c/o having some flutters in his chest. Medications verbally reviewed with patient.     80 y.o. 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  Past Medical History    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   COPD (chronic obstructive pulmonary disease) ([place])    DIVERTICULOSIS, COLON 01/12/2007   EUSTACHIAN TUBE DYSFUNCTION, BILATERAL 12/28/2006   GERD (gastroesophageal reflux disease)    GOITER, MULTINODULAR 05/04/2010   GOUT 01/12/2007   Heart murmur    childhood only   HYPERCHOLESTEROLEMIA 06/20/2007   HYPERTENSION 01/12/2007   HYPERTHYROIDISM 04/24/2010   OBESITY 01/12/2007   PROSTATE SPECIFIC ANTIGEN, ELEVATED 10/08/2009   Renal calculi    Shortness of breath dyspnea    with exertion   Past Surgical History:  Procedure Laterality Date   CARDIOVASCULAR STRESS TEST  2006   negative   HAND SURGERY  1949   left hand laceration   HERNIA REPAIR  1990's   right and left inguinal hernia   KNEE SURGERY  1990's   TONSILLECTOMY  1945    Allergies  Allergies  Allergen Reactions   Ace Inhibitors     REACTION: ?angioedema   Angiotensin Receptor Blockers     REACTION: angioedema on ACE?    History of Present Illness    Jason Solomon is a 80 y.o.  male with PMH as above.  He has history of CAD by coronary CTA 08/2018, COPD, hypertension, hyperlipidemia, GERD, obesity, and angioedema thought 2/2 ACE inhibitor.  He underwent 2017 echo stress test with frequent PVCs and PACs at rest and during stress.  EKG without ST/T changes concerning for ischemia and echo normal during stress.  Significant ectopy was noted throughout the study with recommendation to consider a Holter monitor if clinically indicated.  2019 Lexiscan overall without significant ischemia and EF 41% with decreased tracer activity/possible scar as below.  Recommendation was for echo to fully evaluate wall motion and LVEF.  Overall, intermediate risk study.  2019 echo with EF 55 to 60%, NR WMA, G1 DD, mild concentric hypertrophy, mild MR, mild LAE/RAE.  He underwent 2020 home sleep study with mild obstructive sleep apnea and recommendation for CPAP.  Average heart rate during sleep was 47 bpm.  2020 coronary CTA with coronary calcium score of 1093, placing him in the 77th percentile for age and sex matched control.  Moderate CAD was noted in the proximal and mid LAD, mid left circumflex, and distal RCA.  The ascending aorta was at the upper limits of normal with maximum diameter of 41 mm.  Also noted was dilated pulmonary artery at 47 mm and consistent with pulmonary hypertension.  Additional analysis with FFR was performed and not consistent with significant stenosis.  Aggressive medical management was recommended.  He  was last seen by his primary cardiologist 04/16/2020.  He reported stable exertional dyspnea and BP was noted to be mildly elevated but typically better at home.  He was able to do his normal activities at home and exercise/play golf.  He denied any chest pain or palpitations.  Lower extremity edema was mild and stable.  Home SBP typically 130s and DBP 80s.  He was working to minimize his sodium intake.  He was not taking ASA after conversation with Dr.  Diona Browner.  Previous clinic EKG with first-degree AV block, 55 bpm, LAD, and PACs.  Today, 08/11/2020, he presents to clinic with his wife and report of chest fluttering or palpitations x1 day.  He reports that this is a new finding with the first episode yesterday 08/10/2020.  The day before, he had been to the golf [place] as per usual without significant symptoms.  No clear triggers identified for onset of tachypalpitations.  He notes tachypalpitations last for 20-30 seconds and then dissipate without clear alleviating factors.  He reports 1 episode lasting up to a minute.  He is significantly briefly bradycardic today at 46 bpm with patient report that this is his usual rate and at home he is normally in the low 50s.  No presyncope or syncope.  No recent falls.  He is still monitoring his BP at home with BP typically 134/78.  Highest SBP 144.  Highest DBP 88 and typically stays below 90s.  Home weight varies 10 pounds between 260 and 270 pounds.  He reports his last home weight was 261 pounds.  His wife expresses concern regarding his ongoing exertional dyspnea, even if symptoms are stable and he is able to do his usual activities including exercise and play golf.  She reports that they do not add any salt to his food, though she has noticed salt in preprepped meals and is working to avoid this/cut this salt from there diet moving forward.  No Cp. Bilateral edema noted on exam with patient report that he continues to notice edema at home that is better in the morning and worsens throughout the day.  He reports bloating with unclear association with food and wife report that this may be associated with bowel movements.  Sticky stools reported but otherwise no other signs or symptoms of bleeding.  He reports an upcoming trip to [place] from June 30 to July 15 and is hoping to work around this trip in regards to work-up and follow-up.  Home Medications   Current Outpatient Medications  Medication  Instructions   allopurinol (ZYLOPRIM) 300 MG tablet TAKE 1 TABLET BY MOUTH  DAILY   amLODipine (NORVASC) 5 mg, Oral, Daily   ANORO ELLIPTA 62.5-25 MCG/INH AEPB INHALE 1 INHALATION BY  MOUTH INTO THE LUNGS IN THE MORNING   aspirin EC 81 mg, Oral, Daily, Swallow whole.   atorvastatin (LIPITOR) 20 MG tablet TAKE 1 TABLET BY MOUTH  DAILY   fexofenadine (ALLEGRA) 180 mg, Oral, Daily   hydrochlorothiazide (HYDRODIURIL) 25 MG tablet TAKE 1 TABLET BY MOUTH  DAILY   levothyroxine (SYNTHROID) 137 MCG tablet TAKE 1 TABLET BY MOUTH  DAILY BEFORE BREAKFAST   polyethylene glycol (MIRALAX / GLYCOLAX) 17 g, Oral, Daily PRN   terazosin (HYTRIN) 5 MG capsule TAKE 1 CAPSULE BY MOUTH  DAILY     Review of Systems    He denies chest pain, pnd, orthopnea, n, v, dizziness, syncope, or early satiety.  He reports tachypalpitations x1 day and dyspnea (stable).  He reports edema.  Weight fluctuates between a 10 pound range as above.  He reports abdominal bloating with unclear association with food and possibly associated with bowel movements.  He reports sticky stools..   All other systems reviewed and are otherwise negative except as noted above.  Physical Exam    VS:  BP (!) 142/88 (BP Location: Left Arm, Patient Position: Sitting, Cuff Size: Normal)   Pulse (!) 46   Ht 6' (1.829 m)   Wt 267 lb (121.1 kg)   SpO2 95%   BMI 36.21 kg/m  , BMI Body mass index is 36.21 kg/m. GEN: Well nourished, well developed, in no acute distress.  Joined by his wife. HEENT: normal. Neck: Supple, no JVD, carotid bruits, or masses. Cardiac: Bradycardic but regular occasional extrasystole, 2/6 systolic murmur.no rubs, or gallops. No clubbing, cyanosis, moderate bilateral edema.  Radials/DP/PT 2+ and equal bilaterally.  Respiratory:  Respirations regular and unlabored, clear to auscultation bilaterally. GI: Soft, nontender, nondistended, BS + x 4. MS: no deformity or atrophy. Skin: warm and dry, no rash. Neuro:  Strength and  sensation are intact. Psych: Normal affect.  Accessory Clinical Findings    ECG personally reviewed by me today - SB, 46 bpm with first degree AVB and baseline wander I, III- no acute changes.  VITALS Reviewed today   Temp Readings from Last 3 Encounters:  07/08/20 97.8 F (36.6 C) (Temporal)  01/31/20 98.1 F (36.7 C) (Temporal)  06/13/19 (!) 97.3 F (36.3 C) (Temporal)   BP Readings from Last 3 Encounters:  08/11/20 (!) 142/88  07/08/20 120/82  06/17/20 (!) 146/80   Pulse Readings from Last 3 Encounters:  08/11/20 (!) 46  07/08/20 76  06/17/20 (!) 55    Wt Readings from Last 3 Encounters:  08/11/20 267 lb (121.1 kg)  07/08/20 263 lb (119.3 kg)  06/17/20 264 lb 6.4 oz (119.9 kg)     LABS  reviewed today   Lab Results  Component Value Date   WBC 3.6 08/11/2020   HGB 14.4 08/11/2020   HCT 44.0 08/11/2020   MCV 92 08/11/2020   PLT 179 08/11/2020   Lab Results  Component Value Date   CREATININE 1.06 08/11/2020   BUN 13 08/11/2020   NA 142 08/11/2020   K 4.0 08/11/2020   CL 104 08/11/2020   CO2 22 08/11/2020   Lab Results  Component Value Date   ALT 43 01/24/2020   AST 30 01/24/2020   ALKPHOS 69 01/24/2020   BILITOT 1.1 01/24/2020   Lab Results  Component Value Date   CHOL 121 01/24/2020   HDL 50.30 01/24/2020   LDLCALC 55 01/24/2020   TRIG 81.0 01/24/2020   CHOLHDL 2 01/24/2020    No results found for: HGBA1C Lab Results  Component Value Date   TSH 1.670 08/11/2020     STUDIES/PROCEDURES reviewed today   Coronary CTA IMPRESSION: 1. Coronary calcium score of 1093. This was 1 percentile for age and sex matched control. 2. Normal coronary origin with right dominance. 3. Moderate CAD in the proximal and mid LAD, mid LCX and distal RCA. Aggressive medical management is recommended. Additional analysis with CT FFR are recommended. 4. Upper normal size of the ascending aorta (for age/sex) with maximum diameter 41 mm. 5. Dilated pulmonary  artery measuring 47 mm consistent with pulmonary hypertension.  Echo 12/07/2018  1. Left ventricular ejection fraction, by visual estimation, is 55 to  60%. The left ventricle has normal function. Normal left ventricular size.  There is no left ventricular  hypertrophy.   2. Left ventricular diastolic Doppler parameters are consistent with  impaired relaxation pattern of LV diastolic filling.   3. Global right ventricle has normal systolic function.The right  ventricular size is normal. Right vetricular wall thickness was not  assessed.   4. Left atrial size was mildly dilated.   5. Right atrial size was normal.   6. The mitral valve is normal in structure. Trace mitral valve  regurgitation.   7. The tricuspid valve is normal in structure. Tricuspid valve  regurgitation was not visualized by color flow Doppler.   8. The aortic valve is tricuspid Aortic valve regurgitation was not  visualized by color flow Doppler.   9. The pulmonic valve was not well visualized. Pulmonic valve  regurgitation is trivial by color flow Doppler.  10. Normal pulmonary artery systolic pressure.  11. The inferior vena cava is normal in size with greater than 50%  respiratory variability, suggesting right atrial pressure of 3 mmHg.   NM study 10/2017 Nuclear st Stress EF: 41%. There was no ST segment deviation noted during stress. Decreased tracer activity in the inferior, basal inferoseptal, basal inferolateral and apical walls consistent with soft tissue attenuation and possible scar No signficant ischemia Recommend echo to fully evaluate wall motion, LVEF This is an intermediate risk study.  Echo stress Study Conclusions  - Stress ECG conclusions: Frequent PVCs and APCs noted at rest and    during stress. The stress ECG was normal with no significant ST    changes concerning for ischemia.  - Staged echo: Normal echo stress  Impressions:  - Normal study after maximal exercise. Significant ectopy     throughout the study including PVCs and APCs. Consider a holter    monitor if clinically indicated.   Assessment & Plan    Sinus bradycardia First-degree AV block History of PACs and PVCs --Reports tachypalpitations x1 day.  Denies any clear triggers or alleviating factors with longest lasting episode up to 1 minute and usually only lasting seconds.  2017 echo stress with frequent ectopy.  Previous 2019 stress ruled intermediate risk and subsequent echo with EF 55 to 60%, NR WMA.  EKG today stable with first-degree AV block.  He is not on any AV nodal blocking agents with current rate today 46 bpm.  Continue to defer the addition of beta-blocker or nondihydropyridine calcium channel blocker today as previously noted.  Check BMET to ensure electrolytes at goal on HCTZ, CBC given report of sticky stools today, and TSH given known hypothyroidism.  Recommend Zio XT x2 weeks placed today for further evaluation of palpitations and to quantify burden of ectopy and rule out significant arrhythmia.  We will also get an echo as below to reassess EF and wall motion/structure. Tx of OSA on home sleep study recommended.  Coronary artery disease --Reports stable exertional dyspnea without angina.  Cardiac CTA 08/2018 with moderate multivessel CAD that was not significant by CT FFR.  Given his increased tachypalpitations, we will repeat an echo to reassess EF and wall motion, as well as rule out any acute structural changes.  Continue medical management with ASA and atorvastatin.  Discussed increasing his amlodipine in the future, given his pressure today.  Will defer need for now and reassess pressure at RTC.  If EF reduced by echo /WMA, further ischemic work-up should be considered with [facility] or catheterization at that time. RF modification recommended. Increase activity as tolerated. Lifestyle changes discussed, including tx of any sleep apnea.  Essential hypertension,  goal BP 130/80 --BP today  suboptimally controlled.  We discussed sodium restriction with wife reporting that they will work on eliminating it from preprocessed foods.  Also considered is OSA s/p 2020 sleep study as above, which can contribute to elevated BP as well as his pulmonary hypertension/dilated pulmonary artery with tx recommended.  He is currently on amlodipine 5 mg daily and HCTZ 25 mg daily for BP support.  We did discuss increasing this to amlodipine 10 mg daily, though preference is to hold off on this increase given upcoming trip to [place] and until able to confirm BP consistently elevated above 130/80.  Avoid ACE/ARB given previous report of angioedema as above.  Recommended home BP monitoring--call the office if BP consistently elevated over 130/80.   Hyperlipidemia, LDL goal below 70 --Continue current statin.  LDL goal below 70.  Sticky stools --Check CBC for patient report of sticky stools and to ensure stable H&H on ASA.  No other signs or symptoms reported consistent with bleeding.  Ascending aorta dilation (41 mm, 08/2018) --Dilation and aortic atherosclerosis seen on 08/2018 coronary CTA at 41 mm.  Recommend heart rate and BP control, as well as control of LDL.  Continue ASA and statin.  Avoid fluoroquinolones.  Avoid heavy lifting.  As above, we will repeat an echo, which will allow [place] to monitor the size of this dilation.  Hypothyroidism --Continue Synthroid.  Will check TSH today given palpitations.  Medication changes: None Labs ordered: CBC, BMET, TSH Studies / Imaging ordered: Echo, ZIO XT x2 weeks Future considerations: Pending echo, Lexiscan or cath.  Escalation of amlodipine or HCTZ for BP control? Disposition: RTC after Zio XT x2 weeks  *Please be aware that the above documentation was completed voice recognition software and may contain dictation errors.      Arvil Chaco, PA-C

## 2020-08-12 LAB — CBC
Hematocrit: 44 % (ref 37.5–51.0)
Hemoglobin: 14.4 g/dL (ref 13.0–17.7)
MCH: 30.3 pg (ref 26.6–33.0)
MCHC: 32.7 g/dL (ref 31.5–35.7)
MCV: 92 fL (ref 79–97)
Platelets: 179 10*3/uL (ref 150–450)
RBC: 4.76 x10E6/uL (ref 4.14–5.80)
RDW: 12.1 % (ref 11.6–15.4)
WBC: 3.6 10*3/uL (ref 3.4–10.8)

## 2020-08-12 LAB — BASIC METABOLIC PANEL
BUN/Creatinine Ratio: 12 (ref 10–24)
BUN: 13 mg/dL (ref 8–27)
CO2: 22 mmol/L (ref 20–29)
Calcium: 9.8 mg/dL (ref 8.6–10.2)
Chloride: 104 mmol/L (ref 96–106)
Creatinine, Ser: 1.06 mg/dL (ref 0.76–1.27)
Glucose: 85 mg/dL (ref 65–99)
Potassium: 4 mmol/L (ref 3.5–5.2)
Sodium: 142 mmol/L (ref 134–144)
eGFR: 71 mL/min/{1.73_m2} (ref >59)

## 2020-08-12 LAB — TSH: TSH: 1.67 u[IU]/mL (ref 0.450–4.500)

## 2020-08-24 ENCOUNTER — Other Ambulatory Visit: Payer: Self-pay | Admitting: Pulmonary Disease

## 2020-09-04 DIAGNOSIS — R002 Palpitations: Secondary | ICD-10-CM | POA: Diagnosis not present

## 2020-09-12 MED ORDER — ULTRAVATE 0.05 % EX LOTN: TOPICAL_LOTION | CUTANEOUS | 0 refills | Status: AC

## 2020-09-16 ENCOUNTER — Telehealth: Payer: Self-pay | Admitting: Internal Medicine

## 2020-09-16 ENCOUNTER — Telehealth: Payer: Self-pay | Admitting: *Deleted

## 2020-09-16 DIAGNOSIS — R9431 Abnormal electrocardiogram [ECG] [EKG]: Secondary | ICD-10-CM

## 2020-09-16 NOTE — Telephone Encounter (Signed)
Spoke to pt. Notified of event monitor results and provider's recc.  Pt voiced understanding.  Pt is agreeable to EP referral.  Pt does have echo scheduled 10/02/20 he will proceed with.  Forwarding to scheduling to set up EP consult.  Pt has no further questions at this time.

## 2020-09-16 NOTE — Telephone Encounter (Signed)
Please see subsequent message regarding monitor results.

## 2020-09-16 NOTE — Telephone Encounter (Signed)
-----   Message from Arvil Chaco, PA-C sent at 09/16/2020  1:20 PM EDT ----- Monitor showed --Normal heart rhythm on average 65bpm.  -- Frequent PACs /extra beats from the top part of the heart. --Occasional PVCs /extra beats from the bottom part of the heart. --92 episodes of a faster heart rhythm from the bottom chambers of the heart --1541 faster heart rhythms, top part of the heart.  Recommend the patient follow-up with electrophysiology . Please set the patient up for an appointment with the EP, if agreeable.

## 2020-09-16 NOTE — Telephone Encounter (Signed)
Please call to review monitor results. Patient requests a call this afternoon

## 2020-09-19 MED ORDER — ANORO ELLIPTA 62.5-25 MCG/INH IN AEPB: INHALATION_SPRAY | RESPIRATORY_TRACT | 0 refills | Status: DC

## 2020-09-30 ENCOUNTER — Ambulatory Visit: Payer: Medicare Other | Admitting: Family Medicine

## 2020-10-02 ENCOUNTER — Other Ambulatory Visit: Payer: Self-pay

## 2020-10-02 ENCOUNTER — Ambulatory Visit (INDEPENDENT_AMBULATORY_CARE_PROVIDER_SITE_OTHER): Payer: Medicare Other

## 2020-10-02 DIAGNOSIS — R002 Palpitations: Secondary | ICD-10-CM

## 2020-10-02 DIAGNOSIS — R0600 Dyspnea, unspecified: Secondary | ICD-10-CM

## 2020-10-03 LAB — ECHOCARDIOGRAM COMPLETE
AR max vel: 3.82 cm2
AV Area VTI: 4.03 cm2
AV Area mean vel: 3.64 cm2
AV Mean grad: 3 mmHg
AV Peak grad: 5.5 mmHg
Ao pk vel: 1.17 m/s
Area-P 1/2: 2.55 cm2
Calc EF: 50.7 %
S' Lateral: 3.7 cm
Single Plane A2C EF: 46 %
Single Plane A4C EF: 54.2 %

## 2020-10-10 ENCOUNTER — Other Ambulatory Visit: Payer: Self-pay

## 2020-10-10 ENCOUNTER — Encounter: Payer: Self-pay | Admitting: Family Medicine

## 2020-10-10 ENCOUNTER — Ambulatory Visit (INDEPENDENT_AMBULATORY_CARE_PROVIDER_SITE_OTHER): Payer: Medicare Other | Admitting: Family Medicine

## 2020-10-10 VITALS — Ht 72.0 in

## 2020-10-10 DIAGNOSIS — I10 Essential (primary) hypertension: Secondary | ICD-10-CM

## 2020-10-12 NOTE — Progress Notes (Deleted)
Follow-up Outpatient Visit Date: 10/15/2020  Primary Care Provider: Jinny Sanders, MD [place] [address]  Chief Complaint: ***  HPI:  Jason Solomon is a 80 y.o. 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  He was last seen in our office in June by Marrianne Mood, PA, for evaluation of palpitations.  Subsequent event monitor showed frequent PACs and occasional PVCs as well as multiple episodes of NSVT and PSVT.  Subsequent echo showed normal LVEF with grade 1 diastolic dysfunction, mild left atrial enlargement, and no significant valvular abnormality.  --------------------------------------------------------------------------------------------------  Cardiovascular History & Procedures: Cardiovascular Problems: Shortness of breath Nonobstructive coronary artery disease PSVT and NSVT   Risk Factors: Moderate coronary artery disease, hypertension, hyperlipidemia, obesity, male gender, and age greater than 47   Cath/PCI: None   CV Surgery: None   EP Procedures and Devices: 14-day event monitor (08/11/2020): Predominantly sinus rhythm with frequent PACs and occasional PVCs as well as multiple episodes of NSVT (92 episodes lasting up to 11 beats) and PSVT (1,541 episodes lasting up to 1.5 minutes).   Non-Invasive Evaluation(s): TTE (10/02/2020): Normal LV size and wall thickness.  LVEF 55-6% with grade 1 diastolic dysfunction.  Mildly dilated right ventricle with normal wall thickness and contraction.  Mild left atrial enlargement.  No significant valvular abnormality. Cardiac CTA (09/01/2018): LMCA normal.  LAD with 50-69% proximal and mid vessel  disease.  LCx with 60-69% stenosis in midvessel.  Dominant RCA with diffuse 25-49% stenosis in the proximal and mid vessel.  Distal RCA with 50-69% calcified plaque.  CTFFR is not significant in any vessel.  Coronary calcium score 1,093 Agatston units (77 percentile for age/sex).  Aortic atherosclerosis noted. TTE (11/18/2017): Normal LV size with mild LVH.  LVEF 55-60% with normal wall motion and grade 1 diastolic dysfunction.  Mild MR.  Mild biatrial enlargement.  Normal RV size and function.  Normal PA pressure. Pharmacologic myocardial perfusion stress test (10/31/2017): Intermediate risk study with fixed defect involving the inferior, inferolateral, and apical segments thought to be artifact versus scar.  No ischemia was seen.  LVEF 41%. Exercise stress echo (03/19/2015): Normal study without ischemia.  Frequent PAC's and PVC's noted at rest and during stress.  Recent CV Pertinent Labs: Lab Results  Component Value Date   CHOL 121 01/24/2020   HDL 50.30 01/24/2020   LDLCALC 55 01/24/2020   TRIG 81.0 01/24/2020   CHOLHDL 2 01/24/2020   K 4.0 08/11/2020   MG 2.0 01/16/2014   BUN 13 08/11/2020   CREATININE 1.06 08/11/2020    Past medical and surgical history were reviewed and updated in EPIC.  No outpatient medications have been marked as taking for the 10/15/20 encounter (Appointment) with Terrez Ander, Harrell Gave, MD.    Allergies: Ace inhibitors, Angiotensin receptor blockers, and Quinolones  Social History   Tobacco Use   Smoking status: Former    Packs/day: 2.00    Years: 30.00    Pack years: 60.00    Types: Cigarettes    Quit date: 1990    Years since quitting: 32.6   Smokeless tobacco: Never  Vaping Use   Vaping Use: Never used  Substance Use Topics   Alcohol use: Yes  Alcohol/week: 5.0 standard drinks    Types: 5 Glasses of wine per week   Drug use: No    Family History  Problem Relation Age of Onset   Hypertension Mother    COPD Brother    Lung cancer Brother 41    Throat cancer Brother    Hypertension Father    Stroke Father    Cancer Maternal Grandmother        unknown   Thyroid disease Neg Hx     Review of Systems: A 12-system review of systems was performed and was negative except as noted in the HPI.  --------------------------------------------------------------------------------------------------  Physical Exam: There were no vitals taken for this visit.  General:  NAD. Neck: No JVD or HJR. Lungs: Clear to auscultation bilaterally without wheezes or crackles. Heart: Regular rate and rhythm without murmurs, rubs, or gallops. Abdomen: Soft, nontender, nondistended. Extremities: No lower extremity edema.  EKG:  ***  Lab Results  Component Value Date   WBC 3.6 08/11/2020   HGB 14.4 08/11/2020   HCT 44.0 08/11/2020   MCV 92 08/11/2020   PLT 179 08/11/2020    Lab Results  Component Value Date   NA 142 08/11/2020   K 4.0 08/11/2020   CL 104 08/11/2020   CO2 22 08/11/2020   BUN 13 08/11/2020   CREATININE 1.06 08/11/2020   GLUCOSE 85 08/11/2020   ALT 43 01/24/2020    Lab Results  Component Value Date   CHOL 121 01/24/2020   HDL 50.30 01/24/2020   LDLCALC 55 01/24/2020   TRIG 81.0 01/24/2020   CHOLHDL 2 01/24/2020    --------------------------------------------------------------------------------------------------  ASSESSMENT AND PLAN: Harrell Gave Rowe Warman, MD 10/12/2020 1:00 PM

## 2020-10-15 ENCOUNTER — Ambulatory Visit: Payer: Medicare Other | Admitting: Internal Medicine

## 2020-10-16 ENCOUNTER — Telehealth: Payer: Self-pay | Admitting: Internal Medicine

## 2020-10-16 NOTE — Telephone Encounter (Signed)
Arvil Chaco, PA-C  10/03/2020  3:56 PM EDT     MyChart msg sent to pt Echo findings stable.   Recommend BP control (log BP), treatment of OSA with CPAP, follow-up with pulmonology   Good news!  Reassuring echo & relatively unchanged from your previous 2020 study. --Normal heart squeeze. Walls of the heart are moving normally. --Heart walls are slightly stiff, which is often seen with elevated BP, and which was seen in 2020.  --Mild increase in chamber sizes, which is often associated with sleep apnea and COPD.    We will continue to work on BP control. Please continue to log your BP and weight at home. Recommend CPAP use for your sleep apnea. Recommend pulmonology follow-up as directed by Dr. Patsey Berthold (lung doctor) for your COPD.  Written by Arvil Chaco, PA-C on 10/03/2020  3:56 PM EDT Seen by patient Jason Solomon on 10/03/2020  4:24 PM

## 2020-10-16 NOTE — Telephone Encounter (Signed)
I have called the patient and reviewed his echo results with him by phone.  He voices understanding of these results and Jacquelyn's recommendations to follow up with Dr. Patsey Berthold.  All questions were answered. The patient was appreciative for the call back.

## 2020-10-16 NOTE — Telephone Encounter (Signed)
Please call with echocardiogram results.  

## 2020-10-19 ENCOUNTER — Other Ambulatory Visit: Payer: Self-pay

## 2020-10-19 ENCOUNTER — Ambulatory Visit
Admission: RE | Admit: 2020-10-19 | Discharge: 2020-10-19 | Disposition: A | Discharge status: Home / Self Care | Payer: Medicare Other | Source: Ambulatory Visit | Attending: Family Medicine | Admitting: Family Medicine

## 2020-10-19 VITALS — BP 149/85 | HR 57 | Temp 97.4°F | Resp 20 | Ht 73.0 in | Wt 257.0 lb

## 2020-10-19 DIAGNOSIS — R1084 Generalized abdominal pain: Secondary | ICD-10-CM

## 2020-10-19 DIAGNOSIS — R112 Nausea with vomiting, unspecified: Secondary | ICD-10-CM

## 2020-10-19 DIAGNOSIS — K439 Ventral hernia without obstruction or gangrene: Secondary | ICD-10-CM

## 2020-10-19 HISTORY — DX: Hyperlipidemia, unspecified: E78.5

## 2020-10-19 NOTE — ED Provider Notes (Signed)
Jason Solomon    CSN: [id number] Arrival date & time: 10/19/20  4709      History   Chief Complaint Chief Complaint  Patient presents with   Abdominal Pain    HPI Jason Solomon is a 80 y.o. male.  Patient presents with nausea, vomiting, abdominal cramping 3 days ago.  His symptoms have resolved.  Last episode of emesis 2 days ago.  Last bowel movement 2 days ago.  He also reports itching all over but denies rash.  No new products, medications, foods.  He is not currently itching.  He denies fever, chills, current abdominal pain, diarrhea, constipation, dysuria, hematuria, blood in stool, dark stool, or other symptoms.  No treatments attempted.  His medical history includes hypertension, COPD, diverticulosis, chronic back pain.  The history is provided by the patient and medical records.   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   COPD (chronic obstructive pulmonary disease) ([place])    DIVERTICULOSIS, COLON 01/12/2007   EUSTACHIAN TUBE DYSFUNCTION, BILATERAL 12/28/2006   GERD (gastroesophageal reflux disease)    GOITER, MULTINODULAR 05/04/2010   GOUT 01/12/2007   Heart murmur    childhood only   HYPERCHOLESTEROLEMIA 06/20/2007   Hyperlipidemia    HYPERTENSION 01/12/2007   HYPERTHYROIDISM 04/24/2010   OBESITY 01/12/2007   PROSTATE SPECIFIC ANTIGEN, ELEVATED 10/08/2009   Renal calculi    Shortness of breath dyspnea    with exertion    Patient Active Problem List   Diagnosis Date Noted   Skin lesion of foot 07/08/2020   PAC (premature atrial contraction) 04/16/2020   Right foot pain 01/31/2020   Gastroesophageal reflux disease 01/31/2020   First degree AV block 09/26/2019   Hyperlipidemia LDL goal <70 03/15/2019   Coronary artery disease involving native coronary artery of native heart without angina pectoris 10/04/2018   Aortic atherosclerosis ([place]) 08/24/2018   Dyspnea on exertion 07/28/2018   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   Class 1 obesity with serious comorbidity and body mass index (BMI) of 33.0 to 33.9 in adult 01/13/2016   Sinus bradycardia 03/20/2014   Counseling regarding end of life decision making 01/01/2014   Hx of tobacco use, presenting hazards to health 12/28/2012   COPD, mild ([place]) 12/28/2012   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   Hx of Thyroiditis, unspecified 05/26/2010   GOITER, MULTINODULAR 05/04/2010   PROSTATE SPECIFIC ANTIGEN, ELEVATED 10/08/2009   ALLERGIC RHINITIS 11/06/2008   HYPERCHOLESTEROLEMIA 06/20/2007   RENAL CALCULUS, URIC ACID 01/12/2007   Chronic gout 01/12/2007   Essential hypertension 01/12/2007   DIVERTICULOSIS, COLON 01/12/2007   LOW BACK PAIN, CHRONIC 12/21/2006    Past Surgical History:  Procedure Laterality Date   CARDIOVASCULAR STRESS TEST  2006   negative   HAND SURGERY  1949   left hand laceration   HERNIA REPAIR  1990's   right and left inguinal hernia   KNEE SURGERY  1990's   TONSILLECTOMY  1945       Home Medications    Prior to Admission medications   Medication Sig Start Date End Date Taking? Authorizing Provider  allopurinol (ZYLOPRIM) 300 MG tablet TAKE 1 TABLET BY MOUTH  DAILY 02/14/20  Yes Bedsole, Amy E, MD  amLODipine (NORVASC) 5 MG tablet Take 1 tablet (5 mg total) by mouth daily. 04/16/20 10/19/20 Yes End, Harrell Gave, MD  aspirin EC 81 MG tablet Take 1 tablet (81 mg total) by mouth daily. Swallow whole. 04/16/20  Yes End, Harrell Gave, MD  atorvastatin (LIPITOR) 20 MG tablet TAKE 1 TABLET BY MOUTH  DAILY 05/07/20  Yes Bedsole, Amy E, MD  hydrochlorothiazide (HYDRODIURIL) 25 MG tablet TAKE 1 TABLET BY MOUTH  DAILY 03/19/20  Yes Bedsole, Amy E, MD  levothyroxine (SYNTHROID) 137 MCG tablet TAKE 1 TABLET BY MOUTH  DAILY BEFORE BREAKFAST 03/13/20  Yes Renato Shin, MD  terazosin (HYTRIN) 5 MG capsule TAKE 1 CAPSULE BY MOUTH  DAILY 02/14/20  Yes Bedsole, Amy E, MD  umeclidinium-vilanterol (ANORO  ELLIPTA) 62.5-25 MCG/INH AEPB INHALE 1 INHALATION BY  MOUTH INTO THE LUNGS IN THE MORNING 09/19/20  Yes Bedsole, Amy E, MD  fexofenadine (ALLEGRA) 180 MG tablet Take 180 mg by mouth daily.    [provider]  Halobetasol Propionate (ULTRAVATE) 0.05 % LOTN APPLY ON THE SKIN DAILY. AVOID FACE, GROIN, AND UNDERARMS 09/12/20   Bedsole, Amy E, MD  polyethylene glycol (MIRALAX / GLYCOLAX) packet Take 17 g by mouth daily as needed.    [provider]    Family History Family History  Problem Relation Age of Onset   Hypertension Mother    COPD Brother    Lung cancer Brother 72   Throat cancer Brother    Hypertension Father    Stroke Father    Cancer Maternal Grandmother        unknown   Thyroid disease Neg Hx     Social History Social History   Tobacco Use   Smoking status: Former    Packs/day: 2.00    Years: 30.00    Pack years: 60.00    Types: Cigarettes    Quit date: 1990    Years since quitting: 32.6   Smokeless tobacco: Never  Vaping Use   Vaping Use: Never used  Substance Use Topics   Alcohol use: Yes    Alcohol/week: 5.0 standard drinks    Types: 5 Glasses of wine per week   Drug use: No     Allergies   Ace inhibitors, Angiotensin receptor blockers, and Quinolones   Review of Systems Review of Systems  Constitutional:  Negative for chills and fever.  Respiratory:  Negative for cough and shortness of breath.   Cardiovascular:  Negative for chest pain and palpitations.  Gastrointestinal:  Positive for abdominal pain, nausea and vomiting. Negative for blood in stool, constipation and diarrhea.  Genitourinary:  Negative for dysuria and hematuria.  Skin:  Negative for color change and rash.  All other systems reviewed and are negative.   Physical Exam Triage Vital Signs ED Triage Vitals  Enc Vitals Group     BP      Pulse      Resp      Temp      Temp src      SpO2      Weight      Height      Head Circumference      Peak Flow      Pain  Score      Pain Loc      Pain Edu?      Excl. in [place]?    No data found.  Updated Vital Signs BP (!) 149/85 (BP Location: Left Arm)   Pulse (!) 57   Temp (!) 97.4 F (36.3 C) (Oral)   Resp 20   Ht 6\' 1"  (1.854 m)   Wt 257 lb (116.6 kg)   SpO2 92% Comment: Hx COPD  BMI 33.91 kg/m   Visual Acuity Right Eye Distance:   Left Eye  Distance:   Bilateral Distance:    Right Eye Near:   Left Eye Near:    Bilateral Near:     Physical Exam Vitals and nursing note reviewed.  Constitutional:      General: He is not in acute distress.    Appearance: He is well-developed. He is obese. He is not ill-appearing.  HENT:     Head: Normocephalic and atraumatic.     Right Ear: Tympanic membrane normal.     Left Ear: Tympanic membrane normal.     Nose: Nose normal.     Mouth/Throat:     Mouth: Mucous membranes are moist.     Pharynx: Oropharynx is clear.  Eyes:     Conjunctiva/sclera: Conjunctivae normal.  Cardiovascular:     Rate and Rhythm: Normal rate and regular rhythm.     Heart sounds: Normal heart sounds.  Pulmonary:     Effort: Pulmonary effort is normal. No respiratory distress.     Breath sounds: Normal breath sounds.  Abdominal:     General: Bowel sounds are normal. There is no distension.     Palpations: Abdomen is soft.     Tenderness: There is no abdominal tenderness. There is no right CVA tenderness, left CVA tenderness, guarding or rebound.       Comments: Hernia in mid upper abdomen noted when patient moving to lying position; soft and nontender in lying and sitting positions.   Musculoskeletal:     Cervical back: Neck supple.  Skin:    General: Skin is warm and dry.     Findings: No rash.  Neurological:     General: No focal deficit present.     Mental Status: He is alert and oriented to person, place, and time.     Gait: Gait normal.  Psychiatric:        Mood and Affect: Mood normal.        Behavior: Behavior normal.     UC Treatments / Results   Labs (all labs ordered are listed, but only abnormal results are displayed) Labs Reviewed - No data to display  EKG   Radiology No results found.  Procedures Procedures (including critical care time)  Medications Ordered in UC Medications - No data to display  Initial Impression / Assessment and Plan / UC Course  I have reviewed the triage vital signs and the nursing notes.  Pertinent labs & imaging results that were available during my care of the patient were reviewed by me and considered in my medical decision making (see chart for details).  Abdominal pain, nausea, vomiting, hernia of abdominal wall.  Patient is well-appearing and his exam is reassuring.  He is currently asymptomatic.  The hernia noted on his upper abdomen is soft and nontender.  Instructed patient to schedule an appointment with his PCP for evaluation of the hernia and possible referral to specialist.  Education provided on abdominal pain and hernia.  Patient agrees to plan of care.   Final Clinical Impressions(s) / UC Diagnoses   Final diagnoses:  Generalized abdominal pain  Non-intractable vomiting with nausea, unspecified vomiting type  Hernia of abdominal wall     Discharge Instructions      Schedule an appointment with your primary care provider.         ED Prescriptions   None    PDMP not reviewed this encounter.   Sharion Balloon, NP 10/19/20 [phone]

## 2020-10-19 NOTE — ED Triage Notes (Signed)
Abd cramping x3 days, vomited x2 yesterday, denies n/d. Reports "vomit was more mucus then food"  Also reports "itching all over", denies rash, reports skin "very dry". Rash not noted.

## 2020-10-19 NOTE — Discharge Instructions (Addendum)
Schedule an appointment with your primary care provider.

## 2020-10-20 NOTE — Telephone Encounter (Signed)
Jason Solomon was seen at Urgent Care on 10/19/2020.  I have scheduled him an appointment to follow up with Dr. Diona Browner on 10/21/2020 at 9:00 am.

## 2020-10-21 ENCOUNTER — Ambulatory Visit (INDEPENDENT_AMBULATORY_CARE_PROVIDER_SITE_OTHER): Payer: Medicare Other | Admitting: Family Medicine

## 2020-10-21 ENCOUNTER — Other Ambulatory Visit: Payer: Self-pay | Admitting: Family Medicine

## 2020-10-21 ENCOUNTER — Other Ambulatory Visit: Payer: Self-pay

## 2020-10-21 ENCOUNTER — Encounter: Payer: Self-pay | Admitting: Family Medicine

## 2020-10-21 VITALS — BP 112/80 | HR 52 | Temp 98.2°F | Ht 72.0 in | Wt 252.0 lb

## 2020-10-21 DIAGNOSIS — L299 Pruritus, unspecified: Secondary | ICD-10-CM | POA: Diagnosis not present

## 2020-10-21 DIAGNOSIS — I2510 Atherosclerotic heart disease of native coronary artery without angina pectoris: Secondary | ICD-10-CM

## 2020-10-21 DIAGNOSIS — Z23 Encounter for immunization: Secondary | ICD-10-CM

## 2020-10-21 DIAGNOSIS — K439 Ventral hernia without obstruction or gangrene: Secondary | ICD-10-CM | POA: Diagnosis not present

## 2020-10-21 DIAGNOSIS — R748 Abnormal levels of other serum enzymes: Secondary | ICD-10-CM

## 2020-10-21 DIAGNOSIS — R7401 Elevation of levels of liver transaminase levels: Secondary | ICD-10-CM

## 2020-10-21 LAB — IBC + FERRITIN
Ferritin: 474.4 ng/mL — ABNORMAL HIGH (ref 22.0–322.0)
Iron: 149 ug/dL (ref 42–165)
Saturation Ratios: 42.7 % (ref 20.0–50.0)
TIBC: 348.6 ug/dL (ref 250.0–450.0)
Transferrin: 249 mg/dL (ref 212.0–360.0)

## 2020-10-21 LAB — HEPATIC FUNCTION PANEL
ALT: 72 U/L — ABNORMAL HIGH (ref 0–53)
AST: 58 U/L — ABNORMAL HIGH (ref 0–37)
Albumin: 3.6 g/dL (ref 3.5–5.2)
Alkaline Phosphatase: 202 U/L — ABNORMAL HIGH (ref 39–117)
Bilirubin, Direct: 1.7 mg/dL — ABNORMAL HIGH (ref 0.0–0.3)
Total Bilirubin: 3.6 mg/dL — ABNORMAL HIGH (ref 0.2–1.2)
Total Protein: 7.3 g/dL (ref 6.0–8.3)

## 2020-10-21 NOTE — Progress Notes (Signed)
Patient ID: [name], male    DOB: February 02, 1941, 80 y.o.   MRN: [id number]  This visit was conducted in person.  BP 112/80   Pulse (!) 52   Temp 98.2 F (36.8 C) (Temporal)   Ht 6' (1.829 m)   Wt 252 lb (114.3 kg)   SpO2 97%   BMI 34.18 kg/m    CC:  Chief Complaint  Patient presents with   Follow-up    UCB-Abd Pain, N&V, Hernia Abd Wall    Itching all over    Subjective:   HPI: RAYMONDO Solomon is a 80 y.o. male presenting on 10/21/2020 for Follow-up (UCB-Abd Pain, N&V, Hernia Abd Wall ) and Itching all over  He reports he was doing well until 8/25 when he started noting  Nauseand abdominal cramping. He made himself vomit to relieve the pressure.  No fever.. 99.5 one day. No blood in stool.  No diarrhea, regular BMs once daily but did take miralax.  No sick  contacts.  He was seen at Urgent Care and note was reviewed in detail.  Abdominal hernia noted. He had noted the bulging in upper abdomen in last several months, not clearly changing. Does not get sore.  S/P  umbilical hernia and bilateral inguinal hernia repair many years ago.     He has been struggling with dry skin, itchy all over ongoing last several weeks.  No rash. He is outside a lot.  No new detergent changes, no new lotion, no new foods.  No new medications. No sick contacts.   07/2020 normal TSH, cbc, bmet  Relevant past medical, surgical, family and social history reviewed and updated as indicated. Interim medical history since our last visit reviewed. Allergies and medications reviewed and updated. Outpatient Medications Prior to Visit  Medication Sig Dispense Refill   allopurinol (ZYLOPRIM) 300 MG tablet TAKE 1 TABLET BY MOUTH  DAILY 90 tablet 3   amLODipine (NORVASC) 5 MG tablet Take 1 tablet (5 mg total) by mouth daily. 90 tablet 2   aspirin EC 81 MG tablet Take 1 tablet (81 mg total) by mouth daily. Swallow whole. 90 tablet 3   atorvastatin (LIPITOR) 20 MG tablet TAKE 1 TABLET BY  MOUTH  DAILY 90 tablet 3   fexofenadine (ALLEGRA) 180 MG tablet Take 180 mg by mouth daily.     Halobetasol Propionate (ULTRAVATE) 0.05 % LOTN APPLY ON THE SKIN DAILY. AVOID FACE, GROIN, AND UNDERARMS 60 mL 0   hydrochlorothiazide (HYDRODIURIL) 25 MG tablet TAKE 1 TABLET BY MOUTH  DAILY 90 tablet 3   levothyroxine (SYNTHROID) 137 MCG tablet TAKE 1 TABLET BY MOUTH  DAILY BEFORE BREAKFAST 90 tablet 3   omeprazole (PRILOSEC OTC) 20 MG tablet Take 20 mg by mouth daily.     polyethylene glycol (MIRALAX / GLYCOLAX) packet Take 17 g by mouth daily as needed.     terazosin (HYTRIN) 5 MG capsule TAKE 1 CAPSULE BY MOUTH  DAILY 90 capsule 3   umeclidinium-vilanterol (ANORO ELLIPTA) 62.5-25 MCG/INH AEPB INHALE 1 INHALATION BY  MOUTH INTO THE LUNGS IN THE MORNING 90 each 0   No facility-administered medications prior to visit.     Per HPI unless specifically indicated in ROS section below Review of Systems  Constitutional:  Negative for fatigue and fever.  HENT:  Negative for ear pain.   Eyes:  Negative for pain.  Respiratory:  Negative for cough and shortness of breath.   Cardiovascular:  Negative for chest pain, palpitations and  leg swelling.  Gastrointestinal:  Negative for abdominal pain.  Genitourinary:  Negative for dysuria.  Musculoskeletal:  Negative for arthralgias.  Allergic/Immunologic: Positive for environmental allergies. Negative for food allergies and immunocompromised state.  Neurological:  Negative for syncope, light-headedness and headaches.  Psychiatric/Behavioral:  Negative for dysphoric mood.   Objective:  BP 112/80   Pulse (!) 52   Temp 98.2 F (36.8 C) (Temporal)   Ht 6' (1.829 m)   Wt 252 lb (114.3 kg)   SpO2 97%   BMI 34.18 kg/m   Wt Readings from Last 3 Encounters:  10/21/20 252 lb (114.3 kg)  10/19/20 257 lb (116.6 kg)  08/11/20 267 lb (121.1 kg)      Physical Exam Constitutional:      Appearance: He is well-developed. He is obese.     Comments: Central  obesity.. protuberant abdomen.  HENT:     Head: Normocephalic.     Right Ear: Hearing normal.     Left Ear: Hearing normal.     Nose: Nose normal.  Neck:     Thyroid: No thyroid mass or thyromegaly.     Vascular: No carotid bruit.     Trachea: Trachea normal.  Cardiovascular:     Rate and Rhythm: Normal rate and regular rhythm.     Pulses: Normal pulses.     Heart sounds: Heart sounds not distant. No murmur heard.   No friction rub. No gallop.     Comments: No peripheral edema Pulmonary:     Effort: Pulmonary effort is normal. No respiratory distress.     Breath sounds: Normal breath sounds.  Abdominal:     General: Abdomen is protuberant. Bowel sounds are normal. There is no distension.     Palpations: Abdomen is soft.     Tenderness: There is no abdominal tenderness. There is no right CVA tenderness, left CVA tenderness, guarding or rebound.       Comments: Large verbal abdominal wall hernia  Skin:    General: Skin is warm and dry.     Findings: No rash.  Psychiatric:        Speech: Speech normal.        Behavior: Behavior normal.        Thought Content: Thought content normal.      Results for orders placed or performed in visit on 10/02/20  ECHOCARDIOGRAM COMPLETE  Result Value Ref Range   AR max vel 3.82 cm2   AV Peak grad 5.5 mmHg   Ao pk vel 1.17 m/s   S' Lateral 3.70 cm   Area-P 1/2 2.55 cm2   AV Area VTI 4.03 cm2   AV Mean grad 3.0 mmHg   Single Plane A4C EF 54.2 %   Single Plane A2C EF 46.0 %   Calc EF 50.7 %   AV Area mean vel 3.64 cm2    This visit occurred during the SARS-CoV-2 public health emergency.  Safety protocols were in place, including screening questions prior to the visit, additional usage of staff PPE, and extensive cleaning of exam room while observing appropriate contact time as indicated for disinfecting solutions.   COVID 19 screen:  No recent travel or known exposure to COVID19 The patient denies respiratory symptoms of COVID 19 at  this time. The importance of social distancing was discussed today.   Assessment and Plan    Problem List Items Addressed This Visit     Abdominal wall hernia - Primary    Acute.. likely worsened due to moving/lifting  in past year. No pain.  Low likelyhood of incarceration  but given pt multiple hernia history and mesh present will get surgical recommendations on need for repair or follow.  Encouraged pt to lose weight centrally.  Lifting restrictions given.      Relevant Orders   Ambulatory referral to General Surgery   Itching     No clear environmental exposures. Hx of eczema like rash. No med change.  Nml BMET, TSH and  Cbc. Will eval liver function and iron levels.   Treat  Dry skin as possible cause with hydration,  Hypoallergenic moisturizing cream and if able to afford.. compounded triamcinolone/eucerin cream.      Relevant Orders   Hepatic Function Panel   IBC + Ferritin   Other Visit Diagnoses     Need for influenza vaccination       Relevant Orders   Flu Vaccine QUAD High Dose(Fluad) (Completed)      Written rx for triamcinolone 0.1% 1:1 with eucerin cream. Given.  Eliezer Lofts, MD

## 2020-10-21 NOTE — Assessment & Plan Note (Addendum)
Acute.. likely worsened due to moving/lifting in past year. No pain.  Low likelyhood of incarceration  but given pt multiple hernia history and mesh present will get surgical recommendations on need for repair or follow.  Encouraged pt to lose weight centrally.  Lifting restrictions given.

## 2020-10-21 NOTE — Assessment & Plan Note (Signed)
No clear environmental exposures. Hx of eczema like rash. No med change.  Nml BMET, TSH and  Cbc. Will eval liver function and iron levels.   Treat  Dry skin as possible cause with hydration,  Hypoallergenic moisturizing cream and if able to afford.. compounded triamcinolone/eucerin cream.

## 2020-10-21 NOTE — Patient Instructions (Addendum)
We will start work on setting up referral for hernia consult.  Please stop at the lab to have labs drawn. Get cream for body application daily x [facility] [place] if affordable.  Otherwise apply daily Eucerin OTC or Cetaphil cream Not lotion. Increase water intake.

## 2020-10-22 ENCOUNTER — Other Ambulatory Visit (INDEPENDENT_AMBULATORY_CARE_PROVIDER_SITE_OTHER): Payer: Medicare Other

## 2020-10-22 DIAGNOSIS — K439 Ventral hernia without obstruction or gangrene: Secondary | ICD-10-CM

## 2020-10-22 DIAGNOSIS — R748 Abnormal levels of other serum enzymes: Secondary | ICD-10-CM | POA: Diagnosis not present

## 2020-10-22 DIAGNOSIS — R7401 Elevation of levels of liver transaminase levels: Secondary | ICD-10-CM | POA: Diagnosis not present

## 2020-10-22 LAB — HEPATIC FUNCTION PANEL
ALT: 65 U/L — ABNORMAL HIGH (ref 0–53)
AST: 53 U/L — ABNORMAL HIGH (ref 0–37)
Albumin: 3.6 g/dL (ref 3.5–5.2)
Alkaline Phosphatase: 188 U/L — ABNORMAL HIGH (ref 39–117)
Bilirubin, Direct: 1.6 mg/dL — ABNORMAL HIGH (ref 0.0–0.3)
Total Bilirubin: 3.4 mg/dL — ABNORMAL HIGH (ref 0.2–1.2)
Total Protein: 7.2 g/dL (ref 6.0–8.3)

## 2020-10-23 ENCOUNTER — Other Ambulatory Visit (INDEPENDENT_AMBULATORY_CARE_PROVIDER_SITE_OTHER): Payer: Medicare Other

## 2020-10-23 ENCOUNTER — Other Ambulatory Visit: Payer: Self-pay | Admitting: Family Medicine

## 2020-10-23 ENCOUNTER — Ambulatory Visit
Admission: RE | Admit: 2020-10-23 | Discharge: 2020-10-23 | Disposition: A | Discharge status: Home / Self Care | Payer: Medicare Other | Source: Ambulatory Visit | Attending: Family Medicine | Admitting: Family Medicine

## 2020-10-23 DIAGNOSIS — R945 Abnormal results of liver function studies: Secondary | ICD-10-CM | POA: Diagnosis not present

## 2020-10-23 DIAGNOSIS — R748 Abnormal levels of other serum enzymes: Secondary | ICD-10-CM

## 2020-10-23 DIAGNOSIS — R7989 Other specified abnormal findings of blood chemistry: Secondary | ICD-10-CM

## 2020-10-23 DIAGNOSIS — K838 Other specified diseases of biliary tract: Secondary | ICD-10-CM

## 2020-10-23 LAB — LIPASE: Lipase: 52 U/L (ref 11.0–59.0)

## 2020-10-23 NOTE — Addendum Note (Signed)
Addended by: Octavio Manns E on: 10/23/2020 01:04 PM   Modules accepted: Orders

## 2020-10-26 LAB — CMV (CYTOMEGALOVIRUS) DNA ULTRAQUANT, PCR
CMV DNA, QN PCR: NOT DETECTED log IU/mL
CMV DNA, QN Real Time PCR: NOT DETECTED IU/mL

## 2020-10-26 LAB — HEPATITIS PANEL, ACUTE
Hep A IgM: NONREACTIVE
Hep B C IgM: NONREACTIVE
Hepatitis B Surface Ag: NONREACTIVE
Hepatitis C Ab: NONREACTIVE
SIGNAL TO CUT-OFF: 0.02 (ref <1.00)

## 2020-10-28 ENCOUNTER — Encounter: Payer: Self-pay | Admitting: *Deleted

## 2020-10-31 NOTE — Telephone Encounter (Signed)
I dont think there is anywhere else that he is going to get in any sooner. They are all booking out Oct/Nov and given the referral is not of urgent nature they are not going to expedite it unfortunately. At least they have him on the cancellation list so that if something does come up sooner then they will call to schedule.   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 he is on vacation 2 weeks then on call the week after... nowhere to fit anyone in. He does not do double booking. And he is on vacation now.   Unless there is a cancellation or he approves the patient to be seen sooner or by another Provider then November is the best appt.

## 2020-11-05 ENCOUNTER — Other Ambulatory Visit: Payer: Self-pay | Admitting: Family Medicine

## 2020-11-05 MED ORDER — HYDROXYZINE HCL 10 MG PO TABS: 10.0000 mg | ORAL_TABLET | Freq: Three times a day (TID) | ORAL | 0 refills | Status: DC | PRN

## 2020-11-10 NOTE — Progress Notes (Signed)
Cardiology Office Note    Date:  11/11/2020   ID:  Jason Solomon, DOB March 03, 1940, MRN [id number]  PCP:  Jinny Sanders, MD  Cardiologist:  Nelva Bush, MD  Electrophysiologist:  None   Chief Complaint: Follow-up  History of Present Illness:   Jason Solomon is a 80 y.o. 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  Stress echo in 2017 showed no evidence of ischemia with a preserved LVSF.  He was noted to have frequent PVCs and PACs noted at rest and during stress.  Lexiscan MPI in 2019 showed no significant ischemia with decreased uptake in the basal inferoseptal, basal inferolateral, and apical walls consistent with soft tissue attenuation versus possible scar.  EF of 41%.  He underwent echo in 10/2017, to confirm EF, which demonstrated an LV systolic function of 55 to 60%, no regional wall motion abnormalities, grade 1 diastolic dysfunction, mild concentric LVH, mild mitral regurgitation, mild biatrial enlargement, and a normal PASP.  Coronary CTA in 09/2018 showed a calcium score of 1093.  There was moderate CAD in the proximal and mid LAD, mid LCx, and distal RCA.  Study was submitted for FFR which demonstrated normal readings diffusely with continued medical management recommended.  PFTs in 11/2018 demonstrated mild obstructive airway disease.  Echo in 11/2018 showed an EF of 55 to 65%, no LVH, diastolic dysfunction, normal RV systolic function and ventricular cavity size, mildly dilated left atrium, trace mitral regurgitation, normal PASP, and an estimated right atrial pressure of 3 mmHg.  He was last seen in the office in 07/2020 with noted continued stable exertional dyspnea.  He was able to do his usual activities including exercising and playing golf.  He did note palpitations.  Subsequent  Zio patch in 07/2020 showed a predominant rhythm of sinus with an average rate of 65 bpm (range 39-1 38 in sinus), frequent PACs representing a 12% burden, occasional PVCs representing a 3% burden, 92 episodes of NSVT with the longest episode lasting 11 beats, 1541 atrial runs lasting up to 1 minute and 32 seconds with a maximal rate of 207 bpm, blocked PACs, and idioventricular rhythm were noted.  Patient triggered events corresponded to sinus rhythm, PACs, PVCs, and SVT.  Ordering provider placed referral to EP at that time.  Subsequent echo in 09/2020 showed an EF of 55 to 60%, no regional wall motion abnormalities, grade 1 diastolic dysfunction, normal RV systolic function with mildly enlarged ventricular cavity size, mildly dilated left atrium, and no significant valvular abnormalities.  More recently, he has been undergoing evaluation for abdominal pain with ultrasound showing marked intra and extrahepatic biliary duct dilatation as well as echogenic liver.  He has been referred to GI, and is awaiting appointment with them.  He comes in today continuing to note exertional shortness of breath that is largely unchanged and dates back the past couple of years.  No angina.  No lower extremity swelling.  He has stable two-pillow orthopnea.  His weight is down 25 pounds today when compared to his last clinic visit in 07/2020.  He indicates this is in the setting of dietary changes with frequent small meals in the context of underlying abdominal discomfort.  He is scheduled to see EP at the request of Marrianne Mood, PA-C tomorrow for the above Zio patch.  He is  also scheduled to see GI next week.   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   COPD (chronic obstructive pulmonary  disease) ([facility])    DIVERTICULOSIS, COLON 01/12/2007   EUSTACHIAN TUBE DYSFUNCTION, BILATERAL 12/28/2006   GERD (gastroesophageal reflux disease)    GOITER, MULTINODULAR 05/04/2010   GOUT 01/12/2007   Heart murmur    childhood only   HYPERCHOLESTEROLEMIA 06/20/2007   Hyperlipidemia    HYPERTENSION 01/12/2007   HYPERTHYROIDISM 04/24/2010   OBESITY 01/12/2007   PROSTATE SPECIFIC ANTIGEN, ELEVATED 10/08/2009   Renal calculi    Shortness of breath dyspnea    with exertion    Past Surgical History:  Procedure Laterality Date   CARDIOVASCULAR STRESS TEST  2006   negative   HAND SURGERY  1949   left hand laceration   HERNIA REPAIR  1990's   right and left inguinal hernia   KNEE SURGERY  1990's   TONSILLECTOMY  1945    Current Medications: Current Meds  Medication Sig   allopurinol (ZYLOPRIM) 300 MG tablet TAKE 1 TABLET BY MOUTH  DAILY   amLODipine (NORVASC) 5 MG tablet Take 1 tablet (5 mg total) by mouth daily.   aspirin EC 81 MG tablet Take 1 tablet (81 mg total) by mouth daily. Swallow whole.   fexofenadine (ALLEGRA) 180 MG tablet Take 180 mg by mouth daily.   Halobetasol Propionate (ULTRAVATE) 0.05 % LOTN APPLY ON THE SKIN DAILY. AVOID FACE, GROIN, AND UNDERARMS   hydrochlorothiazide (HYDRODIURIL) 25 MG tablet TAKE 1 TABLET BY MOUTH  DAILY   hydrOXYzine (ATARAX/VISTARIL) 10 MG tablet Take 1 tablet (10 mg total) by mouth 3 (three) times daily as needed for itching.   isosorbide mononitrate (IMDUR) 30 MG 24 hr tablet Take 0.5 tablets (15 mg total) by mouth daily.   levothyroxine (SYNTHROID) 137 MCG tablet TAKE 1 TABLET BY MOUTH  DAILY BEFORE BREAKFAST   omeprazole (PRILOSEC OTC) 20 MG tablet Take 20 mg by mouth daily.   polyethylene glycol (MIRALAX / GLYCOLAX) packet Take 17 g by mouth daily as needed.   terazosin (HYTRIN) 5 MG capsule TAKE 1 CAPSULE BY MOUTH  DAILY   umeclidinium-vilanterol (ANORO ELLIPTA) 62.5-25 MCG/INH AEPB INHALE 1 INHALATION BY  MOUTH INTO THE LUNGS IN THE  MORNING    Allergies:   Ace inhibitors, Angiotensin receptor blockers, and Quinolones   Social History   Socioeconomic History   Marital status: Married    Spouse name: Not on file   Number of children: 4   Years of education: Not on file   Highest education level: Not on file  Occupational History    Employer: RETIRED    Comment: Retired  Tobacco Use   Smoking status: Former    Packs/day: 2.00    Years: 30.00    Pack years: 60.00    Types: Cigarettes    Quit date: 1990    Years since quitting: 32.7   Smokeless tobacco: Never  Vaping Use   Vaping Use: Never used  Substance and Sexual Activity   Alcohol use: Not Currently    Alcohol/week: 5.0 standard drinks    Types: 5 Glasses of wine per week   Drug use: No   Sexual activity: Never  Other Topics Concern  Not on file  Social History Narrative   Regular exercise-yes, walks , golf   Occupation: Retired Hydrographic surveyor   Diet: (+) fruit, (+) veggies, (+) salad, (+) H20   Children: 4, healthy   Married x 43 years   Living will, [place], daughter. Full code. (reviewed 2013)   Social Determinants of Health   Financial Resource Strain: Not on file  Food Insecurity: Not on file  Transportation Needs: Not on file  Physical Activity: Not on file  Stress: Not on file  Social Connections: Not on file     Family History:  The patient's family history includes COPD in his brother; Cancer in his maternal grandmother; Hypertension in his father and mother; Lung cancer (age of onset: 61) in his brother; Stroke in his father; Throat cancer in his brother. There is no history of Thyroid disease.  ROS:   Review of Systems  Constitutional:  Positive for malaise/fatigue and weight loss. Negative for chills, diaphoresis and fever.  HENT:  Negative for congestion.   Eyes:  Negative for discharge and redness.  Respiratory:  Positive for shortness of breath. Negative for cough, sputum production and  wheezing.   Cardiovascular:  Negative for chest pain, palpitations, orthopnea, claudication, leg swelling and PND.  Gastrointestinal:  Positive for abdominal pain. Negative for heartburn, nausea and vomiting.  Musculoskeletal:  Negative for falls and myalgias.  Skin:  Negative for rash.  Neurological:  Positive for weakness. Negative for dizziness, tingling, tremors, sensory change, speech change, focal weakness and loss of consciousness.  Endo/Heme/Allergies:  Does not bruise/bleed easily.  Psychiatric/Behavioral:  Negative for substance abuse. The patient is not nervous/anxious.   All other systems reviewed and are negative.   EKGs/Labs/Other Studies Reviewed:    Studies reviewed were summarized above. The additional studies were reviewed today:  2D echo 10/02/2020: 1. Left ventricular ejection fraction, by estimation, is 55 to 60%. The  left ventricle has normal function. The left ventricle has no regional  wall motion abnormalities. Left ventricular diastolic parameters are  consistent with Grade I diastolic  dysfunction (impaired relaxation).   2. Right ventricular systolic function is normal. The right ventricular  size is mildly enlarged.   3. Left atrial size was mildly dilated.   4. The mitral valve is normal in structure. No evidence of mitral valve  regurgitation.   5. The aortic valve is grossly normal. Aortic valve regurgitation is not  visualized.  __________  Elwyn Reach patch 07/2020: The patient was monitored for 14 days. The predominant rhythm was sinus with an average rate of 65 bpm in sinus (range 39-138 bpm in sinus). There were frequent PAC's (12%) and occasional PVC's (3%). 92 episodes of nonsustained ventricular tachycardia occurred, lasting up to 11 beats with a maximum rate of 235 bpm. 1541 atrial runs occurred, lasting up to 1:32 minutes with a maximum rate of 207 bpm. Blocked PAC's as well as episodes of idioventricular rhythm were observed. Patient triggered  events correspond to sinus rhythm, PAC's, PVC's, and SVT.   Predominantly sinus rhythm with frequent PAC's, occasional PVC's, and multiple episodes of NSVT and PSVT, as detailed above. __________  2D echo 12/07/2018: 1. Left ventricular ejection fraction, by visual estimation, is 55 to  60%. The left ventricle has normal function. Normal left ventricular size.  There is no left ventricular hypertrophy.   2. Left ventricular diastolic Doppler parameters are consistent with  impaired relaxation pattern of LV diastolic filling.   3. Global right ventricle  has normal systolic function.The right  ventricular size is normal. Right vetricular wall thickness was not  assessed.   4. Left atrial size was mildly dilated.   5. Right atrial size was normal.   6. The mitral valve is normal in structure. Trace mitral valve  regurgitation.   7. The tricuspid valve is normal in structure. Tricuspid valve  regurgitation was not visualized by color flow Doppler.   8. The aortic valve is tricuspid Aortic valve regurgitation was not  visualized by color flow Doppler.   9. The pulmonic valve was not well visualized. Pulmonic valve  regurgitation is trivial by color flow Doppler.  10. Normal pulmonary artery systolic pressure.  11. The inferior vena cava is normal in size with greater than 50%  respiratory variability, suggesting right atrial pressure of 3 mmHg. __________  Coronary CTA 09/2018: FFR: 1. Left Main: 1.0.   2. LAD: Proximal: 0.94, distal: 0.90. 3. D1: 0.89. 4. LCX: Proximal: 0.97, distal: 0.90. 5. RCA: Proximal: 0.99, distal: 0.96.   IMPRESSION: 1. CT FFR analysis didn't show any significant stenosis. Aggressive medical management is recommended.   Aorta: Upper normal size of the ascending aorta with maximum diameter 41 mm. Mild diffuse atherosclerotic plaque and calcifications and no dissection.   Aortic Valve:  Trileaflet.  No calcifications.   Coronary Arteries:  Normal  coronary origin.  Right dominance.   RCA is a large dominant artery that gives rise to PDA and PLA. Proximal and mid RCA have diffuse calcified plaque with stenosis 25-49%. Distal RCA has moderate calcified plaque with stenosis 50-69%. PLA and PDA are not well visualized.   Left main is a long and large artery that gives rise to LAD and LCX arteries. Left main has no plaque.   LAD is a large vessel that gives rise to one diagonal artery. Proximal LAD had a moderate calcified plaque with stenosis 50-69%. Mid LAD has another moderate calcified plaque with stenosis 50-69%. Distal LAD and D1 have only mild plaque.   LCX is a non-dominant artery that gives rise to one small OM1 branch. There is moderate calcified plaque in the mid LCX artery with stenosis 50-69%. OM1 is a small artery with mild plaque.   Other findings:   Normal pulmonary vein drainage into the left atrium.   Normal let atrial appendage without a thrombus.   IMPRESSION: 1. Coronary calcium score of 1093. This was 76 percentile for age and sex matched control. 2. Normal coronary origin with right dominance. 3. Moderate CAD in the proximal and mid LAD, mid LCX and distal RCA. Aggressive medical management is recommended. Additional analysis with CT FFR are recommended. 4. Upper normal size of the ascending aorta (for age/sex) with maximum diameter 41 mm. 5. Dilated pulmonary artery measuring 47 mm consistent with pulmonary hypertension. __________  2D echo 11/18/2017: - Left ventricle: The cavity size was normal. There was mild    concentric hypertrophy. Systolic function was normal. The    estimated ejection fraction was in the range of 55% to 60%. Wall    motion was normal; there were no regional wall motion    abnormalities. Doppler parameters are consistent with abnormal    left ventricular relaxation (grade 1 diastolic dysfunction).  - Mitral valve: There was mild regurgitation.  - Left atrium: The atrium  was mildly dilated.  - Right atrium: The atrium was mildly dilated.  - Pulmonary arteries: Systolic pressure was within the normal    range.  __________  Nuclear stress test  10/31/2017: Nuclear stress EF: 41%. There was no ST segment deviation noted during stress. Decreased tracer activity in the inferior, basal inferoseptal, basal inferolateral and apical walls consistent with soft tissue attenuation and possible scar No signficant ischemia Recommend echo to fully evaluate wall motion, LVEF This is an intermediate risk study. __________  Stress echo 03/18/2017: - Stress ECG conclusions: Frequent PVCs and APCs noted at rest and    during stress. The stress ECG was normal with no significant ST    changes concerning for ischemia.  - Staged echo: Normal echo stress   Impressions:   - Normal study after maximal exercise. Significant ectopy    throughout the study including PVCs and APCs. Consider a holter    monitor if clinically indicated.     EKG:  EKG is ordered today.  The EKG ordered today demonstrates NSR, 69 bpm, left axis deviation, first-degree AV block, rare PVC no acute ST-T changes  Recent Labs: 08/11/2020: BUN 13; Creatinine, Ser 1.06; Hemoglobin 14.4; Platelets 179; Potassium 4.0; Sodium 142; TSH 1.670 10/22/2020: ALT 65  Recent Lipid Panel    Component Value Date/Time   CHOL 121 01/24/2020 0956   TRIG 81.0 01/24/2020 0956   HDL 50.30 01/24/2020 0956   CHOLHDL 2 01/24/2020 0956   VLDL 16.2 01/24/2020 0956   LDLCALC 55 01/24/2020 0956    PHYSICAL EXAM:    VS:  BP 134/80 (BP Location: Left Arm, Patient Position: Sitting, Cuff Size: Large)   Pulse 69   Ht 6\' 1"  (1.854 m)   Wt 242 lb (109.8 kg)   SpO2 97%   BMI 31.93 kg/m   BMI: Body mass index is 31.93 kg/m.  Physical Exam Vitals reviewed.  Constitutional:      Appearance: He is well-developed.  HENT:     Head: Normocephalic and atraumatic.  Eyes:     General:        Right eye: No discharge.         Left eye: No discharge.  Neck:     Vascular: No JVD.  Cardiovascular:     Rate and Rhythm: Normal rate and regular rhythm.     Pulses:          Posterior tibial pulses are 2+ on the right side and 2+ on the left side.     Heart sounds: Normal heart sounds, S1 normal and S2 normal. Heart sounds not distant. No midsystolic click and no opening snap. No murmur heard.   No friction rub.  Pulmonary:     Effort: Pulmonary effort is normal. No respiratory distress.     Breath sounds: Normal breath sounds. No decreased breath sounds, wheezing or rales.  Chest:     Chest wall: No tenderness.  Abdominal:     General: There is no distension.     Palpations: Abdomen is soft.     Tenderness: There is no abdominal tenderness.  Musculoskeletal:     Cervical back: Normal range of motion.     Right lower leg: No edema.     Left lower leg: No edema.     Comments: Mild varicosities noted in the bilateral lower extremities  Skin:    General: Skin is warm and dry.     Nails: There is no clubbing.  Neurological:     Mental Status: He is alert and oriented to person, place, and time.  Psychiatric:        Speech: Speech normal.        Behavior: Behavior normal.  Thought Content: Thought content normal.        Judgment: Judgment normal.    Wt Readings from Last 3 Encounters:  11/11/20 242 lb (109.8 kg)  10/21/20 252 lb (114.3 kg)  10/19/20 257 lb (116.6 kg)     ASSESSMENT & PLAN:   Exertional dyspnea: Likely multifactorial including underlying pulmonary disease, nonobstructive CAD, obesity, and physical deconditioning.  Cannot exclude some component of ectopy as well.  Trial of isosorbide mononitrate 15 mg daily.  He should follow-up with pulmonology as directed as well.  He is seeing EP as outlined below.  Recent echo demonstrated preserved LV systolic function with normal wall motion and only grade 1 diastolic dysfunction.  Nonobstructive CAD: Dyspnea is possibly an anginal equivalent.   Given this, we added Imdur as outlined above.  He will otherwise continue aspirin and amlodipine.  He is off atorvastatin for now given abnormal liver function testing  SVT/NSVT: Possibly contributing to his dyspnea.  He is scheduled to see EP tomorrow for further recommendations.  He has been unable to be maintained on a beta-blocker or nondihydropyridine calcium channel blocker given prior sinus bradycardia not on medical therapy with heart rates in the 40s, and in the context of an underlying first-degree AV block.  Bradycardia with first-degree AV block and PACs/PVCs: Stable without symptoms of presyncope or syncope.  He continues to have extrasystoles noted on exam today with a rare PVC on twelve-lead EKG.  He remains off beta-blocker and on dihydropyridine calcium channel blocker.  HTN: Blood pressure is reasonably controlled in the office today.  HLD: LDL 55 in 01/2020.  Atorvastatin has been held in the context of abnormal liver function testing, for which he is awaiting GI evaluation.  Recommend resumption of statin when/if able.  If he is unable to continue statin therapy, we may need to consider referral to the lipid clinic for consideration of PCSK9i.  Weight loss/abnormal LFT: His weight is down 25 pounds today when compared to his visit in 07/2020.  He indicates this is in the context of dietary changes and eating small, frequent meals given his underlying abdominal discomfort.  He is scheduled to see GI next week.  Disposition: F/u with Dr. Saunders Revel or an APP in 3 months.   Medication Adjustments/Labs and Tests Ordered: Current medicines are reviewed at length with the patient today.  Concerns regarding medicines are outlined above. Medication changes, Labs and Tests ordered today are summarized above and listed in the Patient Instructions accessible in Encounters.   Signed, Christell Faith, PA-C 11/11/2020 10:14 AM     [address] [place],  [place] [address] [phone]

## 2020-11-11 ENCOUNTER — Encounter: Payer: Self-pay | Admitting: Physician Assistant

## 2020-11-11 ENCOUNTER — Ambulatory Visit (INDEPENDENT_AMBULATORY_CARE_PROVIDER_SITE_OTHER): Payer: Medicare Other | Admitting: Physician Assistant

## 2020-11-11 ENCOUNTER — Other Ambulatory Visit: Payer: Self-pay

## 2020-11-11 VITALS — BP 134/80 | HR 69 | Ht 73.0 in | Wt 242.0 lb

## 2020-11-11 DIAGNOSIS — R7989 Other specified abnormal findings of blood chemistry: Secondary | ICD-10-CM

## 2020-11-11 DIAGNOSIS — I491 Atrial premature depolarization: Secondary | ICD-10-CM | POA: Diagnosis not present

## 2020-11-11 DIAGNOSIS — R0600 Dyspnea, unspecified: Secondary | ICD-10-CM | POA: Diagnosis not present

## 2020-11-11 DIAGNOSIS — I493 Ventricular premature depolarization: Secondary | ICD-10-CM

## 2020-11-11 DIAGNOSIS — I471 Supraventricular tachycardia, unspecified: Secondary | ICD-10-CM

## 2020-11-11 DIAGNOSIS — I4729 Other ventricular tachycardia: Secondary | ICD-10-CM

## 2020-11-11 DIAGNOSIS — I472 Ventricular tachycardia: Secondary | ICD-10-CM | POA: Diagnosis not present

## 2020-11-11 DIAGNOSIS — R634 Abnormal weight loss: Secondary | ICD-10-CM | POA: Diagnosis not present

## 2020-11-11 DIAGNOSIS — R001 Bradycardia, unspecified: Secondary | ICD-10-CM | POA: Diagnosis not present

## 2020-11-11 DIAGNOSIS — I10 Essential (primary) hypertension: Secondary | ICD-10-CM

## 2020-11-11 DIAGNOSIS — I2510 Atherosclerotic heart disease of native coronary artery without angina pectoris: Secondary | ICD-10-CM

## 2020-11-11 DIAGNOSIS — R945 Abnormal results of liver function studies: Secondary | ICD-10-CM

## 2020-11-11 DIAGNOSIS — R0609 Other forms of dyspnea: Secondary | ICD-10-CM

## 2020-11-11 DIAGNOSIS — I440 Atrioventricular block, first degree: Secondary | ICD-10-CM | POA: Diagnosis not present

## 2020-11-11 MED ORDER — ISOSORBIDE MONONITRATE ER 30 MG PO TB24: 15.0000 mg | ORAL_TABLET | Freq: Every day | ORAL | 3 refills | Status: AC

## 2020-11-11 NOTE — Patient Instructions (Addendum)
Medication Instructions:  Your physician has recommended you make the following change in your medication:   START Imdur (Isosorbide mononitrate) 30 mg and take one half tablet (1/2) which will be 15 mg once daily.   *If you need a refill on your cardiac medications before your next appointment, please call your pharmacy*   Lab Work: None  If you have labs (blood work) drawn today and your tests are completely normal, you will receive your results only by: [facility] (if you have MyChart) OR A paper copy in the mail If you have any lab test that is abnormal or we need to change your treatment, we will call you to review the results.   Testing/Procedures: None   Follow-Up: At [facility], you and your health needs are our priority.  As part of our continuing mission to provide you with exceptional heart care, we have created designated Provider Care Teams.  These Care Teams include your primary Cardiologist (physician) and Advanced Practice Providers (APPs -  Physician Assistants and Nurse Practitioners) who all work together to provide you with the care you need, when you need it.   Your next appointment:   3 month(s)  The format for your next appointment:   In Person  Provider:   Nelva Bush, MD or Christell Faith, PA-C   Other Instructions KEEP appointment with Dr. Quentin Ore tomorrow 11/12/20 at 09:20 am

## 2020-11-11 NOTE — Progress Notes (Signed)
Electrophysiology Office Note:    Date:  11/12/2020   ID:  Jason Solomon, DOB 05-02-1940, MRN [id number]  PCP:  Jinny Sanders, MD  [facility] Cardiologist:  Nelva Bush, MD  [facility] Electrophysiologist:  Vickie Epley, MD   Referring MD: Arvil Chaco, PA*   Chief Complaint: PVCs  History of Present Illness:    Jason Solomon is a 80 y.o. male who presents for an evaluation of PVC/PAC at the request of Christell Faith, PA-C. Their medical history includes COPD, GERD, HLD, HTN, obesity.  He saw Christell Faith 11/11/2020. At that appointment he reported exertional dyspnea that has been ongoing for several years. He has lost 25 lbs due to eating less with chronic abdominal pain for which he has been referred to GI.    He has an appointment with GI scheduled for Tuesday of next week to evaluate the above.  He tells me that he barely appreciates the ectopy that was noted on a recent monitor.  No syncope or presyncope.    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   COPD (chronic obstructive pulmonary disease) ([place])    DIVERTICULOSIS, COLON 01/12/2007   EUSTACHIAN TUBE DYSFUNCTION, BILATERAL 12/28/2006   GERD (gastroesophageal reflux disease)    GOITER, MULTINODULAR 05/04/2010   GOUT 01/12/2007   Heart murmur    childhood only   HYPERCHOLESTEROLEMIA 06/20/2007   Hyperlipidemia    HYPERTENSION 01/12/2007   HYPERTHYROIDISM 04/24/2010   OBESITY 01/12/2007   PROSTATE SPECIFIC ANTIGEN, ELEVATED 10/08/2009   Renal calculi    Shortness of breath dyspnea    with exertion    Past Surgical History:  Procedure Laterality Date   CARDIOVASCULAR STRESS TEST  2006   negative   HAND SURGERY  1949   left hand laceration   HERNIA REPAIR  1990's   right and left inguinal hernia   KNEE SURGERY  1990's   TONSILLECTOMY  1945    Current Medications: Current Meds  Medication Sig   allopurinol (ZYLOPRIM) 300 MG tablet TAKE 1 TABLET BY  MOUTH  DAILY   aspirin EC 81 MG tablet Take 1 tablet (81 mg total) by mouth daily. Swallow whole.   fexofenadine (ALLEGRA) 180 MG tablet Take 180 mg by mouth daily.   Halobetasol Propionate (ULTRAVATE) 0.05 % LOTN APPLY ON THE SKIN DAILY. AVOID FACE, GROIN, AND UNDERARMS   hydrochlorothiazide (HYDRODIURIL) 25 MG tablet TAKE 1 TABLET BY MOUTH  DAILY   hydrOXYzine (ATARAX/VISTARIL) 10 MG tablet Take 1 tablet (10 mg total) by mouth 3 (three) times daily as needed for itching.   isosorbide mononitrate (IMDUR) 30 MG 24 hr tablet Take 0.5 tablets (15 mg total) by mouth daily.   levothyroxine (SYNTHROID) 137 MCG tablet TAKE 1 TABLET BY MOUTH  DAILY BEFORE BREAKFAST   metoprolol succinate (TOPROL XL) 25 MG 24 hr tablet Take 0.5 tablets (12.5 mg total) by mouth daily.   omeprazole (PRILOSEC OTC) 20 MG tablet Take 20 mg by mouth daily.   polyethylene glycol (MIRALAX / GLYCOLAX) packet Take 17 g by mouth daily as needed.   terazosin (HYTRIN) 5 MG capsule TAKE 1 CAPSULE BY MOUTH  DAILY   umeclidinium-vilanterol (ANORO ELLIPTA) 62.5-25 MCG/INH AEPB INHALE 1 INHALATION BY  MOUTH INTO THE LUNGS IN THE MORNING     Allergies:   Ace inhibitors, Angiotensin receptor blockers, and Quinolones   Social History   Socioeconomic History   Marital status: Married  Spouse name: Not on file   Number of children: 4   Years of education: Not on file   Highest education level: Not on file  Occupational History    Employer: RETIRED    Comment: Retired  Tobacco Use   Smoking status: Former    Packs/day: 2.00    Years: 30.00    Pack years: 60.00    Types: Cigarettes    Quit date: 1990    Years since quitting: 32.7   Smokeless tobacco: Never  Vaping Use   Vaping Use: Never used  Substance and Sexual Activity   Alcohol use: Not Currently    Alcohol/week: 5.0 standard drinks    Types: 5 Glasses of wine per week   Drug use: No   Sexual activity: Never  Other Topics Concern   Not on file  Social History  Narrative   Regular exercise-yes, walks , golf   Occupation: Retired Hydrographic surveyor   Diet: (+) fruit, (+) veggies, (+) salad, (+) H20   Children: 4, healthy   Married x 43 years   Living will, [place], daughter. Full code. (reviewed 2013)   Social Determinants of Health   Financial Resource Strain: Not on file  Food Insecurity: Not on file  Transportation Needs: Not on file  Physical Activity: Not on file  Stress: Not on file  Social Connections: Not on file     Family History: The patient's family history includes COPD in his brother; Cancer in his maternal grandmother; Hypertension in his father and mother; Lung cancer (age of onset: 28) in his brother; Stroke in his father; Throat cancer in his brother. There is no history of Thyroid disease.  ROS:   Please see the history of present illness.    All other systems reviewed and are negative.  EKGs/Labs/Other Studies Reviewed:    The following studies were reviewed today:  10/02/2020 Echo personally reviewed EF 55% RV normal No significant valve disease  09/05/2020 Zio personally reviewed Frequent PACs, 12% Occasional PVC, 3% 92 episodes of NSVT lasting up to 11  beats    EKG:  The ekg ordered yesterday shows sinus rhythm and a single PVC.  Intervals are normal.  Recent Labs: 08/11/2020: BUN 13; Creatinine, Ser 1.06; Hemoglobin 14.4; Platelets 179; Potassium 4.0; Sodium 142; TSH 1.670 10/22/2020: ALT 65  Recent Lipid Panel    Component Value Date/Time   CHOL 121 01/24/2020 0956   TRIG 81.0 01/24/2020 0956   HDL 50.30 01/24/2020 0956   CHOLHDL 2 01/24/2020 0956   VLDL 16.2 01/24/2020 0956   LDLCALC 55 01/24/2020 0956    Physical Exam:    VS:  BP 118/60 (BP Location: Left Arm, Patient Position: Sitting, Cuff Size: Normal)   Pulse 72   Ht 6\' 1"  (1.854 m)   Wt 243 lb (110.2 kg)   SpO2 94%   BMI 32.06 kg/m     Wt Readings from Last 3 Encounters:  11/12/20 243 lb (110.2 kg)   11/11/20 242 lb (109.8 kg)  10/21/20 252 lb (114.3 kg)     GEN:  Well nourished, well developed in no acute distress.  Obese HEENT: Normal NECK: No JVD; No carotid bruits LYMPHATICS: No lymphadenopathy CARDIAC: RRR, no murmurs, rubs, gallops RESPIRATORY:  Clear to auscultation without rales, wheezing or rhonchi  ABDOMEN: Soft, non-tender, non-distended MUSCULOSKELETAL:  No edema; No deformity  SKIN: Warm and dry NEUROLOGIC:  Alert and oriented x 3 PSYCHIATRIC:  Normal affect   ASSESSMENT:  1. SVT (supraventricular tachycardia) ([place])   2. NSVT (nonsustained ventricular tachycardia) ([place])   3. Primary hypertension    PLAN:    In order of problems listed above:  #SVT and NSVT Minimal symptoms.  Ejection fraction normal.  I would recommend starting Toprol-XL 12.5 mg by mouth once daily.  #Hypertension Controlled.  Continue hydrochlorothiazide, Imdur.  He should follow-up with Christell Faith, PA-C.   Medication Adjustments/Labs and Tests Ordered: Current medicines are reviewed at length with the patient today.  Concerns regarding medicines are outlined above.  No orders of the defined types were placed in this encounter.  Meds ordered this encounter  Medications   metoprolol succinate (TOPROL XL) 25 MG 24 hr tablet    Sig: Take 0.5 tablets (12.5 mg total) by mouth daily.    Dispense:  45 tablet    Refill:  3      Signed, Konstantina Nachreiner T. Quentin Ore, MD, [facility] 11/12/2020 9:51 AM    Electrophysiology [facility]

## 2020-11-12 ENCOUNTER — Ambulatory Visit: Payer: Medicare Other | Admitting: Gastroenterology

## 2020-11-12 ENCOUNTER — Ambulatory Visit (INDEPENDENT_AMBULATORY_CARE_PROVIDER_SITE_OTHER): Payer: Medicare Other | Admitting: Cardiology

## 2020-11-12 ENCOUNTER — Encounter: Payer: Self-pay | Admitting: Cardiology

## 2020-11-12 VITALS — BP 118/60 | HR 72 | Ht 73.0 in | Wt 243.0 lb

## 2020-11-12 DIAGNOSIS — I10 Essential (primary) hypertension: Secondary | ICD-10-CM | POA: Diagnosis not present

## 2020-11-12 DIAGNOSIS — I471 Supraventricular tachycardia: Secondary | ICD-10-CM | POA: Diagnosis not present

## 2020-11-12 DIAGNOSIS — I472 Ventricular tachycardia: Secondary | ICD-10-CM

## 2020-11-12 DIAGNOSIS — I4729 Other ventricular tachycardia: Secondary | ICD-10-CM

## 2020-11-12 MED ORDER — METOPROLOL SUCCINATE ER 25 MG PO TB24: 12.5000 mg | ORAL_TABLET | Freq: Every day | ORAL | 3 refills | Status: AC

## 2020-11-12 NOTE — Patient Instructions (Addendum)
Medication Instructions:  Your physician has recommended you make the following change in your medication:    START taking metoprolol succinate 25 mg-  Take 1/2 tablet (12.5 mg) by mouth once a day  *If you need a refill on your cardiac medications before your next appointment, please call your pharmacy*  Lab Work: None ordered. If you have labs (blood work) drawn today and your tests are completely normal, you will receive your results only by: [place] (if you have MyChart) OR A paper copy in the mail If you have any lab test that is abnormal or we need to change your treatment, we will call you to review the results.  Testing/Procedures: None ordered.  Follow-Up: At [facility], you and your health needs are our priority.  As part of our continuing mission to provide you with exceptional heart care, we have created designated Provider Care Teams.  These Care Teams include your primary Cardiologist (physician) and Advanced Practice Providers (APPs -  Physician Assistants and Nurse Practitioners) who all work together to provide you with the care you need, when you need it.  Your next appointment:   Your physician wants you to follow-up in: as scheduled with Dr. Saunders Revel.  Metoprolol Extended-Release Tablets What is this medication? METOPROLOL (me TOE proe lole) treats high blood pressure and heart failure. It may also be used to prevent chest pain (angina). It works by lowering your blood pressure and heart rate, making it easier for your heart to pump blood to the rest of your body. It belongs to a group of medications called beta blockers. This medicine may be used for other purposes; ask your health care provider or pharmacist if you have questions. COMMON BRAND NAME(S): toprol, Toprol XL What should I tell my care team before I take this medication? They need to know if you have any of these conditions: Diabetes Heart or vessel disease like slow heart rate, worsening heart  failure, heart block, sick sinus syndrome, or Raynaud's disease Kidney disease Liver disease Lung or breathing disease, like asthma or emphysema Pheochromocytoma Thyroid disease An unusual or allergic reaction to metoprolol, other beta blockers, medications, foods, dyes, or preservatives Pregnant or trying to get pregnant Breast-feeding How should I use this medication? Take this medication by mouth. Take it as directed on the prescription label at the same time every day. Take it with food. You may cut the tablet in half if it is scored (has a line in the middle of it). This may help you swallow the tablet if the whole tablet is too big. Be sure to take both halves. Do not take just one-half of the tablet. Keep taking it unless your care team tells you to stop. Talk to your care team about the use of this medication in children. While it may be prescribed for children as young as 6 years for selected conditions, precautions do apply. Overdosage: If you think you have taken too much of this medicine contact a poison control center or emergency room at once. NOTE: This medicine is only for you. Do not share this medicine with others. What if I miss a dose? If you miss a dose, take it as soon as you can. If it is almost time for your next dose, take only that dose. Do not take double or extra doses. What may interact with this medication? This medication may interact with the following: Certain medications for blood pressure, heart disease, irregular heartbeat Certain medications for depression, like monoamine  oxidase (MAO) inhibitors, fluoxetine, or paroxetine Clonidine Dobutamine Epinephrine Isoproterenol Reserpine This list may not describe all possible interactions. Give your health care provider a list of all the medicines, herbs, non-prescription drugs, or dietary supplements you use. Also tell them if you smoke, drink alcohol, or use illegal drugs. Some items may interact with your  medicine. What should I watch for while using this medication? Visit your care team for regular checks on your progress. Check your blood pressure as directed. Ask your care team what your blood pressure should be. Also, find out when you should contact them. Do not treat yourself for coughs, colds, or pain while you are using this medication without asking your care team for advice. Some medications may increase your blood pressure. You may get drowsy or dizzy. Do not drive, use machinery, or do anything that needs mental alertness until you know how this medication affects you. Do not stand up or sit up quickly, especially if you are an older patient. This reduces the risk of dizzy or fainting spells. Alcohol may interfere with the effect of this medication. Avoid alcoholic drinks. This medication may increase blood sugar. Ask your care team if changes in diet or medications are needed if you have diabetes. What side effects may I notice from receiving this medication? Side effects that you should report to your care team as soon as possible: Allergic reactions-skin rash, itching, hives, swelling of the face, lips, tongue, or throat Heart failure-shortness of breath, swelling of the ankles, feet, or hands, sudden weight gain, unusual weakness or fatigue Low blood pressure-dizziness, feeling faint or lightheaded, blurry vision Raynaud's-cool, numb, or painful fingers or toes that may change color from pale, to blue, to red Slow heartbeat-dizziness, feeling faint or lightheaded, confusion, trouble breathing, unusual weakness or fatigue Worsening mood, feelings of depression Side effects that usually do not require medical attention (report to your care team if they continue or are bothersome): Change in sex drive or performance Diarrhea Dizziness Fatigue Headache This list may not describe all possible side effects. Call your doctor for medical advice about side effects. You may report side  effects to FDA at 1-800-FDA-1088. Where should I keep my medication? Keep out of the reach of children and pets. Store at room temperature between 20 and 25 degrees C (68 and 77 degrees F). Throw away any unused medication after the expiration date. NOTE: This sheet is a summary. It may not cover all possible information. If you have questions about this medicine, talk to your doctor, pharmacist, or health care provider.  2022 Elsevier/Gold Standard (2020-03-13 12:55:47)

## 2020-11-19 ENCOUNTER — Ambulatory Visit (INDEPENDENT_AMBULATORY_CARE_PROVIDER_SITE_OTHER): Payer: Medicare Other | Admitting: Gastroenterology

## 2020-11-19 ENCOUNTER — Other Ambulatory Visit: Payer: Self-pay

## 2020-11-19 ENCOUNTER — Encounter: Payer: Self-pay | Admitting: Gastroenterology

## 2020-11-19 VITALS — BP 120/66 | HR 42 | Temp 97.6°F | Ht 73.0 in | Wt 246.4 lb

## 2020-11-19 DIAGNOSIS — R748 Abnormal levels of other serum enzymes: Secondary | ICD-10-CM

## 2020-11-19 DIAGNOSIS — I2510 Atherosclerotic heart disease of native coronary artery without angina pectoris: Secondary | ICD-10-CM

## 2020-11-19 DIAGNOSIS — K838 Other specified diseases of biliary tract: Secondary | ICD-10-CM

## 2020-11-19 NOTE — Progress Notes (Signed)
Cancelled.  

## 2020-11-19 NOTE — Progress Notes (Signed)
Gastroenterology Consultation  Referring Provider:     Jinny Sanders, MD Primary Care Physician:  Jinny Sanders, MD Primary Gastroenterologist:  Dr. Allen Norris     Reason for Consultation:     Abnormal liver enzymes and ultrasound        HPI:   Jason Solomon is a 80 y.o. y/o male referred for consultation & management of abnormal liver enzymes and ultrasound by Dr. Diona Browner, Amy E, MD. This patient comes in today after being seen by his primary care provider and had ultrasound done that showed:  IMPRESSION: 1. Marked intra- and extra-hepatic biliary ductal dilation. Consider cross-sectional imaging with contrasted CT and/or MR/MRCP for further evaluation. 2. Echogenic liver. Findings commonly seen in hepatic steatosis, though may also represent hepatitis and/or fibrosis.  In addition to this the patient had blood work that showed:  Component     Latest Ref Rng & Units 01/24/2020 10/21/2020 10/22/2020  Total Bilirubin     0.2 - 1.2 mg/dL 1.1 3.6 (H) 3.4 (H)  Bilirubin, Direct     0.0 - 0.3 mg/dL  1.7 (H) 1.6 (H)  Alkaline Phosphatase     39 - 117 U/L 69 202 (H) 188 (H)  AST     0 - 37 U/L 30 58 (H) 53 (H)  ALT     0 - 53 U/L 43 72 (H) 65 (H)   The patient was seen in the ED for abdominal pain nausea and vomiting and followed up with his primary care provider both of the end of August.  It was thought that his abdominal wall hernia was contributing to his discomfort.  The patient also had reported itching and eczema was noted as a possible cause for the patient's itching.  Patient states he has been tried on multiple medications including Atarax Benadryl and topical creams without any relief of his itching.  He also denies ever having liver problems in the past.  He used to drink wine on a daily basis and that was for about 5 years.  He has not done that in many years.  The patient has been followed in the past in [place] by [facility] but comes to see me today.  When asked  why he had switched he reported that he was seeing them when he was not have any problems but came to see me because this is where his primary had sent him.  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   COPD (chronic obstructive pulmonary disease) ([place])    DIVERTICULOSIS, COLON 01/12/2007   EUSTACHIAN TUBE DYSFUNCTION, BILATERAL 12/28/2006   GERD (gastroesophageal reflux disease)    GOITER, MULTINODULAR 05/04/2010   GOUT 01/12/2007   Heart murmur    childhood only   HYPERCHOLESTEROLEMIA 06/20/2007   Hyperlipidemia    HYPERTENSION 01/12/2007   HYPERTHYROIDISM 04/24/2010   OBESITY 01/12/2007   PROSTATE SPECIFIC ANTIGEN, ELEVATED 10/08/2009   Renal calculi    Shortness of breath dyspnea    with exertion    Past Surgical History:  Procedure Laterality Date   CARDIOVASCULAR STRESS TEST  2006   negative   HAND SURGERY  1949   left hand laceration   HERNIA REPAIR  1990's   right and left inguinal hernia   KNEE SURGERY  1990's   TONSILLECTOMY  1945    Prior to Admission medications   Medication Sig Start Date End Date Taking? Authorizing Provider  allopurinol (ZYLOPRIM) 300 MG  tablet TAKE 1 TABLET BY MOUTH  DAILY 02/14/20   Bedsole, Amy E, MD  aspirin EC 81 MG tablet Take 1 tablet (81 mg total) by mouth daily. Swallow whole. 04/16/20   End, Harrell Gave, MD  fexofenadine (ALLEGRA) 180 MG tablet Take 180 mg by mouth daily.    [provider]  Halobetasol Propionate (ULTRAVATE) 0.05 % LOTN APPLY ON THE SKIN DAILY. AVOID FACE, GROIN, AND UNDERARMS 09/12/20   Bedsole, Amy E, MD  hydrochlorothiazide (HYDRODIURIL) 25 MG tablet TAKE 1 TABLET BY MOUTH  DAILY 03/19/20   Bedsole, Amy E, MD  hydrOXYzine (ATARAX/VISTARIL) 10 MG tablet Take 1 tablet (10 mg total) by mouth 3 (three) times daily as needed for itching. 11/05/20   Bedsole, Amy E, MD  isosorbide mononitrate (IMDUR) 30 MG 24 hr tablet Take 0.5 tablets (15 mg total) by mouth daily. 11/11/20 02/09/21   Rise Mu, PA-C  levothyroxine (SYNTHROID) 137 MCG tablet TAKE 1 TABLET BY MOUTH  DAILY BEFORE BREAKFAST 03/13/20   Renato Shin, MD  metoprolol succinate (TOPROL XL) 25 MG 24 hr tablet Take 0.5 tablets (12.5 mg total) by mouth daily. 11/12/20   Vickie Epley, MD  omeprazole (PRILOSEC OTC) 20 MG tablet Take 20 mg by mouth daily.    [provider]  polyethylene glycol (MIRALAX / GLYCOLAX) packet Take 17 g by mouth daily as needed.    [provider]  terazosin (HYTRIN) 5 MG capsule TAKE 1 CAPSULE BY MOUTH  DAILY 02/14/20   Bedsole, Amy E, MD  umeclidinium-vilanterol (ANORO ELLIPTA) 62.5-25 MCG/INH AEPB INHALE 1 INHALATION BY  MOUTH INTO THE LUNGS IN THE MORNING 09/19/20   Jinny Sanders, MD    Family History  Problem Relation Age of Onset   Hypertension Mother    COPD Brother    Lung cancer Brother 57   Throat cancer Brother    Hypertension Father    Stroke Father    Cancer Maternal Grandmother        unknown   Thyroid disease Neg Hx      Social History   Tobacco Use   Smoking status: Former    Packs/day: 2.00    Years: 30.00    Pack years: 60.00    Types: Cigarettes    Quit date: 1990    Years since quitting: 32.7   Smokeless tobacco: Never  Vaping Use   Vaping Use: Never used  Substance Use Topics   Alcohol use: Not Currently    Alcohol/week: 5.0 standard drinks    Types: 5 Glasses of wine per week   Drug use: No    Allergies as of 11/19/2020 - Review Complete 11/12/2020  Allergen Reaction Noted   Ace inhibitors  04/06/2010   Angiotensin receptor blockers  04/06/2010   Quinolones  08/13/2020    Review of Systems:    All systems reviewed and negative except where noted in HPI.   Physical Exam:  There were no vitals taken for this visit. No LMP for male patient. General:   Alert,  Well-developed, well-nourished, pleasant and cooperative in NAD Head:  Normocephalic and atraumatic. Eyes:  Sclera clear, positive icterus.   Conjunctiva  yellow. Ears:  Normal auditory acuity. Neck:  Supple; no masses or thyromegaly. Lungs:  Respirations even and unlabored.  Clear throughout to auscultation.   No wheezes, crackles, or rhonchi. No acute distress. Heart:  Regular rate and rhythm; no murmurs, clicks, rubs, or gallops. Abdomen:  Normal bowel sounds.  No bruits.  Soft, non-tender  and non-distended without masses, hepatosplenomegaly or hernias noted.  No guarding or rebound tenderness.  Negative Carnett sign.   Rectal:  Deferred.  Pulses:  Normal pulses noted. Extremities:  No clubbing or edema.  No cyanosis. Neurologic:  Alert and oriented x3;  grossly normal neurologically. Skin:  Intact without significant lesions or rashes.  Lymph Nodes:  No significant cervical adenopathy. Psych:  Alert and cooperative. Normal mood and affect.  Imaging Studies: US Abdomen Limited RUQ (LIVER/GB)  Result Date: 10/23/2020 CLINICAL DATA:  Elevated liver enzymes. EXAM: ULTRASOUND ABDOMEN LIMITED RIGHT UPPER QUADRANT COMPARISON:  CT chest, 09/01/2018. CT abdomen and pelvis, 05/07/2014. FINDINGS: Gallbladder: Nondistended gallbladder, with dependent layering echogenic sludge/stones. No wall thickening. No sonographic Murphy sign noted by sonographer. Common bile duct: Diameter: Measures up to 1.2 cm, dilated. Liver: Intrahepatic biliary ductal dilation (see key image). Increased hepatic parenchymal echogenicity. Smooth contours. No focal lesion identified. Portal vein is patent on color Doppler imaging with normal direction of blood flow towards the liver. Other: No perihepatic ascites. IMPRESSION: 1. Marked intra- and extra-hepatic biliary ductal dilation. Consider cross-sectional imaging with contrasted CT and/or MR/MRCP for further evaluation. 2. Echogenic liver. Findings commonly seen in hepatic steatosis, though may also represent hepatitis and/or fibrosis. These results will be called to the ordering clinician or representative by the Radiologist  Assistant, and communication documented in the PACS or [employer]. Electronically Signed   By: Michaelle Birks M.D.   On: 10/23/2020 07:43    Assessment and Plan:   Jason Solomon is a 80 y.o. y/o male who comes in today with a history of dark urine and itching.  He also had abdominal pain that has improved.  The patient was found to have abnormal liver enzymes with a dilated common bile duct to 12 mm on ultrasound.  The ultrasound had recommended a CT scan versus an MRCP for further evaluation of his dilated common bile duct.  The patient will be set up for an MRCP/MRI to rule out any common bile duct stone or pancreatic mass as the cause of his abnormal liver enzymes and jaundice.  The patient will be notified of the results of the imaging once they are back.  The patient has been explained the plan agrees with it    Lucilla Lame, MD. [facility]    Note: This dictation was prepared with Dragon dictation along with smaller phrase technology. Any transcriptional errors that result from this process are unintentional.

## 2020-11-19 NOTE — Patient Instructions (Signed)
You are scheduled for an MRI/MRCP at [facility] on Friday, Oct 7th at 8:00am. Please arrive at the medical mall registration desk at 7:30am. You cannot have anything to eat or drink 4 hours prior to exam.    If you need to reschedule this appointment for any reason, please contact central scheduling at [phone].

## 2020-11-24 ENCOUNTER — Other Ambulatory Visit: Payer: Self-pay

## 2020-11-24 DIAGNOSIS — R748 Abnormal levels of other serum enzymes: Secondary | ICD-10-CM

## 2020-11-25 ENCOUNTER — Other Ambulatory Visit: Payer: Self-pay | Admitting: Family Medicine

## 2020-11-25 DIAGNOSIS — R748 Abnormal levels of other serum enzymes: Secondary | ICD-10-CM | POA: Diagnosis not present

## 2020-11-26 ENCOUNTER — Telehealth: Payer: Self-pay

## 2020-11-26 LAB — HEPATIC FUNCTION PANEL
ALT: 56 IU/L — ABNORMAL HIGH (ref 0–44)
AST: 73 IU/L — ABNORMAL HIGH (ref 0–40)
Albumin: 3.7 g/dL (ref 3.7–4.7)
Alkaline Phosphatase: 257 IU/L — ABNORMAL HIGH (ref 44–121)
Bilirubin Total: 8.5 mg/dL — ABNORMAL HIGH (ref 0.0–1.2)
Bilirubin, Direct: 6.22 mg/dL — ABNORMAL HIGH (ref 0.00–0.40)
Total Protein: 6.5 g/dL (ref 6.0–8.5)

## 2020-11-26 NOTE — Telephone Encounter (Signed)
His bilirubin has gone up , he may have a biliary obstruction. If he is feeling worse with fever or nausea or vomiting or being unwell  suggest to go to [facility] or [facility] or [facility] since he would need likely an ERCP based on MRCP which we do not have here as Dr Allen Norris is on vacation . Otherwise can advise as soon as we get his MRCP results

## 2020-11-26 NOTE — Telephone Encounter (Signed)
His bilirubin has gone up , he may have a biliary obstruction. If he is feeling worse with fever or nausea or vomiting or being unwell  suggest to go to [facility] or [facility] or [facility] since he would need likely an ERCP based on MRCP which we do not have here as Dr Allen Norris is on vacation . Otherwise can advise as soon as we get his MRCP results    **Pt advised of lab results. Pt's wife stated that he isn't feeling that bad currently. She has been giving him chicken broth and crackers. I have advised her if he turns yellow in color and starts running a fever with nausea and vomiting, he is to go to [facility] ER asap. She said she feels he is well enough to hold out until the MRI that is currently scheduled for Friday, 11/28/20.

## 2020-11-28 ENCOUNTER — Ambulatory Visit
Admission: RE | Admit: 2020-11-28 | Discharge: 2020-11-28 | Disposition: A | Discharge status: Home / Self Care | Payer: Medicare Other | Source: Ambulatory Visit | Attending: Gastroenterology | Admitting: Gastroenterology

## 2020-11-28 DIAGNOSIS — K838 Other specified diseases of biliary tract: Secondary | ICD-10-CM | POA: Insufficient documentation

## 2020-11-28 DIAGNOSIS — K5730 Diverticulosis of large intestine without perforation or abscess without bleeding: Secondary | ICD-10-CM | POA: Diagnosis not present

## 2020-11-28 DIAGNOSIS — R7989 Other specified abnormal findings of blood chemistry: Secondary | ICD-10-CM | POA: Diagnosis not present

## 2020-11-28 DIAGNOSIS — K863 Pseudocyst of pancreas: Secondary | ICD-10-CM | POA: Diagnosis not present

## 2020-11-28 DIAGNOSIS — K8050 Calculus of bile duct without cholangitis or cholecystitis without obstruction: Secondary | ICD-10-CM | POA: Diagnosis not present

## 2020-11-28 MED ORDER — GADOBUTROL 1 MMOL/ML IV SOLN
10.0000 mL | Freq: Once | INTRAVENOUS | Status: AC | PRN
  Administered 2020-11-28: 10 mL via INTRAVENOUS

## 2020-12-01 ENCOUNTER — Encounter: Payer: Self-pay | Admitting: Gastroenterology

## 2020-12-01 ENCOUNTER — Other Ambulatory Visit: Payer: Self-pay

## 2020-12-01 ENCOUNTER — Telehealth (INDEPENDENT_AMBULATORY_CARE_PROVIDER_SITE_OTHER): Payer: Medicare Other | Admitting: Gastroenterology

## 2020-12-01 DIAGNOSIS — I2510 Atherosclerotic heart disease of native coronary artery without angina pectoris: Secondary | ICD-10-CM

## 2020-12-01 DIAGNOSIS — K838 Other specified diseases of biliary tract: Secondary | ICD-10-CM

## 2020-12-01 NOTE — Progress Notes (Signed)
Jason Lame, MD [address]  [place]  [place], [place] [address]  Main: [phone]  Fax: [phone]    Gastroenterology Virtual/Video Visit  Referring Provider:     Jinny Sanders, MD Primary Care Physician:  Jason Sanders, MD Primary Gastroenterologist:  Jason Solomon Reason for Consultation:     Follow-up after a MRCP        HPI:    Virtual Visit via Video Note Location of the patient: Home Location of provider: Home Participating persons: The patient, His wife and myself  I connected with Jason Solomon on 12/01/20 at  9:00 AM EDT by a video enabled telemedicine application and verified that I am speaking with the correct person using two identifiers.   I discussed the limitations of evaluation and management by telemedicine and the availability of in person appointments. The patient expressed understanding and agreed to proceed.  Verbal consent to proceed obtained.  History of Present Illness: Jason Solomon is a 80 y.o. male referred by Dr. Jinny Sanders, MD  for consultation & management of Abnormal MRCP.  The patient to come to see me because of a report of itching and jaundice.  The patient was set up for an MRCP the came back that showed a 3.6 By 2.2 x 1.8 cm mass centered within the occluded mid common bile duct.  It was reported that this was highly suspicious for collagenous carcinoma and recommended an ERCP with brushings.  The patient also was found to have numerous stones throughout the entire length of the common bile duct.  The patient reports that he has been taking Benadryl and reports that his itching is somewhat better.  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   COPD (chronic obstructive pulmonary disease) ([place])    DIVERTICULOSIS, COLON 01/12/2007   EUSTACHIAN TUBE DYSFUNCTION, BILATERAL 12/28/2006   GERD (gastroesophageal reflux disease)    GOITER, MULTINODULAR 05/04/2010   GOUT 01/12/2007    Heart murmur    childhood only   HYPERCHOLESTEROLEMIA 06/20/2007   Hyperlipidemia    HYPERTENSION 01/12/2007   HYPERTHYROIDISM 04/24/2010   OBESITY 01/12/2007   PROSTATE SPECIFIC ANTIGEN, ELEVATED 10/08/2009   Renal calculi    Shortness of breath dyspnea    with exertion    Past Surgical History:  Procedure Laterality Date   CARDIOVASCULAR STRESS TEST  2006   negative   HAND SURGERY  1949   left hand laceration   HERNIA REPAIR  1990's   right and left inguinal hernia   KNEE SURGERY  1990's   TONSILLECTOMY  1945    Prior to Admission medications   Medication Sig Start Date Solomon Date Taking? Authorizing Provider  allopurinol (ZYLOPRIM) 300 MG tablet TAKE 1 TABLET BY MOUTH  DAILY 02/14/20   Solomon, Jason E, MD  ANORO ELLIPTA 62.5-25 MCG/INH AEPB INHALE 1 INHALATION BY  MOUTH INTO THE LUNGS IN THE MORNING 11/26/20   Solomon, Jason E, MD  aspirin EC 81 MG tablet Take 1 tablet (81 mg total) by mouth daily. Swallow whole. 04/16/20   Solomon, Jason Gave, MD  fexofenadine (ALLEGRA) 180 MG tablet Take 180 mg by mouth daily.    [provider]  Halobetasol Propionate (ULTRAVATE) 0.05 % LOTN APPLY ON THE SKIN DAILY. AVOID FACE, GROIN, AND UNDERARMS 09/12/20   Solomon, Jason E, MD  hydrochlorothiazide (HYDRODIURIL) 25 MG tablet TAKE 1 TABLET BY MOUTH  DAILY 03/19/20   Jason Sanders, MD  hydrOXYzine (ATARAX/VISTARIL) 10 MG tablet Take 1 tablet (10 mg total) by mouth 3 (three) times daily as needed for itching. 11/05/20   Solomon, Jason E, MD  isosorbide mononitrate (IMDUR) 30 MG 24 hr tablet Take 0.5 tablets (15 mg total) by mouth daily. 11/11/20 02/09/21  Jason Mu, PA-C  levothyroxine (SYNTHROID) 137 MCG tablet TAKE 1 TABLET BY MOUTH  DAILY BEFORE BREAKFAST 03/13/20   Jason Shin, MD  metoprolol succinate (TOPROL XL) 25 MG 24 hr tablet Take 0.5 tablets (12.5 mg total) by mouth daily. 11/12/20   Jason Epley, MD  omeprazole (PRILOSEC OTC) 20 MG tablet Take 20 mg by mouth daily.    [provider]  polyethylene glycol (MIRALAX / GLYCOLAX) packet Take 17 g by mouth daily as needed.    [provider]  terazosin (HYTRIN) 5 MG capsule TAKE 1 CAPSULE BY MOUTH  DAILY 02/14/20   Jason Sanders, MD    Family History  Problem Relation Age of Onset   Hypertension Mother    COPD Brother    Lung cancer Brother 59   Throat cancer Brother    Hypertension Father    Stroke Father    Cancer Maternal Grandmother        unknown   Thyroid disease Neg Hx      Social History   Tobacco Use   Smoking status: Former    Packs/day: 2.00    Years: 30.00    Pack years: 60.00    Types: Cigarettes    Quit date: 1990    Years since quitting: 32.7   Smokeless tobacco: Never  Vaping Use   Vaping Use: Never used  Substance Use Topics   Alcohol use: Not Currently    Alcohol/week: 5.0 standard drinks    Types: 5 Glasses of wine per week   Drug use: No    Allergies as of 12/01/2020 - Review Complete 11/19/2020  Allergen Reaction Noted   Ace inhibitors  04/06/2010   Angiotensin receptor blockers  04/06/2010   Quinolones  08/13/2020    Review of Systems:    All systems reviewed and negative except where noted in HPI.   Observations/Objective:  Labs: CBC    Component Value Date/Time   WBC 3.6 08/11/2020 1302   WBC 3.8 (L) 10/07/2017 1151   RBC 4.76 08/11/2020 1302   RBC 4.59 10/07/2017 1151   HGB 14.4 08/11/2020 1302   HCT 44.0 08/11/2020 1302   PLT 179 08/11/2020 1302   MCV 92 08/11/2020 1302   MCH 30.3 08/11/2020 1302   MCHC 32.7 08/11/2020 1302   MCHC 32.7 10/07/2017 1151   RDW 12.1 08/11/2020 1302   LYMPHSABS 1.0 10/07/2017 1151   MONOABS 0.4 10/07/2017 1151   EOSABS 0.1 10/07/2017 1151   BASOSABS 0.0 10/07/2017 1151   CMP     Component Value Date/Time   NA 142 08/11/2020 1302   K 4.0 08/11/2020 1302   CL 104 08/11/2020 1302   CO2 22 08/11/2020 1302   GLUCOSE 85 08/11/2020 1302   GLUCOSE 94 01/24/2020 0956   BUN 13 08/11/2020 1302    CREATININE 1.06 08/11/2020 1302   CALCIUM 9.8 08/11/2020 1302   PROT 6.5 11/25/2020 1016   ALBUMIN 3.7 11/25/2020 1016   AST 73 (H) 11/25/2020 1016   ALT 56 (H) 11/25/2020 1016   ALKPHOS 257 (H) 11/25/2020 1016   BILITOT 8.5 (H) 11/25/2020 1016   GFRNONAA 59 (L) 08/31/2018 0711   GFRAA >60 08/31/2018 4315  Imaging Studies: MR ABDOMEN MRCP W WO CONTAST  Result Date: 11/29/2020 CLINICAL DATA:  Mild abdominal pain and nausea intermittently for 5 weeks. Abnormal liver function tests. EXAM: MRI ABDOMEN WITHOUT AND WITH CONTRAST (INCLUDING MRCP) TECHNIQUE: Multiplanar multisequence MR imaging of the abdomen was performed both before and after the administration of intravenous contrast. Heavily T2-weighted images of the biliary and pancreatic ducts were obtained, and three-dimensional MRCP images were rendered by post processing. CONTRAST:  45mL GADAVIST GADOBUTROL 1 MMOL/ML IV SOLN COMPARISON:  10/23/2020 abdominal sonogram. 05/07/2014 CT abdomen/pelvis. FINDINGS: Lower chest: No acute abnormality at the lung bases. Hepatobiliary: Normal liver size and configuration. No hepatic steatosis. Posterior right liver 1.6 cm hemangioma (series 4/image 13), stable since 05/07/2014 CT abdomen study. A few scattered subcentimeter simple liver cysts. No suspicious liver masses. Small amount of layering sludge within the nondistended gallbladder, with no definite gallstones in the gallbladder. No definite gallbladder wall thickening. No pericholecystic fluid. Mild-to-moderate diffuse intrahepatic biliary ductal dilatation. Prominent extrahepatic biliary ductal dilatation with proximal CBD diameter 20 mm. There is a poorly marginated enhancing solid 3.6 x 2.2 x 1.8 cm mass centered within and nearly occluding the mid common bile duct (series 12/image 8 and series 15/image 27). In addition, there are numerous stones throughout the entire length of the common bile duct, largest 6 mm proximally (series 12/image 7), with  the CBD stones located above and below the level of the enhancing mid CBD mass. Pancreas: At least 3 scattered tiny unilocular cystic pancreatic lesions, largest 0.5 cm in the pancreatic body (series 12/image 10), without wall thickening or enhancement. No pancreatic duct dilation. No pancreas divisum. Spleen: Normal size. No mass. Adrenals/Urinary Tract: No discrete adrenal nodules. No hydronephrosis. Several scattered simple bilateral renal cysts. No suspicious renal masses. Stomach/Bowel: Normal non-distended stomach. Visualized small and large bowel is normal caliber, with no bowel wall thickening. Marked diffuse colonic diverticulosis. Vascular/Lymphatic: Normal caliber abdominal aorta. Patent portal, splenic, hepatic and renal veins. Mildly enlarged 1.1 cm porta hepatis node (series 22/image 40). No additional pathologically enlarged abdominal lymph nodes. Other: No abdominal ascites or focal fluid collection. Musculoskeletal: No aggressive appearing focal osseous lesions. IMPRESSION: 1. Poorly marginated enhancing solid 3.6 x 2.2 x 1.8 cm mass centered within and nearly occluding the mid common bile duct, highly suspicious for extrahepatic cholangiocarcinoma. Recommend GI consultation for ERCP with brush biopsy. 2. In addition, there are numerous subcentimeter stones throughout the entire length of the common bile duct, which measures up to 20 mm proximally diameter. Mild-to-moderate diffuse intrahepatic biliary ductal dilatation. 3. Nonspecific mild porta hepatis lymphadenopathy, metastatic disease not excluded. No suspicious liver masses. 4. Marked diffuse colonic diverticulosis. 5. At least 3 scattered tiny unilocular cystic pancreatic lesions, largest 0.5 cm in the pancreatic body, without high risk features. No pancreatic duct dilation. Recommend attention on follow-up MRI abdomen without and with IV contrast in 2 years. This recommendation follows ACR consensus guidelines: Management of Incidental  Pancreatic Cysts: A White Paper of the ACR Incidental Findings Committee. [place] [id number];[id number]. These results will be called to the ordering clinician or representative by the Radiologist Assistant, and communication documented in the PACS or [employer]. Electronically Signed   By: Ilona Sorrel M.D.   On: 11/29/2020 11:53    Assessment and Plan:   Jason Solomon is a 80 y.o. y/o male has been Contacted today for the results of his MRCP which showed a lesion suspicious for a malignancy.  The patient was called and told  about the findings of a mass in the mid bile duct and that he would need a ERCP.  The next available date for an ERCP would be tomorrow and the patient has agreed to having the procedure done tomorrow.  He will be contacted with the instructions to be followed prior to the ERCP by my office staff. The patient has been explained the plan and agrees with it.  Follow Up Instructions:  I discussed the assessment and treatment plan with the patient. The patient was provided an opportunity to ask questions and all were answered. The patient agreed with the plan and demonstrated an understanding of the instructions.   The patient was advised to call back or seek an in-person evaluation if the symptoms worsen or if the condition fails to improve as anticipated.  I provided 10 minutes of non-face-to-face time during this encounter.   Jason Lame, MD  Speech recognition software was used to dictate the above note.

## 2020-12-01 NOTE — H&P (View-Only) (Signed)
Lucilla Lame, MD [address]  [place]  [place], [place] [address]  Main: [phone]  Fax: [phone]    Gastroenterology Virtual/Video Visit  Referring Provider:     Jinny Sanders, MD Primary Care Physician:  Jinny Sanders, MD Primary Gastroenterologist:  Dr.Hasini Peachey Allen Norris Reason for Consultation:     Follow-up after a MRCP        HPI:    Virtual Visit via Video Note Location of the patient: Home Location of provider: Home Participating persons: The patient, Jason Solomon and myself  I connected with Jason Solomon on 12/01/20 at  9:00 AM EDT by a video enabled telemedicine application and verified that I am speaking with the correct person using two identifiers.   I discussed the limitations of evaluation and management by telemedicine and the availability of in person appointments. The patient expressed understanding and agreed to proceed.  Verbal consent to proceed obtained.  History of Present Illness: Jason Solomon is a 80 y.o. male referred by Dr. Jinny Sanders, MD  for consultation & management of Abnormal MRCP.  The patient to come to see me because of a report of itching and jaundice.  The patient was set up for an MRCP the came back that showed a 3.6 By 2.2 x 1.8 cm mass centered within the occluded mid common bile duct.  It was reported that this was highly suspicious for collagenous carcinoma and recommended an ERCP with brushings.  The patient also was found to have numerous stones throughout the entire length of the common bile duct.  The patient reports that he has been taking Benadryl and reports that Jason itching is somewhat better.  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   COPD (chronic obstructive pulmonary disease) ([place])    DIVERTICULOSIS, COLON 01/12/2007   EUSTACHIAN TUBE DYSFUNCTION, BILATERAL 12/28/2006   GERD (gastroesophageal reflux disease)    GOITER, MULTINODULAR 05/04/2010   GOUT 01/12/2007    Heart murmur    childhood only   HYPERCHOLESTEROLEMIA 06/20/2007   Hyperlipidemia    HYPERTENSION 01/12/2007   HYPERTHYROIDISM 04/24/2010   OBESITY 01/12/2007   PROSTATE SPECIFIC ANTIGEN, ELEVATED 10/08/2009   Renal calculi    Shortness of breath dyspnea    with exertion    Past Surgical History:  Procedure Laterality Date   CARDIOVASCULAR STRESS TEST  2006   negative   HAND SURGERY  1949   left hand laceration   HERNIA REPAIR  1990's   right and left inguinal hernia   KNEE SURGERY  1990's   TONSILLECTOMY  1945    Prior to Admission medications   Medication Sig Start Date End Date Taking? Authorizing Provider  allopurinol (ZYLOPRIM) 300 MG tablet TAKE 1 TABLET BY MOUTH  DAILY 02/14/20   Bedsole, Amy E, MD  ANORO ELLIPTA 62.5-25 MCG/INH AEPB INHALE 1 INHALATION BY  MOUTH INTO THE LUNGS IN THE MORNING 11/26/20   Bedsole, Amy E, MD  aspirin EC 81 MG tablet Take 1 tablet (81 mg total) by mouth daily. Swallow whole. 04/16/20   End, Harrell Gave, MD  fexofenadine (ALLEGRA) 180 MG tablet Take 180 mg by mouth daily.    [provider]  Halobetasol Propionate (ULTRAVATE) 0.05 % LOTN APPLY ON THE SKIN DAILY. AVOID FACE, GROIN, AND UNDERARMS 09/12/20   Bedsole, Amy E, MD  hydrochlorothiazide (HYDRODIURIL) 25 MG tablet TAKE 1 TABLET BY MOUTH  DAILY 03/19/20   Jinny Sanders, MD  hydrOXYzine (ATARAX/VISTARIL) 10 MG tablet Take 1 tablet (10 mg total) by mouth 3 (three) times daily as needed for itching. 11/05/20   Bedsole, Amy E, MD  isosorbide mononitrate (IMDUR) 30 MG 24 hr tablet Take 0.5 tablets (15 mg total) by mouth daily. 11/11/20 02/09/21  Rise Mu, PA-C  levothyroxine (SYNTHROID) 137 MCG tablet TAKE 1 TABLET BY MOUTH  DAILY BEFORE BREAKFAST 03/13/20   Renato Shin, MD  metoprolol succinate (TOPROL XL) 25 MG 24 hr tablet Take 0.5 tablets (12.5 mg total) by mouth daily. 11/12/20   Vickie Epley, MD  omeprazole (PRILOSEC OTC) 20 MG tablet Take 20 mg by mouth daily.    [provider]  polyethylene glycol (MIRALAX / GLYCOLAX) packet Take 17 g by mouth daily as needed.    [provider]  terazosin (HYTRIN) 5 MG capsule TAKE 1 CAPSULE BY MOUTH  DAILY 02/14/20   Jinny Sanders, MD    Family History  Problem Relation Age of Onset   Hypertension Mother    COPD Brother    Lung cancer Brother 34   Throat cancer Brother    Hypertension Father    Stroke Father    Cancer Maternal Grandmother        unknown   Thyroid disease Neg Hx      Social History   Tobacco Use   Smoking status: Former    Packs/day: 2.00    Years: 30.00    Pack years: 60.00    Types: Cigarettes    Quit date: 1990    Years since quitting: 32.7   Smokeless tobacco: Never  Vaping Use   Vaping Use: Never used  Substance Use Topics   Alcohol use: Not Currently    Alcohol/week: 5.0 standard drinks    Types: 5 Glasses of wine per week   Drug use: No    Allergies as of 12/01/2020 - Review Complete 11/19/2020  Allergen Reaction Noted   Ace inhibitors  04/06/2010   Angiotensin receptor blockers  04/06/2010   Quinolones  08/13/2020    Review of Systems:    All systems reviewed and negative except where noted in HPI.   Observations/Objective:  Labs: CBC    Component Value Date/Time   WBC 3.6 08/11/2020 1302   WBC 3.8 (L) 10/07/2017 1151   RBC 4.76 08/11/2020 1302   RBC 4.59 10/07/2017 1151   HGB 14.4 08/11/2020 1302   HCT 44.0 08/11/2020 1302   PLT 179 08/11/2020 1302   MCV 92 08/11/2020 1302   MCH 30.3 08/11/2020 1302   MCHC 32.7 08/11/2020 1302   MCHC 32.7 10/07/2017 1151   RDW 12.1 08/11/2020 1302   LYMPHSABS 1.0 10/07/2017 1151   MONOABS 0.4 10/07/2017 1151   EOSABS 0.1 10/07/2017 1151   BASOSABS 0.0 10/07/2017 1151   CMP     Component Value Date/Time   NA 142 08/11/2020 1302   K 4.0 08/11/2020 1302   CL 104 08/11/2020 1302   CO2 22 08/11/2020 1302   GLUCOSE 85 08/11/2020 1302   GLUCOSE 94 01/24/2020 0956   BUN 13 08/11/2020 1302    CREATININE 1.06 08/11/2020 1302   CALCIUM 9.8 08/11/2020 1302   PROT 6.5 11/25/2020 1016   ALBUMIN 3.7 11/25/2020 1016   AST 73 (H) 11/25/2020 1016   ALT 56 (H) 11/25/2020 1016   ALKPHOS 257 (H) 11/25/2020 1016   BILITOT 8.5 (H) 11/25/2020 1016   GFRNONAA 59 (L) 08/31/2018 0711   GFRAA >60 08/31/2018 0017  Imaging Studies: MR ABDOMEN MRCP W WO CONTAST  Result Date: 11/29/2020 CLINICAL DATA:  Mild abdominal pain and nausea intermittently for 5 weeks. Abnormal liver function tests. EXAM: MRI ABDOMEN WITHOUT AND WITH CONTRAST (INCLUDING MRCP) TECHNIQUE: Multiplanar multisequence MR imaging of the abdomen was performed both before and after the administration of intravenous contrast. Heavily T2-weighted images of the biliary and pancreatic ducts were obtained, and three-dimensional MRCP images were rendered by post processing. CONTRAST:  68mL GADAVIST GADOBUTROL 1 MMOL/ML IV SOLN COMPARISON:  10/23/2020 abdominal sonogram. 05/07/2014 CT abdomen/pelvis. FINDINGS: Lower chest: No acute abnormality at the lung bases. Hepatobiliary: Normal liver size and configuration. No hepatic steatosis. Posterior right liver 1.6 cm hemangioma (series 4/image 13), stable since 05/07/2014 CT abdomen study. A few scattered subcentimeter simple liver cysts. No suspicious liver masses. Small amount of layering sludge within the nondistended gallbladder, with no definite gallstones in the gallbladder. No definite gallbladder wall thickening. No pericholecystic fluid. Mild-to-moderate diffuse intrahepatic biliary ductal dilatation. Prominent extrahepatic biliary ductal dilatation with proximal CBD diameter 20 mm. There is a poorly marginated enhancing solid 3.6 x 2.2 x 1.8 cm mass centered within and nearly occluding the mid common bile duct (series 12/image 8 and series 15/image 27). In addition, there are numerous stones throughout the entire length of the common bile duct, largest 6 mm proximally (series 12/image 7), with  the CBD stones located above and below the level of the enhancing mid CBD mass. Pancreas: At least 3 scattered tiny unilocular cystic pancreatic lesions, largest 0.5 cm in the pancreatic body (series 12/image 10), without wall thickening or enhancement. No pancreatic duct dilation. No pancreas divisum. Spleen: Normal size. No mass. Adrenals/Urinary Tract: No discrete adrenal nodules. No hydronephrosis. Several scattered simple bilateral renal cysts. No suspicious renal masses. Stomach/Bowel: Normal non-distended stomach. Visualized small and large bowel is normal caliber, with no bowel wall thickening. Marked diffuse colonic diverticulosis. Vascular/Lymphatic: Normal caliber abdominal aorta. Patent portal, splenic, hepatic and renal veins. Mildly enlarged 1.1 cm porta hepatis node (series 22/image 40). No additional pathologically enlarged abdominal lymph nodes. Other: No abdominal ascites or focal fluid collection. Musculoskeletal: No aggressive appearing focal osseous lesions. IMPRESSION: 1. Poorly marginated enhancing solid 3.6 x 2.2 x 1.8 cm mass centered within and nearly occluding the mid common bile duct, highly suspicious for extrahepatic cholangiocarcinoma. Recommend GI consultation for ERCP with brush biopsy. 2. In addition, there are numerous subcentimeter stones throughout the entire length of the common bile duct, which measures up to 20 mm proximally diameter. Mild-to-moderate diffuse intrahepatic biliary ductal dilatation. 3. Nonspecific mild porta hepatis lymphadenopathy, metastatic disease not excluded. No suspicious liver masses. 4. Marked diffuse colonic diverticulosis. 5. At least 3 scattered tiny unilocular cystic pancreatic lesions, largest 0.5 cm in the pancreatic body, without high risk features. No pancreatic duct dilation. Recommend attention on follow-up MRI abdomen without and with IV contrast in 2 years. This recommendation follows ACR consensus guidelines: Management of Incidental  Pancreatic Cysts: A White Paper of the ACR Incidental Findings Committee. [place] [id number]. These results will be called to the ordering clinician or representative by the Radiologist Assistant, and communication documented in the PACS or [employer]. Electronically Signed   By: Ilona Sorrel M.D.   On: 11/29/2020 11:53    Assessment and Plan:   Jason Solomon is a 80 y.o. y/o male has been Contacted today for the results of Jason MRCP which showed a lesion suspicious for a malignancy.  The patient was called and told  about the findings of a mass in the mid bile duct and that he would need a ERCP.  The next available date for an ERCP would be tomorrow and the patient has agreed to having the procedure done tomorrow.  He will be contacted with the instructions to be followed prior to the ERCP by my office staff. The patient has been explained the plan and agrees with it.  Follow Up Instructions:  I discussed the assessment and treatment plan with the patient. The patient was provided an opportunity to ask questions and all were answered. The patient agreed with the plan and demonstrated an understanding of the instructions.   The patient was advised to call back or seek an in-person evaluation if the symptoms worsen or if the condition fails to improve as anticipated.  I provided 10 minutes of non-face-to-face time during this encounter.   Lucilla Lame, MD  Speech recognition software was used to dictate the above note.

## 2020-12-02 ENCOUNTER — Ambulatory Visit: Payer: Medicare Other

## 2020-12-02 ENCOUNTER — Encounter
Admission: RE | Disposition: A | Discharge status: Home / Self Care | Payer: Self-pay | Source: Home / Self Care | Attending: Gastroenterology

## 2020-12-02 ENCOUNTER — Ambulatory Visit: Payer: Medicare Other | Admitting: Anesthesiology

## 2020-12-02 ENCOUNTER — Encounter: Payer: Self-pay | Admitting: Gastroenterology

## 2020-12-02 ENCOUNTER — Ambulatory Visit
Admission: RE | Admit: 2020-12-02 | Discharge: 2020-12-02 | Disposition: A | Discharge status: Home / Self Care | Payer: Medicare Other | Attending: Gastroenterology | Admitting: Gastroenterology

## 2020-12-02 DIAGNOSIS — Z888 Allergy status to other drugs, medicaments and biological substances status: Secondary | ICD-10-CM | POA: Insufficient documentation

## 2020-12-02 DIAGNOSIS — Z79899 Other long term (current) drug therapy: Secondary | ICD-10-CM | POA: Diagnosis not present

## 2020-12-02 DIAGNOSIS — K838 Other specified diseases of biliary tract: Secondary | ICD-10-CM | POA: Diagnosis not present

## 2020-12-02 DIAGNOSIS — E039 Hypothyroidism, unspecified: Secondary | ICD-10-CM | POA: Diagnosis not present

## 2020-12-02 DIAGNOSIS — Z87891 Personal history of nicotine dependence: Secondary | ICD-10-CM | POA: Insufficient documentation

## 2020-12-02 DIAGNOSIS — K831 Obstruction of bile duct: Secondary | ICD-10-CM | POA: Diagnosis not present

## 2020-12-02 DIAGNOSIS — Z881 Allergy status to other antibiotic agents status: Secondary | ICD-10-CM | POA: Insufficient documentation

## 2020-12-02 DIAGNOSIS — R932 Abnormal findings on diagnostic imaging of liver and biliary tract: Secondary | ICD-10-CM | POA: Diagnosis not present

## 2020-12-02 DIAGNOSIS — K839 Disease of biliary tract, unspecified: Secondary | ICD-10-CM | POA: Diagnosis present

## 2020-12-02 DIAGNOSIS — Z79890 Hormone replacement therapy (postmenopausal): Secondary | ICD-10-CM | POA: Diagnosis not present

## 2020-12-02 DIAGNOSIS — C240 Malignant neoplasm of extrahepatic bile duct: Secondary | ICD-10-CM | POA: Insufficient documentation

## 2020-12-02 HISTORY — PX: ERCP: SHX5425

## 2020-12-02 SURGERY — ERCP, WITH INTERVENTION IF INDICATED
Anesthesia: General

## 2020-12-02 MED ORDER — LACTATED RINGERS IV SOLN
INTRAVENOUS | Status: DC
  Administered 2020-12-02: 1000 mL via INTRAVENOUS

## 2020-12-02 MED ORDER — PROPOFOL 500 MG/50ML IV EMUL
INTRAVENOUS | Status: DC | PRN
  Administered 2020-12-02: 200 ug/kg/min via INTRAVENOUS

## 2020-12-02 MED ORDER — GLYCOPYRROLATE 0.2 MG/ML IJ SOLN
INTRAMUSCULAR | Status: DC | PRN
  Administered 2020-12-02: .2 mg via INTRAVENOUS

## 2020-12-02 MED ORDER — INDOMETHACIN 50 MG RE SUPP
RECTAL | Status: AC
  Filled 2020-12-02: qty 1

## 2020-12-02 MED ORDER — SODIUM CHLORIDE 0.9 % IV SOLN: INTRAVENOUS | Status: DC

## 2020-12-02 MED ORDER — INDOMETHACIN 50 MG RE SUPP
100.0000 mg | Freq: Once | RECTAL | Status: AC
  Administered 2020-12-02: 100 mg via RECTAL

## 2020-12-02 NOTE — Interval H&P Note (Signed)
Lucilla Lame, MD [address] [place], [place] [address] Phone:[phone] Fax : [phone]  Primary Care Physician:  Jinny Sanders, MD Primary Gastroenterologist:  Dr. Allen Norris  Pre-Procedure History & Physical: HPI:  Jason Solomon is a 80 y.o. male is here for an ERCP.   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   COPD (chronic obstructive pulmonary disease) ([place])    DIVERTICULOSIS, COLON 01/12/2007   EUSTACHIAN TUBE DYSFUNCTION, BILATERAL 12/28/2006   GERD (gastroesophageal reflux disease)    GOITER, MULTINODULAR 05/04/2010   GOUT 01/12/2007   Heart murmur    childhood only   HYPERCHOLESTEROLEMIA 06/20/2007   Hyperlipidemia    HYPERTENSION 01/12/2007   HYPERTHYROIDISM 04/24/2010   OBESITY 01/12/2007   PROSTATE SPECIFIC ANTIGEN, ELEVATED 10/08/2009   Renal calculi    Shortness of breath dyspnea    with exertion    Past Surgical History:  Procedure Laterality Date   CARDIOVASCULAR STRESS TEST  2006   negative   HAND SURGERY  1949   left hand laceration   HERNIA REPAIR  1990's   right and left inguinal hernia   KNEE SURGERY  1990's   TONSILLECTOMY  1945    Prior to Admission medications   Medication Sig Start Date End Date Taking? Authorizing Provider  allopurinol (ZYLOPRIM) 300 MG tablet TAKE 1 TABLET BY MOUTH  DAILY 02/14/20  Yes Bedsole, Amy E, MD  ANORO ELLIPTA 62.5-25 MCG/INH AEPB INHALE 1 INHALATION BY  MOUTH INTO THE LUNGS IN THE MORNING 11/26/20  Yes Bedsole, Amy E, MD  aspirin EC 81 MG tablet Take 1 tablet (81 mg total) by mouth daily. Swallow whole. 04/16/20  Yes End, Harrell Gave, MD  fexofenadine (ALLEGRA) 180 MG tablet Take 180 mg by mouth daily.   Yes [provider]  Halobetasol Propionate (ULTRAVATE) 0.05 % LOTN APPLY ON THE SKIN DAILY. AVOID FACE, GROIN, AND UNDERARMS 09/12/20  Yes Bedsole, Amy E, MD  hydrochlorothiazide (HYDRODIURIL) 25 MG tablet TAKE 1 TABLET BY MOUTH  DAILY 03/19/20  Yes  Bedsole, Amy E, MD  hydrOXYzine (ATARAX/VISTARIL) 10 MG tablet Take 1 tablet (10 mg total) by mouth 3 (three) times daily as needed for itching. 11/05/20  Yes Bedsole, Amy E, MD  isosorbide mononitrate (IMDUR) 30 MG 24 hr tablet Take 0.5 tablets (15 mg total) by mouth daily. 11/11/20 02/09/21 Yes Dunn, Areta Haber, PA-C  levothyroxine (SYNTHROID) 137 MCG tablet TAKE 1 TABLET BY MOUTH  DAILY BEFORE BREAKFAST 03/13/20  Yes Renato Shin, MD  metoprolol succinate (TOPROL XL) 25 MG 24 hr tablet Take 0.5 tablets (12.5 mg total) by mouth daily. 11/12/20  Yes Vickie Epley, MD  omeprazole (PRILOSEC OTC) 20 MG tablet Take 20 mg by mouth daily.   Yes [provider]  polyethylene glycol (MIRALAX / GLYCOLAX) packet Take 17 g by mouth daily as needed.   Yes [provider]  terazosin (HYTRIN) 5 MG capsule TAKE 1 CAPSULE BY MOUTH  DAILY 02/14/20  Yes Bedsole, Amy E, MD    Allergies as of 12/01/2020 - Review Complete 12/01/2020  Allergen Reaction Noted   Ace inhibitors  04/06/2010   Angiotensin receptor blockers  04/06/2010   Quinolones  08/13/2020    Family History  Problem Relation Age of Onset   Hypertension Mother    COPD Brother    Lung cancer Brother 69   Throat cancer Brother    Hypertension Father    Stroke Father  Cancer Maternal Grandmother        unknown   Thyroid disease Neg Hx     Social History   Socioeconomic History   Marital status: Married    Spouse name: Not on file   Number of children: 4   Years of education: Not on file   Highest education level: Not on file  Occupational History    Employer: RETIRED    Comment: Retired  Tobacco Use   Smoking status: Former    Packs/day: 2.00    Years: 30.00    Pack years: 60.00    Types: Cigarettes    Quit date: 1990    Years since quitting: 32.7   Smokeless tobacco: Never  Vaping Use   Vaping Use: Never used  Substance and Sexual Activity   Alcohol use: Yes    Alcohol/week: 5.0 standard drinks     Types: 5 Glasses of wine per week   Drug use: No   Sexual activity: Never  Other Topics Concern   Not on file  Social History Narrative   Regular exercise-yes, walks , golf   Occupation: Retired Hydrographic surveyor   Diet: (+) fruit, (+) veggies, (+) salad, (+) H20   Children: 4, healthy   Married x 43 years   Living will, [place], daughter. Full code. (reviewed 2013)   Social Determinants of Health   Financial Resource Strain: Not on file  Food Insecurity: Not on file  Transportation Needs: Not on file  Physical Activity: Not on file  Stress: Not on file  Social Connections: Not on file  Intimate Partner Violence: Not on file    Review of Systems: See HPI, otherwise negative ROS  Physical Exam: BP (!) 155/71   Pulse (!) 45   Temp (!) 96 F (35.6 C) (Temporal)   Resp 16   Ht 6\' 1"  (1.854 m)   Wt 109.8 kg   SpO2 99%   BMI 31.93 kg/m  General:   Alert,  pleasant and cooperative in NAD Head:  Normocephalic and atraumatic. Neck:  Supple; no masses or thyromegaly. Lungs:  Clear throughout to auscultation.    Heart:  Regular rate and rhythm. Abdomen:  Soft, nontender and nondistended. Normal bowel sounds, without guarding, and without rebound.   Neurologic:  Alert and  oriented x4;  grossly normal neurologically.  Impression/Plan: Jason Solomon is here for an ERCP to be performed for bile duct mass.  Risks, benefits, limitations, and alternatives regarding  ERCP have been reviewed with the patient.  Questions have been answered.  All parties agreeable.   Lucilla Lame, MD  12/02/2020, 10:42 AM

## 2020-12-02 NOTE — Transfer of Care (Signed)
Immediate Anesthesia Transfer of Care Note  Patient: Jason Solomon  Procedure(s) Performed: ENDOSCOPIC RETROGRADE CHOLANGIOPANCREATOGRAPHY (ERCP)  Patient Location: PACU  Anesthesia Type:General  Level of Consciousness: awake, alert  and oriented  Airway & Oxygen Therapy: Patient Spontanous Breathing and Patient connected to nasal cannula oxygen  Post-op Assessment: Report given to RN and Post -op Vital signs reviewed and stable  Post vital signs: Reviewed and stable  Last Vitals:  Vitals Value Taken Time  BP 112/73 12/02/20 1142  Temp    Pulse 74 12/02/20 1146  Resp 14 12/02/20 1146  SpO2 89 % 12/02/20 1146  Vitals shown include unvalidated device data.  Last Pain:  Vitals:   12/02/20 1142  TempSrc:   PainSc: 0-No pain         Complications: No notable events documented.

## 2020-12-02 NOTE — Anesthesia Preprocedure Evaluation (Signed)
Anesthesia Evaluation  Patient identified by MRN, date of birth, ID band Patient awake    Reviewed: Allergy & Precautions, NPO status , Patient's Chart, lab work & pertinent test results  Airway Mallampati: III  TM Distance: >3 FB Neck ROM: Full    Dental  (+) Partial Lower   Pulmonary shortness of breath, COPD, former smoker,    Pulmonary exam normal        Cardiovascular hypertension, + CAD (Neg Stress)  Normal cardiovascular exam+ dysrhythmias + Valvular Problems/Murmurs      Neuro/Psych negative neurological ROS  negative psych ROS   GI/Hepatic Neg liver ROS, GERD  Medicated,  Endo/Other  Hypothyroidism   Renal/GU Renal disease  negative genitourinary   Musculoskeletal negative musculoskeletal ROS (+)   Abdominal   Peds negative pediatric ROS (+)  Hematology negative hematology ROS (+)   Anesthesia Other Findings Angioedema  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 COPD (chronic obstructive pulmonary disease) ([place])    DIVERTICULOSIS, COLON 01/12/2007 EUSTACHIAN TUBE DYSFUNCTION, BILATERAL 12/28/2006   GERD (gastroesophageal reflux disease) GOITER, MULTINODULAR 05/04/2010 GOUT 01/12/2007   Heart murmur  childhood only  HYPERCHOLESTEROLEMIA 06/20/2007   Hyperlipidemia    HYPERTENSION 01/12/2007  HYPERTHYROIDISM 04/24/2010  OBESITY 01/12/2007   PROSTATE SPECIFIC ANTIGEN, ELEVATED 10/08/2009   Renal calculi    Shortness of breath dyspnea  with exertion     Reproductive/Obstetrics negative OB ROS                             Anesthesia Physical Anesthesia Plan  ASA: 3  Anesthesia Plan: General   Post-op Pain Management:    Induction: Intravenous  PONV Risk Score and Plan: 2 and TIVA and Propofol infusion  Airway Management Planned: Mask and Oral ETT  Additional Equipment:   Intra-op Plan:   Post-operative Plan:   Informed Consent: I have reviewed the patients History and Physical,  chart, labs and discussed the procedure including the risks, benefits and alternatives for the proposed anesthesia with the patient or authorized representative who has indicated his/her understanding and acceptance.       Plan Discussed with: CRNA, Anesthesiologist and Surgeon  Anesthesia Plan Comments:         Anesthesia Quick Evaluation

## 2020-12-02 NOTE — Anesthesia Postprocedure Evaluation (Signed)
Anesthesia Post Note  Patient: Jason Solomon  Procedure(s) Performed: ENDOSCOPIC RETROGRADE CHOLANGIOPANCREATOGRAPHY (ERCP)  Patient location during evaluation: Phase II Anesthesia Type: General Level of consciousness: awake and alert, awake and oriented Pain management: pain level controlled Vital Signs Assessment: post-procedure vital signs reviewed and stable Respiratory status: spontaneous breathing, nonlabored ventilation and respiratory function stable Cardiovascular status: blood pressure returned to baseline and stable Postop Assessment: no apparent nausea or vomiting Anesthetic complications: no   No notable events documented.   Last Vitals:  Vitals:   12/02/20 1150 12/02/20 1200  BP: 138/89 (!) 158/88  Pulse: (!) 49   Resp: 12   Temp:    SpO2: 97% 95%    Last Pain:  Vitals:   12/02/20 1200  TempSrc:   PainSc: 0-No pain                 Phill Mutter

## 2020-12-02 NOTE — Op Note (Signed)
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 Procedure:             Pre-Anesthesia Assessment:                        - Prior to the procedure, a History and Physical was                         performed, and patient medications and allergies were                         reviewed. The patient's tolerance of previous                         anesthesia was also reviewed. The risks and benefits                         of the procedure and the sedation options and risks                         were discussed with the patient. All questions were                         answered, and informed consent was obtained. Prior                         Anticoagulants: The patient has taken no previous                         anticoagulant or antiplatelet agents. ASA Grade                         Assessment: II - A patient with mild systemic disease.                         After reviewing the risks and benefits, the patient                         was deemed in satisfactory condition to undergo the                         procedure.                        After obtaining informed consent, the scope was passed  under direct vision. Throughout the procedure, the                         patient's blood pressure, pulse, and oxygen                         saturations were monitored continuously. The [employer] D single use  duodenoscope was                         introduced through the mouth, and used to inject                         contrast into and used to inject contrast into the                         bile duct. The [employer] D single                         use duodenoscope was introduced through the and used                         to inject contrast into. The ERCP was accomplished                         without difficulty. The patient tolerated the                         procedure well. Findings:      The scout film was normal. The esophagus was successfully intubated       under direct vision. The scope was advanced to a normal major papilla in       the descending duodenum without detailed examination of the pharynx,       larynx and associated structures, and upper GI tract. The upper GI tract       was grossly normal. The bile duct was deeply cannulated with the       short-nosed traction sphincterotome. Contrast was injected. I personally       interpreted the bile duct images. There was brisk flow of contrast       through the ducts. Image quality was excellent. Contrast extended to the       entire biliary tree. The middle third of the main bile duct contained a       single segmental stenosis 20 mm in length. The main bile duct was       diffusely dilated. A wire was passed into the biliary tree. A 7 mm       biliary sphincterotomy was made with a traction (standard)       sphincterotome using ERBE electrocautery. There was no       post-sphincterotomy bleeding. Cells for cytology were obtained by       brushing in the middle third of the main bile duct. One 10 Fr by 9 cm       plastic stent with a single external flap was placed 7 cm into the       common bile duct. Bile flowed  through the stent. The stent was in good       position. 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                        - The entire main bile duct was dilated.                        - A biliary sphincterotomy was performed.                        - Cells for cytology obtained in the middle third of                         the main bile duct.                        - One plastic stent was placed into the common bile                         duct. Recommendation:        - Discharge patient to home.                        - Clear liquid diet.                        - Await cytology results.                        - Return to this GI lab for stent exchange at ERCP in                         3 months. Procedure Code(s):     --- Professional ---                        [phone], Endoscopic retrograde cholangiopancreatography                         (ERCP); with placement of endoscopic stent into                         biliary or pancreatic duct, including pre- and                         post-dilation and guide wire passage, when performed,                         including sphincterotomy, when performed, each stent                        [id number], Endoscopic catheterization of the biliary  ductal system, radiological supervision and                         interpretation Diagnosis Code(s):     --- Professional ---                        D49.0, Neoplasm of unspecified behavior of digestive                         system                        K83.1, Obstruction of bile duct                        K83.8, Other specified diseases of biliary tract CPT copyright 2019 American Medical Association. All rights reserved. The codes documented in this report are preliminary and upon coder review may  be revised to meet current compliance requirements. Lucilla Lame MD, MD 12/02/2020 11:38:32 AM This report has been signed electronically. Number of Addenda: 0 Note Initiated On: 12/02/2020 10:41 AM Estimated  Blood Loss:  Estimated blood loss: none.      [facility]

## 2020-12-02 NOTE — Progress Notes (Signed)
Common bile duct brushing for Cytology performed by Dr Allen Norris during ERCP.  Specimen order placed in Epic and the specimen delivered to the [facility] lab.

## 2020-12-02 NOTE — Addendum Note (Signed)
Addendum  created 12/02/20 1241 by Nelda Marseille, CRNA   Intraprocedure Event edited

## 2020-12-03 ENCOUNTER — Other Ambulatory Visit: Payer: Self-pay | Admitting: Anatomic Pathology & Clinical Pathology

## 2020-12-03 LAB — CYTOLOGY - NON PAP

## 2020-12-04 ENCOUNTER — Encounter: Payer: Self-pay | Admitting: Gastroenterology

## 2020-12-08 ENCOUNTER — Other Ambulatory Visit: Payer: Self-pay

## 2020-12-08 ENCOUNTER — Encounter: Payer: Self-pay | Admitting: Gastroenterology

## 2020-12-08 ENCOUNTER — Ambulatory Visit (INDEPENDENT_AMBULATORY_CARE_PROVIDER_SITE_OTHER): Payer: Medicare Other | Admitting: Gastroenterology

## 2020-12-08 VITALS — BP 135/62 | HR 50 | Temp 97.3°F | Ht 73.0 in | Wt 249.2 lb

## 2020-12-08 DIAGNOSIS — I2510 Atherosclerotic heart disease of native coronary artery without angina pectoris: Secondary | ICD-10-CM

## 2020-12-08 DIAGNOSIS — C240 Malignant neoplasm of extrahepatic bile duct: Secondary | ICD-10-CM | POA: Diagnosis not present

## 2020-12-08 NOTE — Progress Notes (Signed)
Primary Care Physician: Jinny Sanders, MD  Primary Gastroenterologist:  Dr. Lucilla Lame  Chief Complaint  Patient presents with   Follow up ERCP results    HPI: Jason Solomon is a 80 y.o. male here for follow-up after having ERCP.  The patient had an ERCP for a lesion seen on imaging consistent with a possible neoplasm. 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  The patient had seen the results on his chart and comes today with his daughter who is a pediatrician and his son-in-law who is a Stage manager.  He reports that his nausea is much better and his itching has resolved.  He also reports that his urine has ceased to be dark.  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   GERD (gastroesophageal reflux disease)    GOITER, MULTINODULAR 05/04/2010   GOUT 01/12/2007   Heart murmur    childhood only   HYPERCHOLESTEROLEMIA 06/20/2007   Hyperlipidemia    HYPERTENSION 01/12/2007   HYPERTHYROIDISM 04/24/2010   OBESITY 01/12/2007   PROSTATE SPECIFIC ANTIGEN, ELEVATED 10/08/2009   Renal calculi    Shortness of breath dyspnea    with exertion    Current Outpatient Medications  Medication Sig Dispense Refill   allopurinol (ZYLOPRIM) 300 MG tablet TAKE 1 TABLET BY MOUTH  DAILY 90 tablet 3   ANORO ELLIPTA 62.5-25 MCG/INH AEPB INHALE 1 INHALATION BY  MOUTH INTO THE LUNGS IN THE MORNING 180 each 0   aspirin EC 81 MG tablet Take 1 tablet (81 mg total) by mouth daily. Swallow whole. 90 tablet 3   fexofenadine (ALLEGRA) 180 MG tablet Take 180 mg by mouth daily.     Halobetasol Propionate (ULTRAVATE) 0.05 % LOTN APPLY ON THE SKIN DAILY. AVOID FACE, GROIN, AND UNDERARMS 60 mL 0   hydrochlorothiazide (HYDRODIURIL) 25 MG tablet TAKE 1 TABLET BY MOUTH  DAILY  90 tablet 3   hydrOXYzine (ATARAX/VISTARIL) 10 MG tablet Take 1 tablet (10 mg total) by mouth 3 (three) times daily as needed for itching. 30 tablet 0   isosorbide mononitrate (IMDUR) 30 MG 24 hr tablet Take 0.5 tablets (15 mg total) by mouth daily. 45 tablet 3   levothyroxine (SYNTHROID) 137 MCG tablet TAKE 1 TABLET BY MOUTH  DAILY BEFORE BREAKFAST 90 tablet 3   metoprolol succinate (TOPROL XL) 25 MG 24 hr tablet Take 0.5 tablets (12.5 mg total) by mouth daily. 45 tablet 3   omeprazole (PRILOSEC OTC) 20 MG tablet Take 20 mg by mouth daily.     polyethylene glycol (MIRALAX / GLYCOLAX) packet Take 17 g by mouth daily as needed.     terazosin (HYTRIN) 5 MG capsule TAKE 1 CAPSULE BY MOUTH  DAILY 90 capsule 3   No current facility-administered medications for this visit.    Allergies as of 12/08/2020 - Review Complete 12/08/2020  Allergen Reaction Noted   Ace inhibitors  04/06/2010   Angiotensin receptor blockers  04/06/2010   Quinolones  08/13/2020    ROS:  General: Negative for anorexia, weight loss, fever, chills, fatigue, weakness. ENT: Negative for hoarseness, difficulty swallowing , nasal congestion. CV: Negative for chest pain, angina, palpitations, dyspnea on exertion, peripheral edema.  Respiratory: Negative for dyspnea at rest, dyspnea on exertion, cough, sputum, wheezing.  GI:  See history of present illness. GU:  Negative for dysuria, hematuria, urinary incontinence, urinary frequency, nocturnal urination.  Endo: Negative for unusual weight change.    Physical Examination:   BP 135/62 (BP Location: Left Arm, Patient Position: Sitting, Cuff Size: Large)   Pulse (!) 50   Temp (!) 97.3 F (36.3 C) (Temporal)   Ht 6\' 1"  (1.854 m)   Wt 249 lb 3.2 oz (113 kg)   BMI 32.88 kg/m   General: Well-nourished, well-developed in no acute distress.  Eyes: No icterus. Conjunctivae pink. Neuro: Alert and oriented x 3.  Grossly intact. Skin: Warm and dry, no jaundice.   Psych: Alert  and cooperative, normal mood and affect.  Labs:    Imaging Studies: DG C-Arm 1-60 Min-No Report  Result Date: 12/02/2020 Fluoroscopy was utilized by the requesting physician.  No radiographic interpretation.   MR ABDOMEN MRCP W WO CONTAST  Result Date: 11/29/2020 CLINICAL DATA:  Mild abdominal pain and nausea intermittently for 5 weeks. Abnormal liver function tests. EXAM: MRI ABDOMEN WITHOUT AND WITH CONTRAST (INCLUDING MRCP) TECHNIQUE: Multiplanar multisequence MR imaging of the abdomen was performed both before and after the administration of intravenous contrast. Heavily T2-weighted images of the biliary and pancreatic ducts were obtained, and three-dimensional MRCP images were rendered by post processing. CONTRAST:  20mL GADAVIST GADOBUTROL 1 MMOL/ML IV SOLN COMPARISON:  10/23/2020 abdominal sonogram. 05/07/2014 CT abdomen/pelvis. FINDINGS: Lower chest: No acute abnormality at the lung bases. Hepatobiliary: Normal liver size and configuration. No hepatic steatosis. Posterior right liver 1.6 cm hemangioma (series 4/image 13), stable since 05/07/2014 CT abdomen study. A few scattered subcentimeter simple liver cysts. No suspicious liver masses. Small amount of layering sludge within the nondistended gallbladder, with no definite gallstones in the gallbladder. No definite gallbladder wall thickening. No pericholecystic fluid. Mild-to-moderate diffuse intrahepatic biliary ductal dilatation. Prominent extrahepatic biliary ductal dilatation with proximal CBD diameter 20 mm. There is a poorly marginated enhancing solid 3.6 x 2.2 x 1.8 cm mass centered within and nearly occluding the mid common bile duct (series 12/image 8 and series 15/image 27). In addition, there are numerous stones throughout the entire length of the common bile duct, largest 6 mm proximally (series 12/image 7), with the CBD stones located above and below the level of the enhancing mid CBD mass. Pancreas: At least 3 scattered tiny  unilocular cystic pancreatic lesions, largest 0.5 cm in the pancreatic body (series 12/image 10), without wall thickening or enhancement. No pancreatic duct dilation. No pancreas divisum. Spleen: Normal size. No mass. Adrenals/Urinary Tract: No discrete adrenal nodules. No hydronephrosis. Several scattered simple bilateral renal cysts. No suspicious renal masses. Stomach/Bowel: Normal non-distended stomach. Visualized small and large bowel is normal caliber, with no bowel wall thickening. Marked diffuse colonic diverticulosis. Vascular/Lymphatic: Normal caliber abdominal aorta. Patent portal, splenic, hepatic and renal veins. Mildly enlarged 1.1 cm porta hepatis node (series 22/image 40). No additional pathologically enlarged abdominal lymph nodes. Other: No abdominal ascites or focal fluid collection. Musculoskeletal: No aggressive appearing focal osseous lesions. IMPRESSION: 1. Poorly marginated enhancing solid 3.6 x 2.2 x 1.8 cm mass centered within and nearly occluding the mid common bile duct, highly suspicious for extrahepatic cholangiocarcinoma. Recommend GI consultation for ERCP with brush biopsy. 2. In addition, there are numerous subcentimeter stones throughout the entire length of the common bile duct, which measures up to 20 mm proximally diameter. Mild-to-moderate diffuse intrahepatic biliary ductal dilatation. 3. Nonspecific mild porta hepatis lymphadenopathy, metastatic disease not excluded. No suspicious liver masses. 4. Marked diffuse colonic  diverticulosis. 5. At least 3 scattered tiny unilocular cystic pancreatic lesions, largest 0.5 cm in the pancreatic body, without high risk features. No pancreatic duct dilation. Recommend attention on follow-up MRI abdomen without and with IV contrast in 2 years. This recommendation follows ACR consensus guidelines: Management of Incidental Pancreatic Cysts: A White Paper of the ACR Incidental Findings Committee. [place] 3546;[id number]. These results  will be called to the ordering clinician or representative by the Radiologist Assistant, and communication documented in the PACS or [employer]. Electronically Signed   By: Ilona Sorrel M.D.   On: 11/29/2020 11:53    Assessment and Plan:   Jason Solomon is a 79 y.o. 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  The patient has been told the findings and recommended to follow up with oncology who will determine whether the patient needs to be sent to surgery for resection versus chemotherapy/radiation.  It appears that the patient has a already been put on the tumor board conference coming up this week. The patient and his family have been given a chance to ask and have there questions answered.  The patient will follow up in 3 months for a repeat ERCP for stent removal and earlier if hematology/oncology determine that mortician needs to be samples for genetic testing.  The patient and his family have been explained the plan and agree with it.     Lucilla Lame, MD. Marval Regal    Note: This dictation was prepared with Dragon dictation along with smaller phrase technology. Any transcriptional errors that result from this process are unintentional.

## 2020-12-11 ENCOUNTER — Other Ambulatory Visit: Payer: Medicare Other

## 2020-12-11 NOTE — Progress Notes (Signed)
Tumor Board Documentation  BOLIVAR KORANDA was presented by Verlon Au, RN, OCN at our Tumor Board on 12/11/2020, which included representatives from radiation oncology, medical oncology, surgical, radiology, pathology, navigation, internal medicine, research, genetics, pulmonology.  [place] currently presents as a new patient, for [place], for new positive pathology with history of the following treatments: surgical intervention(s).  Additionally, we reviewed previous medical and familial history, history of present illness, and recent lab results along with all available histopathologic and imaging studies. The tumor board considered available treatment options and made the following recommendations:   Pt will see Medical Oncology 12/12/20 and plan discussed then  The following procedures/referrals were also placed: No orders of the defined types were placed in this encounter.   Clinical Trial Status: not discussed   Staging used: To be determined AJCC Staging:       Group: Adenocarcinoma of Common Bile Duct   National site-specific guidelines   were discussed with respect to the case.  Tumor board is a meeting of clinicians from various specialty areas who evaluate and discuss patients for whom a multidisciplinary approach is being considered. Final determinations in the plan of care are those of the provider(s). The responsibility for follow up of recommendations given during tumor board is that of the provider.   Today's extended care, comprehensive team conference, Teddie was not present for the discussion and was not examined.   Multidisciplinary Tumor Board is a multidisciplinary case peer review process.  Decisions discussed in the Multidisciplinary Tumor Board reflect the opinions of the specialists present at the conference without having examined the patient.  Ultimately, treatment and diagnostic decisions rest with the primary provider(s) and the patient.

## 2020-12-12 ENCOUNTER — Encounter: Payer: Self-pay | Admitting: Oncology

## 2020-12-12 ENCOUNTER — Inpatient Hospital Stay: Payer: Medicare Other

## 2020-12-12 ENCOUNTER — Other Ambulatory Visit: Payer: Self-pay

## 2020-12-12 ENCOUNTER — Inpatient Hospital Stay: Payer: Medicare Other | Attending: Oncology | Admitting: Oncology

## 2020-12-12 VITALS — HR 48 | Temp 97.7°F | Resp 18 | Wt 250.0 lb

## 2020-12-12 DIAGNOSIS — R748 Abnormal levels of other serum enzymes: Secondary | ICD-10-CM | POA: Diagnosis not present

## 2020-12-12 DIAGNOSIS — C227 Other specified carcinomas of liver: Secondary | ICD-10-CM

## 2020-12-12 DIAGNOSIS — C221 Intrahepatic bile duct carcinoma: Secondary | ICD-10-CM | POA: Insufficient documentation

## 2020-12-12 DIAGNOSIS — Z87891 Personal history of nicotine dependence: Secondary | ICD-10-CM | POA: Diagnosis not present

## 2020-12-12 DIAGNOSIS — R933 Abnormal findings on diagnostic imaging of other parts of digestive tract: Secondary | ICD-10-CM | POA: Insufficient documentation

## 2020-12-12 DIAGNOSIS — C7B02 Secondary carcinoid tumors of liver: Secondary | ICD-10-CM | POA: Diagnosis not present

## 2020-12-12 DIAGNOSIS — I491 Atrial premature depolarization: Secondary | ICD-10-CM | POA: Diagnosis not present

## 2020-12-12 DIAGNOSIS — I2510 Atherosclerotic heart disease of native coronary artery without angina pectoris: Secondary | ICD-10-CM

## 2020-12-12 DIAGNOSIS — I700 Atherosclerosis of aorta: Secondary | ICD-10-CM

## 2020-12-12 DIAGNOSIS — Z79899 Other long term (current) drug therapy: Secondary | ICD-10-CM | POA: Diagnosis not present

## 2020-12-12 DIAGNOSIS — R932 Abnormal findings on diagnostic imaging of liver and biliary tract: Secondary | ICD-10-CM | POA: Insufficient documentation

## 2020-12-12 DIAGNOSIS — K838 Other specified diseases of biliary tract: Secondary | ICD-10-CM

## 2020-12-12 DIAGNOSIS — C240 Malignant neoplasm of extrahepatic bile duct: Secondary | ICD-10-CM | POA: Diagnosis not present

## 2020-12-12 LAB — CBC WITH DIFFERENTIAL/PLATELET
Abs Immature Granulocytes: 0.01 10*3/uL (ref 0.00–0.07)
Basophils Absolute: 0 10*3/uL (ref 0.0–0.1)
Basophils Relative: 1 %
Eosinophils Absolute: 0.2 10*3/uL (ref 0.0–0.5)
Eosinophils Relative: 5 %
HCT: 39.4 % (ref 39.0–52.0)
Hemoglobin: 12.6 g/dL — ABNORMAL LOW (ref 13.0–17.0)
Immature Granulocytes: 0 %
Lymphocytes Relative: 29 %
Lymphs Abs: 1.2 10*3/uL (ref 0.7–4.0)
MCH: 31.3 pg (ref 26.0–34.0)
MCHC: 32 g/dL (ref 30.0–36.0)
MCV: 97.8 fL (ref 80.0–100.0)
Monocytes Absolute: 0.4 10*3/uL (ref 0.1–1.0)
Monocytes Relative: 10 %
Neutro Abs: 2.3 10*3/uL (ref 1.7–7.7)
Neutrophils Relative %: 55 %
Platelets: 199 10*3/uL (ref 150–400)
RBC: 4.03 MIL/uL — ABNORMAL LOW (ref 4.22–5.81)
RDW: 14.1 % (ref 11.5–15.5)
WBC: 4.1 10*3/uL (ref 4.0–10.5)
nRBC: 0 % (ref 0.0–0.2)

## 2020-12-12 LAB — COMPREHENSIVE METABOLIC PANEL
ALT: 37 U/L (ref 0–44)
AST: 43 U/L — ABNORMAL HIGH (ref 15–41)
Albumin: 3.2 g/dL — ABNORMAL LOW (ref 3.5–5.0)
Alkaline Phosphatase: 99 U/L (ref 38–126)
Anion gap: 8 (ref 5–15)
BUN: 18 mg/dL (ref 8–23)
CO2: 27 mmol/L (ref 22–32)
Calcium: 9.2 mg/dL (ref 8.9–10.3)
Chloride: 107 mmol/L (ref 98–111)
Creatinine, Ser: 0.93 mg/dL (ref 0.61–1.24)
GFR, Estimated: >60 mL/min (ref >60)
Glucose, Bld: 94 mg/dL (ref 70–99)
Potassium: 3.8 mmol/L (ref 3.5–5.1)
Sodium: 142 mmol/L (ref 135–145)
Total Bilirubin: 2.7 mg/dL — ABNORMAL HIGH (ref 0.3–1.2)
Total Protein: 6.8 g/dL (ref 6.5–8.1)

## 2020-12-12 NOTE — Progress Notes (Signed)
Patient here for initial oncology appointment, expresses no complaints at this time

## 2020-12-13 LAB — AFP TUMOR MARKER: AFP, Serum, Tumor Marker: 2.3 ng/mL (ref 0.0–8.4)

## 2020-12-13 LAB — CEA: CEA: 3.8 ng/mL (ref 0.0–4.7)

## 2020-12-15 LAB — CA 19-9 (SERIAL): CA 19-9: 36 U/mL — ABNORMAL HIGH (ref 0–35)

## 2020-12-24 ENCOUNTER — Encounter: Payer: Self-pay | Admitting: Oncology

## 2020-12-24 NOTE — Progress Notes (Signed)
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 Allen Norris  Date of visit: 12/24/20   History of presenting illness- Patient is a 80 year old male with a past medical history significant for CAD hyperlipidemia hypertension among other medical problems.  He underwent right upper quadrant ultrasound which was done as a part of work-up for elevated liver enzymes.  Ultrasound showed marked intra and extrahepatic biliary ductal dilatation.  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  Numerous subcentimeter stones throughout the entire length of CBD.  Unilocular cystic pancreatic lesions.  Patient was seen by Dr. Allen Norris and underwent ERCP on 12/02/2020 which showed a single segmental biliary stricture in the middle third of the main bile duct which was malignant appearing.  Entire main bile duct dilated.  Biliary sphincterotomy was performed and brushings obtained from the middle third of the main bile duct.Cytology showed malignant cells favoring adenocarcinoma.  Insufficient tissue for ancillary testing.  CMP showed elevated total bilirubin of 8.5 with alkaline phosphatase of 257 and AST and ALT of 73 and 56 respectively prior to ERCP.  Patient is doing well at his age and  plays golf regularly.  Appetite and weight have been stable.  Denies any abdominal pain.  He is independent of his ADLs and IADLs.  ECOG PS- 1  Pain scale- 0   Review of systems- Review of Systems  Constitutional:  Negative for chills, fever, malaise/fatigue and weight loss.  HENT:  Negative for congestion, ear discharge and nosebleeds.   Eyes:  Negative for blurred vision.  Respiratory:  Negative for cough, hemoptysis, sputum production, shortness of breath and wheezing.   Cardiovascular:  Negative for chest pain, palpitations, orthopnea and claudication.  Gastrointestinal:  Negative for abdominal pain, blood in stool, constipation, diarrhea, heartburn, melena, nausea and vomiting.  Genitourinary:  Negative for dysuria, flank pain, frequency, hematuria and urgency.  Musculoskeletal:  Negative for back pain, joint pain and myalgias.  Skin:  Negative for rash.  Neurological:  Negative for dizziness, tingling, focal weakness, seizures, weakness and headaches.  Endo/Heme/Allergies:  Does not bruise/bleed easily.  Psychiatric/Behavioral:  Negative for depression and suicidal ideas. The patient does not have insomnia.    Allergies  Allergen Reactions   Ace Inhibitors     REACTION: ?angioedema   Angiotensin Receptor Blockers     REACTION: angioedema on ACE?   Quinolones     Aortic dilation    Patient Active Problem List   Diagnosis Date Noted   Common bile duct mass    Abnormal liver diagnostic imaging    Abdominal wall hernia 10/21/2020   Itching 10/21/2020   Elevated liver enzymes 10/21/2020   Skin lesion of foot 07/08/2020   PAC (premature atrial contraction) 04/16/2020   Right foot pain 01/31/2020   Gastroesophageal reflux disease 01/31/2020   First degree AV block 09/26/2019   Hyperlipidemia LDL goal <70 03/15/2019  Coronary artery disease involving native coronary artery of native heart without angina pectoris 10/04/2018   Aortic atherosclerosis ([place]) 08/24/2018    Dyspnea on exertion 07/28/2018   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   Class 1 obesity with serious comorbidity and body mass index (BMI) of 33.0 to 33.9 in adult 01/13/2016   Sinus bradycardia 03/20/2014   Counseling regarding end of life decision making 01/01/2014   Hx of tobacco use, presenting hazards to health 12/28/2012   COPD, mild ([place]) 12/28/2012   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   Hx of Thyroiditis, unspecified 05/26/2010   GOITER, MULTINODULAR 05/04/2010   PROSTATE SPECIFIC ANTIGEN, ELEVATED 10/08/2009   ALLERGIC RHINITIS 11/06/2008   HYPERCHOLESTEROLEMIA 06/20/2007   RENAL CALCULUS, URIC ACID 01/12/2007   Chronic gout 01/12/2007   Essential hypertension 01/12/2007   DIVERTICULOSIS, COLON 01/12/2007   LOW BACK PAIN, CHRONIC 12/21/2006     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   COPD (chronic obstructive pulmonary disease) ([place])    DIVERTICULOSIS, COLON 01/12/2007   EUSTACHIAN TUBE DYSFUNCTION, BILATERAL 12/28/2006   GERD (gastroesophageal reflux disease)    GOITER, MULTINODULAR 05/04/2010   GOUT 01/12/2007   Heart murmur    childhood only   HYPERCHOLESTEROLEMIA 06/20/2007   Hyperlipidemia    HYPERTENSION 01/12/2007   HYPERTHYROIDISM 04/24/2010   OBESITY 01/12/2007   PROSTATE SPECIFIC ANTIGEN, ELEVATED 10/08/2009   Renal calculi    Shortness of breath dyspnea    with exertion     Past Surgical History:  Procedure Laterality Date   CARDIOVASCULAR STRESS TEST  2006   negative   ERCP N/A 12/02/2020   Procedure: ENDOSCOPIC RETROGRADE CHOLANGIOPANCREATOGRAPHY (ERCP);  Surgeon: Lucilla Lame, MD;  Location: [facility] ENDOSCOPY;  Service: Endoscopy;  Laterality: N/A;   HAND SURGERY  1949   left hand laceration   HERNIA REPAIR  1990's   right and left inguinal hernia   KNEE SURGERY  1990's   TONSILLECTOMY  1945    Social History   Socioeconomic History   Marital status: Married    Spouse name:  Not on file   Number of children: 4   Years of education: Not on file   Highest education level: Not on file  Occupational History    Employer: RETIRED    Comment: Retired  Tobacco Use   Smoking status: Former    Packs/day: 2.00    Years: 30.00    Pack years: 60.00    Types: Cigarettes    Quit date: 1990    Years since quitting: 32.8   Smokeless tobacco: Never  Vaping Use   Vaping Use: Never used  Substance and Sexual Activity   Alcohol use: Yes    Alcohol/week: 5.0 standard drinks    Types: 5 Glasses of wine per week   Drug use: No   Sexual activity: Never  Other Topics Concern   Not on file  Social History Narrative   Regular exercise-yes, walks , golf   Occupation: Retired Hydrographic surveyor   Diet: (+) fruit, (+) veggies, (+) salad, (+) H20   Children: 4, healthy   Married x 43 years   Living will, [place], daughter. Full code. (reviewed 2013)   Social Determinants of Health   Financial Resource Strain: Not on file  Food Insecurity: Not on file  Transportation Needs: Not on file  Physical Activity: Not on file  Stress: Not on file  Social Connections: Not on file  Intimate Partner  Violence: Not on file     Family History  Problem Relation Age of Onset   Hypertension Mother    COPD Brother    Lung cancer Brother 62   Throat cancer Brother    Hypertension Father    Stroke Father    Cancer Maternal Grandmother        unknown   Thyroid disease Neg Hx      Current Outpatient Medications:    allopurinol (ZYLOPRIM) 300 MG tablet, TAKE 1 TABLET BY MOUTH  DAILY, Disp: 90 tablet, Rfl: 3   ANORO ELLIPTA 62.5-25 MCG/INH AEPB, INHALE 1 INHALATION BY  MOUTH INTO THE LUNGS IN THE MORNING, Disp: 180 each, Rfl: 0   fexofenadine (ALLEGRA) 180 MG tablet, Take 180 mg by mouth daily., Disp: , Rfl:    Halobetasol Propionate (ULTRAVATE) 0.05 % LOTN, APPLY ON THE SKIN DAILY. AVOID FACE, GROIN, AND UNDERARMS, Disp: 60 mL, Rfl: 0   hydrochlorothiazide  (HYDRODIURIL) 25 MG tablet, TAKE 1 TABLET BY MOUTH  DAILY, Disp: 90 tablet, Rfl: 3   isosorbide mononitrate (IMDUR) 30 MG 24 hr tablet, Take 0.5 tablets (15 mg total) by mouth daily., Disp: 45 tablet, Rfl: 3   levothyroxine (SYNTHROID) 137 MCG tablet, TAKE 1 TABLET BY MOUTH  DAILY BEFORE BREAKFAST, Disp: 90 tablet, Rfl: 3   metoprolol succinate (TOPROL XL) 25 MG 24 hr tablet, Take 0.5 tablets (12.5 mg total) by mouth daily., Disp: 45 tablet, Rfl: 3   omeprazole (PRILOSEC OTC) 20 MG tablet, Take 20 mg by mouth daily., Disp: , Rfl:    polyethylene glycol (MIRALAX / GLYCOLAX) packet, Take 17 g by mouth daily as needed., Disp: , Rfl:    terazosin (HYTRIN) 5 MG capsule, TAKE 1 CAPSULE BY MOUTH  DAILY, Disp: 90 capsule, Rfl: 3   aspirin EC 81 MG tablet, Take 1 tablet (81 mg total) by mouth daily. Swallow whole. (Patient not taking: Reported on 12/12/2020), Disp: 90 tablet, Rfl: 3   hydrOXYzine (ATARAX/VISTARIL) 10 MG tablet, Take 1 tablet (10 mg total) by mouth 3 (three) times daily as needed for itching., Disp: 30 tablet, Rfl: 0   Physical exam:  Vitals:   12/12/20 1508  Pulse: (!) 48  Resp: 18  Temp: 97.7 F (36.5 C)  TempSrc: Tympanic  SpO2: 99%  Weight: 250 lb (113.4 kg)   Physical Exam Constitutional:      General: He is not in acute distress. Eyes:     Comments: Mild scleral icterus.  Cardiovascular:     Rate and Rhythm: Normal rate and regular rhythm.     Heart sounds: Normal heart sounds.  Pulmonary:     Effort: Pulmonary effort is normal.     Breath sounds: Normal breath sounds.  Abdominal:     General: Bowel sounds are normal.     Palpations: Abdomen is soft.  Skin:    General: Skin is warm and dry.  Neurological:     Mental Status: He is alert and oriented to person, place, and time.       CMP Latest Ref Rng & Units 12/12/2020  Glucose 70 - 99 mg/dL 94  BUN 8 - 23 mg/dL 18  Creatinine 0.61 - 1.24 mg/dL 0.93  Sodium 135 - 145 mmol/L 142  Potassium 3.5 - 5.1 mmol/L  3.8  Chloride 98 - 111 mmol/L 107  CO2 22 - 32 mmol/L 27  Calcium 8.9 - 10.3 mg/dL 9.2  Total Protein 6.5 - 8.1 g/dL 6.8  Total Bilirubin 0.3 - 1.2 mg/dL  2.7(H)  Alkaline Phos 38 - 126 U/L 99  AST 15 - 41 U/L 43(H)  ALT 0 - 44 U/L 37   CBC Latest Ref Rng & Units 12/12/2020  WBC 4.0 - 10.5 K/uL 4.1  Hemoglobin 13.0 - 17.0 g/dL 12.6(L)  Hematocrit 39.0 - 52.0 % 39.4  Platelets 150 - 400 K/uL 199    No images are attached to the encounter.  DG C-Arm 1-60 Min-No Report  Result Date: 12/02/2020 Fluoroscopy was utilized by the requesting physician.  No radiographic interpretation.   MR ABDOMEN MRCP W WO CONTAST  Result Date: 11/29/2020 CLINICAL DATA:  Mild abdominal pain and nausea intermittently for 5 weeks. Abnormal liver function tests. EXAM: MRI ABDOMEN WITHOUT AND WITH CONTRAST (INCLUDING MRCP) TECHNIQUE: Multiplanar multisequence MR imaging of the abdomen was performed both before and after the administration of intravenous contrast. Heavily T2-weighted images of the biliary and pancreatic ducts were obtained, and three-dimensional MRCP images were rendered by post processing. CONTRAST:  52mL GADAVIST GADOBUTROL 1 MMOL/ML IV SOLN COMPARISON:  10/23/2020 abdominal sonogram. 05/07/2014 CT abdomen/pelvis. FINDINGS: Lower chest: No acute abnormality at the lung bases. Hepatobiliary: Normal liver size and configuration. No hepatic steatosis. Posterior right liver 1.6 cm hemangioma (series 4/image 13), stable since 05/07/2014 CT abdomen study. A few scattered subcentimeter simple liver cysts. No suspicious liver masses. Small amount of layering sludge within the nondistended gallbladder, with no definite gallstones in the gallbladder. No definite gallbladder wall thickening. No pericholecystic fluid. Mild-to-moderate diffuse intrahepatic biliary ductal dilatation. Prominent extrahepatic biliary ductal dilatation with proximal CBD diameter 20 mm. There is a poorly marginated enhancing solid 3.6 x  2.2 x 1.8 cm mass centered within and nearly occluding the mid common bile duct (series 12/image 8 and series 15/image 27). In addition, there are numerous stones throughout the entire length of the common bile duct, largest 6 mm proximally (series 12/image 7), with the CBD stones located above and below the level of the enhancing mid CBD mass. Pancreas: At least 3 scattered tiny unilocular cystic pancreatic lesions, largest 0.5 cm in the pancreatic body (series 12/image 10), without wall thickening or enhancement. No pancreatic duct dilation. No pancreas divisum. Spleen: Normal size. No mass. Adrenals/Urinary Tract: No discrete adrenal nodules. No hydronephrosis. Several scattered simple bilateral renal cysts. No suspicious renal masses. Stomach/Bowel: Normal non-distended stomach. Visualized small and large bowel is normal caliber, with no bowel wall thickening. Marked diffuse colonic diverticulosis. Vascular/Lymphatic: Normal caliber abdominal aorta. Patent portal, splenic, hepatic and renal veins. Mildly enlarged 1.1 cm porta hepatis node (series 22/image 40). No additional pathologically enlarged abdominal lymph nodes. Other: No abdominal ascites or focal fluid collection. Musculoskeletal: No aggressive appearing focal osseous lesions. IMPRESSION: 1. Poorly marginated enhancing solid 3.6 x 2.2 x 1.8 cm mass centered within and nearly occluding the mid common bile duct, highly suspicious for extrahepatic cholangiocarcinoma. Recommend GI consultation for ERCP with brush biopsy. 2. In addition, there are numerous subcentimeter stones throughout the entire length of the common bile duct, which measures up to 20 mm proximally diameter. Mild-to-moderate diffuse intrahepatic biliary ductal dilatation. 3. Nonspecific mild porta hepatis lymphadenopathy, metastatic disease not excluded. No suspicious liver masses. 4. Marked diffuse colonic diverticulosis. 5. At least 3 scattered tiny unilocular cystic pancreatic lesions,  largest 0.5 cm in the pancreatic body, without high risk features. No pancreatic duct dilation. Recommend attention on follow-up MRI abdomen without and with IV contrast in 2 years. This recommendation follows ACR consensus guidelines: Management of Incidental Pancreatic Cysts: A White Paper  of the ACR Incidental Findings Committee. [place] [id number];[id number]. These results will be called to the ordering clinician or representative by the Radiologist Assistant, and communication documented in the PACS or [employer]. Electronically Signed   By: Ilona Sorrel M.D.   On: 11/29/2020 11:53    Assessment and plan- Patient is a 80 y.o. male with newly diagnosed extrahepatic cholangiocarcinoma here to discuss further management  I have reviewed MRI MRCP abdomen findings independently and discussed findings with patient and his wife as well as his daughter.  MRI shows a 3.6 x 2.2 x 1.8 cm mass nearly occluding the mid common bile duct concerning for extrahepatic cholangiocarcinoma.  Nonspecific mild porta hepatis lymphadenopathy metastatic disease not excluded.  At this time I would like to get a PET CT scan to rule out any metastatic disease involvement.  I will also obtain baseline tumor markers today including CEA, CA 19-9 and AFP.  Brushings from bile duct showed malignant cells favoring adenocarcinoma but insufficient tissue for ancillary testing.  Therefore immunohistochemical stains as well as NGS testing cannot be obtained on the specimen.  If PET scan does not show any evidence of distant metastatic disease I would like to refer the patient to [facility] pancreaticobiliary surgery Dr. Hyman Hopes to see if we can proceed with definitive resection of the tumor which will give [place] enough tumor sample for NGS testing in the future.  Usually if tumors are surgically resectable plan is upfront surgery and adjuvant chemotherapy.  Neoadjuvant chemotherapy or chemoradiation could be considered on a case-by-case  basis for locally advanced unresectable disease but I would like to get surgical opinion after a PET scan  I will see him back after PET CT scan results are back.  Patient did have evidence of obstructive jaundice prior to his ERCP which has clearly improved and his bilirubin has come down from 8.5-2.7 but has not normalized yet.  I will plan to repeat his LFTs again when he comes to see me after the PET scan   Thank you for this kind referral and the opportunity to participate in the care of this  Patient   Visit Diagnosis 1. Aortic atherosclerosis ([place])   2. Coronary artery disease involving native coronary artery of native heart without angina pectoris   3. PAC (premature atrial contraction)   4. Common bile duct mass   5. Elevated liver enzymes   6. 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 Other specified carcinomas of liver ([place])      Dr. Randa Evens, MD, MPH [facility] [phone] 12/24/2020

## 2020-12-25 ENCOUNTER — Encounter (HOSPITAL_COMMUNITY)
Admission: RE | Admit: 2020-12-25 | Discharge: 2020-12-25 | Disposition: A | Discharge status: Home / Self Care | Payer: Medicare Other | Source: Ambulatory Visit | Attending: Oncology | Admitting: Oncology

## 2020-12-25 ENCOUNTER — Telehealth: Payer: Self-pay

## 2020-12-25 DIAGNOSIS — I700 Atherosclerosis of aorta: Secondary | ICD-10-CM | POA: Diagnosis not present

## 2020-12-25 DIAGNOSIS — M47816 Spondylosis without myelopathy or radiculopathy, lumbar region: Secondary | ICD-10-CM | POA: Insufficient documentation

## 2020-12-25 DIAGNOSIS — C227 Other specified carcinomas of liver: Secondary | ICD-10-CM | POA: Diagnosis not present

## 2020-12-25 DIAGNOSIS — I517 Cardiomegaly: Secondary | ICD-10-CM | POA: Diagnosis not present

## 2020-12-25 DIAGNOSIS — I2510 Atherosclerotic heart disease of native coronary artery without angina pectoris: Secondary | ICD-10-CM | POA: Insufficient documentation

## 2020-12-25 DIAGNOSIS — N433 Hydrocele, unspecified: Secondary | ICD-10-CM | POA: Insufficient documentation

## 2020-12-25 DIAGNOSIS — R748 Abnormal levels of other serum enzymes: Secondary | ICD-10-CM | POA: Diagnosis not present

## 2020-12-25 DIAGNOSIS — C221 Intrahepatic bile duct carcinoma: Secondary | ICD-10-CM | POA: Diagnosis not present

## 2020-12-25 LAB — GLUCOSE, CAPILLARY: Glucose-Capillary: 97 mg/dL (ref 70–99)

## 2020-12-25 MED ORDER — FLUDEOXYGLUCOSE F - 18 (FDG) INJECTION
12.5000 | Freq: Once | INTRAVENOUS | Status: AC
  Administered 2020-12-25: 12.4 via INTRAVENOUS

## 2020-12-25 NOTE — Telephone Encounter (Signed)
Contacted radiology and [place], MRI, and PET have been push to [facility]. Referral sent to Dr. Hyman Hopes.

## 2020-12-26 ENCOUNTER — Ambulatory Visit: Payer: Medicare Other | Admitting: Oncology

## 2020-12-30 ENCOUNTER — Other Ambulatory Visit: Payer: Self-pay

## 2020-12-30 ENCOUNTER — Inpatient Hospital Stay: Payer: Medicare Other

## 2020-12-30 ENCOUNTER — Inpatient Hospital Stay: Payer: Medicare Other | Attending: Oncology | Admitting: Oncology

## 2020-12-30 ENCOUNTER — Encounter: Payer: Self-pay | Admitting: Oncology

## 2020-12-30 VITALS — BP 132/64 | HR 53 | Temp 96.8°F | Resp 18 | Wt 253.0 lb

## 2020-12-30 DIAGNOSIS — C240 Malignant neoplasm of extrahepatic bile duct: Secondary | ICD-10-CM

## 2020-12-30 DIAGNOSIS — I700 Atherosclerosis of aorta: Secondary | ICD-10-CM

## 2020-12-30 DIAGNOSIS — Z7189 Other specified counseling: Secondary | ICD-10-CM | POA: Diagnosis not present

## 2020-12-30 DIAGNOSIS — C221 Intrahepatic bile duct carcinoma: Secondary | ICD-10-CM | POA: Diagnosis not present

## 2020-12-30 LAB — COMPREHENSIVE METABOLIC PANEL
ALT: 20 U/L (ref 0–44)
AST: 28 U/L (ref 15–41)
Albumin: 3.6 g/dL (ref 3.5–5.0)
Alkaline Phosphatase: 71 U/L (ref 38–126)
Anion gap: 6 (ref 5–15)
BUN: 18 mg/dL (ref 8–23)
CO2: 28 mmol/L (ref 22–32)
Calcium: 8.9 mg/dL (ref 8.9–10.3)
Chloride: 104 mmol/L (ref 98–111)
Creatinine, Ser: 1.08 mg/dL (ref 0.61–1.24)
GFR, Estimated: >60 mL/min (ref >60)
Glucose, Bld: 96 mg/dL (ref 70–99)
Potassium: 3.6 mmol/L (ref 3.5–5.1)
Sodium: 138 mmol/L (ref 135–145)
Total Bilirubin: 1.7 mg/dL — ABNORMAL HIGH (ref 0.3–1.2)
Total Protein: 6.7 g/dL (ref 6.5–8.1)

## 2020-12-30 NOTE — Progress Notes (Signed)
Hematology/Oncology Consult note [facility]  [phone] [phone]  Patient Care Team: Jinny Sanders, MD as PCP - General End, Harrell Gave, MD as PCP - Cardiology (Cardiology) Vickie Epley, MD as PCP - Electrophysiology (Cardiology) Birder Robson, MD as Referring Physician (Ophthalmology) Renato Shin, MD as Consulting Physician (Endocrinology) Nickie Retort, MD (Inactive) as Consulting Physician (Urology) Clent Jacks, RN as Oncology Nurse Navigator   Name of the patient: Jason Solomon  [id number]  12/03/1940   Date of visit: 12/30/20  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  He underwent right upper quadrant ultrasound which was done as a part of work-up for elevated liver enzymes.  Ultrasound showed marked intra and extrahepatic biliary ductal dilatation.  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  Numerous subcentimeter stones throughout the entire length of CBD.  Unilocular cystic pancreatic lesions.  Patient was seen by Dr. Allen Norris and underwent ERCP on 12/02/2020 which showed a single segmental biliary stricture in the middle third of the main bile duct which was malignant appearing.  Entire main bile duct dilated.  Biliary sphincterotomy was performed and brushings obtained from the middle third of the main bile duct.Cytology showed malignant cells favoring adenocarcinoma.  Insufficient tissue for ancillary testing.  CMP showed elevated total bilirubin of 8.5 with alkaline phosphatase  of 257 and AST and ALT of 73 and 56 respectively prior to ERCP.   Patient is doing well at his age and plays golf regularly.  Appetite and weight have been stable.  Denies any abdominal pain.  He is independent of his ADLs and IADLs.  Interval history-patient is here with his wife today.  His daughter and son also heard my conversation over the phone today.  Overall patient is doing well and denies any significant abdominal pain.  Bowel movements are regular  ECOG PS- 1 Pain scale- 0   Review of systems- Review of Systems  Constitutional:  Negative for chills, fever, malaise/fatigue and weight loss.  HENT:  Negative for congestion, ear discharge and nosebleeds.   Eyes:  Negative for blurred vision.  Respiratory:  Negative for cough, hemoptysis, sputum production, shortness of breath and wheezing.   Cardiovascular:  Negative for chest pain, palpitations, orthopnea and claudication.  Gastrointestinal:  Negative for abdominal pain, blood in stool, constipation, diarrhea, heartburn, melena, nausea and vomiting.  Genitourinary:  Negative for dysuria, flank pain, frequency, hematuria and urgency.  Musculoskeletal:  Negative for back pain, joint pain and myalgias.  Skin:  Negative for rash.  Neurological:  Negative for dizziness, tingling, focal weakness, seizures, weakness and headaches.  Endo/Heme/Allergies:  Does not bruise/bleed easily.  Psychiatric/Behavioral:  Negative for depression and suicidal ideas. The patient does not have insomnia.      Allergies  Allergen Reactions   Ace Inhibitors     REACTION: ?angioedema   Angiotensin Receptor Blockers     REACTION: angioedema on ACE?   Quinolones     Aortic dilation     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   COPD (chronic obstructive pulmonary  disease) ([place])    DIVERTICULOSIS, COLON 01/12/2007   EUSTACHIAN TUBE DYSFUNCTION, BILATERAL 12/28/2006   GERD (gastroesophageal reflux disease)     GOITER, MULTINODULAR 05/04/2010   GOUT 01/12/2007   Heart murmur    childhood only   HYPERCHOLESTEROLEMIA 06/20/2007   Hyperlipidemia    HYPERTENSION 01/12/2007   HYPERTHYROIDISM 04/24/2010   OBESITY 01/12/2007   PROSTATE SPECIFIC ANTIGEN, ELEVATED 10/08/2009   Renal calculi    Shortness of breath dyspnea    with exertion     Past Surgical History:  Procedure Laterality Date   CARDIOVASCULAR STRESS TEST  2006   negative   ERCP N/A 12/02/2020   Procedure: ENDOSCOPIC RETROGRADE CHOLANGIOPANCREATOGRAPHY (ERCP);  Surgeon: Lucilla Lame, MD;  Location: [facility] ENDOSCOPY;  Service: Endoscopy;  Laterality: N/A;   HAND SURGERY  1949   left hand laceration   HERNIA REPAIR  1990's   right and left inguinal hernia   KNEE SURGERY  1990's   TONSILLECTOMY  1945    Social History   Socioeconomic History   Marital status: Married    Spouse name: Not on file   Number of children: 4   Years of education: Not on file   Highest education level: Not on file  Occupational History    Employer: RETIRED    Comment: Retired  Tobacco Use   Smoking status: Former    Packs/day: 2.00    Years: 30.00    Pack years: 60.00    Types: Cigarettes    Quit date: 1990    Years since quitting: 32.8   Smokeless tobacco: Never  Vaping Use   Vaping Use: Never used  Substance and Sexual Activity   Alcohol use: Yes    Alcohol/week: 5.0 standard drinks    Types: 5 Glasses of wine per week   Drug use: No   Sexual activity: Never  Other Topics Concern   Not on file  Social History Narrative   Regular exercise-yes, walks , golf   Occupation: Retired Hydrographic surveyor   Diet: (+) fruit, (+) veggies, (+) salad, (+) H20   Children: 4, healthy   Married x 43 years   Living will, [place], daughter. Full code. (reviewed 2013)   Social Determinants of Health   Financial Resource Strain: Not on file  Food Insecurity: Not on file  Transportation Needs: Not on file  Physical  Activity: Not on file  Stress: Not on file  Social Connections: Not on file  Intimate Partner Violence: Not on file    Family History  Problem Relation Age of Onset   Hypertension Mother    COPD Brother    Lung cancer Brother 29   Throat cancer Brother    Hypertension Father    Stroke Father    Cancer Maternal Grandmother        unknown   Thyroid disease Neg Hx      Current Outpatient Medications:    allopurinol (ZYLOPRIM) 300 MG tablet, TAKE 1 TABLET BY MOUTH  DAILY, Disp: 90 tablet, Rfl: 3   ANORO ELLIPTA 62.5-25 MCG/INH AEPB, INHALE 1 INHALATION BY  MOUTH INTO THE LUNGS IN THE MORNING, Disp: 180 each, Rfl: 0   fexofenadine (ALLEGRA) 180 MG tablet, Take 180 mg by mouth daily., Disp: , Rfl:    Halobetasol Propionate (ULTRAVATE) 0.05 % LOTN, APPLY ON THE SKIN DAILY. AVOID FACE, GROIN, AND UNDERARMS, Disp: 60 mL, Rfl: 0   hydrochlorothiazide (HYDRODIURIL) 25 MG tablet, TAKE 1 TABLET BY MOUTH  DAILY, Disp:  90 tablet, Rfl: 3   isosorbide mononitrate (IMDUR) 30 MG 24 hr tablet, Take 0.5 tablets (15 mg total) by mouth daily., Disp: 45 tablet, Rfl: 3   levothyroxine (SYNTHROID) 137 MCG tablet, TAKE 1 TABLET BY MOUTH  DAILY BEFORE BREAKFAST, Disp: 90 tablet, Rfl: 3   metoprolol succinate (TOPROL XL) 25 MG 24 hr tablet, Take 0.5 tablets (12.5 mg total) by mouth daily., Disp: 45 tablet, Rfl: 3   omeprazole (PRILOSEC OTC) 20 MG tablet, Take 20 mg by mouth daily., Disp: , Rfl:    polyethylene glycol (MIRALAX / GLYCOLAX) packet, Take 17 g by mouth daily as needed., Disp: , Rfl:    terazosin (HYTRIN) 5 MG capsule, TAKE 1 CAPSULE BY MOUTH  DAILY, Disp: 90 capsule, Rfl: 3  Physical exam:  Vitals:   12/30/20 1114  BP: 132/64  Pulse: (!) 53  Resp: 18  Temp: (!) 96.8 F (36 C)  SpO2: 98%  Weight: 253 lb (114.8 kg)   Physical Exam Cardiovascular:     Rate and Rhythm: Normal rate and regular rhythm.  Pulmonary:     Effort: Pulmonary effort is normal.  Skin:    General: Skin is warm and  dry.  Neurological:     Mental Status: He is alert and oriented to person, place, and time.     CMP Latest Ref Rng & Units 12/12/2020  Glucose 70 - 99 mg/dL 94  BUN 8 - 23 mg/dL 18  Creatinine 0.61 - 1.24 mg/dL 0.93  Sodium 135 - 145 mmol/L 142  Potassium 3.5 - 5.1 mmol/L 3.8  Chloride 98 - 111 mmol/L 107  CO2 22 - 32 mmol/L 27  Calcium 8.9 - 10.3 mg/dL 9.2  Total Protein 6.5 - 8.1 g/dL 6.8  Total Bilirubin 0.3 - 1.2 mg/dL 2.7(H)  Alkaline Phos 38 - 126 U/L 99  AST 15 - 41 U/L 43(H)  ALT 0 - 44 U/L 37   CBC Latest Ref Rng & Units 12/12/2020  WBC 4.0 - 10.5 K/uL 4.1  Hemoglobin 13.0 - 17.0 g/dL 12.6(L)  Hematocrit 39.0 - 52.0 % 39.4  Platelets 150 - 400 K/uL 199    No images are attached to the encounter.  NM PET Image Initial (PI) Skull Base To Thigh  Addendum Date: 12/25/2020   ADDENDUM REPORT: 12/25/2020 15:15 ADDENDUM: The original report was by Dr. Van Clines. The following addendum is by Dr. Van Clines: THERE IS ONE ADDITIONAL IMPRESSION: 6. About the right mid femur level in the peripheral soft tissues of the lateral portion of the vastus intermedius, an oval-shaped 2.1 by 1.4 cm lesion has faintly accentuated metabolic activity with maximum SUV 4.6. 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 Consider further characterization with dedicated MRI of the femur with and without contrast with attention to the mid femur region. This would be an unusual location for a bile duct metastatic lesion. Electronically Signed   By: Van Clines M.D.   On: 12/25/2020 15:15   Result Date: 12/25/2020 CLINICAL DATA:  Initial treatment strategy for bile duct adenocarcinoma. EXAM: NUCLEAR MEDICINE PET SKULL BASE TO THIGH TECHNIQUE: 12.4 mCi F-18 FDG was injected intravenously. Full-ring PET imaging was performed from the skull base to thigh after the radiotracer. CT data was obtained and used for  attenuation correction and anatomic localization. Fasting blood glucose: 97 mg/dl COMPARISON:  Multiple exams, including MRI abdomen 11/28/2020 FINDINGS: Mediastinal blood pool activity: SUV max 2.9 Liver  activity: SUV max N/A NECK: Mildly asymmetric right palatine tonsillar activity maximum SUV 6.7 on the right and 4.7 on the left, without asymmetry on the CT data. Incidental CT findings: Left mastoid effusion. Mild chronic ethmoid sinusitis. Slightly narrowed appearance of the supraglottic airway, significance uncertain. CHEST: No significant abnormal hypermetabolic activity in this region. Incidental CT findings: Coronary, aortic arch, and branch vessel atherosclerotic vascular disease. Moderate cardiomegaly. Elevated left hemidiaphragm. Airway thickening in both lower lobes with some airway plugging and mild atelectasis. ABDOMEN/PELVIS: The abnormal soft tissue density in the vicinity of the common bile duct is traversed by a biliary stent and has a maximum SUV of 14.8, compatible malignancy. No discrete hypermetabolic lesions in the liver parenchyma are identified. A 1.1 cm portacaval lymph node on image 112 of series 4 has a maximum SUV of 3.6, only slightly above blood pool. On the bottom most images along the lateral margin of the right vastus intermedius muscle on image 238 series 4 we demonstrate a 2.2 by 1.4 cm hypodense lesion which has faintly accentuated metabolic activity, maximum SUV 4.6. The tiny pancreatic cystic lesions discussed at MRI are not appreciably hypermetabolic but are well below sensitive PET-CT size thresholds, with the largest only 0.5 cm in diameter. Incidental CT findings: Pneumobilia. Atherosclerosis is present, including aortoiliac atherosclerotic disease. Widespread colonic diverticulosis. Mild prostatomegaly. Small left scrotal hydrocele. SKELETON: No significant abnormal hypermetabolic activity in this region. Incidental CT findings: Mild bilateral sacroiliitis. Grade 1  degenerative retrolisthesis at L2-3 with a large Schmorl's node along the inferior endplate of L2. Lumbar spondylosis and degenerative disc disease. IMPRESSION: 1. Hypermetabolic tumor in the common bile duct traversed by a biliary stent. This tumor has maximum SUV of 14.8. 2. A 1.1 cm portacaval lymph node has a maximum SUV of only 3.6. This is slightly above blood pool and is not considered enlarged by size criteria for this location, but very early metastatic involvement cannot be totally excluded. 3. No definite hypermetabolic foci in the liver parenchyma. 4. Mildly asymmetric right greater than left palatine tonsillar activity, without significant asymmetry on the CT data. This is probably an incidental physiologic asymmetry, although rarely such findings can be from an occult malignancy. 5. Other imaging findings of potential clinical significance: Left mastoid effusion. Chronic ethmoid sinusitis. Mildly narrowed supraglottic airway uncertain significance. Aortic Atherosclerosis (ICD10-I70.0). Coronary atherosclerosis. Moderate cardiomegaly. Elevated left hemidiaphragm. Airway plugging with mild atelectasis in the lower lobes. Pneumobilia. Mild prostatomegaly. Small left scrotal hydrocele. Mild bilateral sacroiliitis. Lumbar spondylosis and degenerative disc disease. Electronically Signed: By: Van Clines M.D. On: 12/25/2020 12:44   DG C-Arm 1-60 Min-No Report  Result Date: 12/02/2020 Fluoroscopy was utilized by the requesting physician.  No radiographic interpretation.     Assessment and plan- Patient is a 80 y.o. male with newly diagnosed extrahepatic cholangiocarcinoma possibly stage III T2 N1 M0 here to discuss further management  I have reviewed PET/CT scan images independently and I have shown the images to patient and his wife as well.  PET CT scan showsAbnormal SUV uptake in the region of common bile duct with an SUV of 14.8.  There was an adjacent portacaval lymph node 1.1 cm with an  SUV of 3.6 only slightly above the blood pool and it is unclear if this lymph node is truly involved with malignancy or not.  If we resume that the lymph nodes involved and clinically this would be a stage III.  I have discussed this case with Dr. Hyman Hopes from pancreaticobiliary at [facility]  and he will be seeing him next Friday to see if this is potentially resectable.  I will hold off on any further biopsies at this time for NGS testing since ERCP brushings did confirm malignancy which in His clinical context would be compatible with an extrahepatic cholangiocarcinoma.  CA 19-9 is mildly elevated at 36 and CEA and AFP are currently normal.  If malignancy is considered potentially resectable then I would favor doing adjuvant chemotherapy with or without chemoradiation based on margin status and nodal status.  I will discuss this in further detail after he sees Dr. Hyman Hopes.  Neoadjuvant chemotherapy extrahepatic cholangiocarcinoma is not typically the standard of care and would be considered on a case-by-case basis.  Patient's bilirubin after ERCP did come down to 2.7 as compared to 8.5 before.I will be repeating his CMP today. Also discussed other incidental findings on the PET CT scan including left mastoid effusion andAsymmetric uptake in the right palatine tonsil as well as features of chronic ethmoid sinusitis.  We can consider ENT referral for this down the line.  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  I am holding off on getting an MRI for this.  Follow-up with me to be decided based on surgical opinion at [facility] .    Visit Diagnosis 1. Primary cholangiocarcinoma of extrahepatic bile duct ([place])   2. Goals of care, counseling/discussion      Dr. Randa Evens, MD, MPH [facility] [phone] 12/30/2020 12:15 PM

## 2021-01-08 ENCOUNTER — Ambulatory Visit: Payer: Medicare Other | Admitting: Gastroenterology

## 2021-01-09 DIAGNOSIS — C240 Malignant neoplasm of extrahepatic bile duct: Secondary | ICD-10-CM | POA: Diagnosis not present

## 2021-01-09 DIAGNOSIS — Z8505 Personal history of malignant neoplasm of liver: Secondary | ICD-10-CM | POA: Diagnosis not present

## 2021-01-09 DIAGNOSIS — K831 Obstruction of bile duct: Secondary | ICD-10-CM | POA: Diagnosis not present

## 2021-01-09 DIAGNOSIS — R2241 Localized swelling, mass and lump, right lower limb: Secondary | ICD-10-CM | POA: Diagnosis not present

## 2021-01-09 DIAGNOSIS — Z08 Encounter for follow-up examination after completed treatment for malignant neoplasm: Secondary | ICD-10-CM | POA: Diagnosis not present

## 2021-01-09 DIAGNOSIS — Z87891 Personal history of nicotine dependence: Secondary | ICD-10-CM | POA: Diagnosis not present

## 2021-01-09 DIAGNOSIS — Z79899 Other long term (current) drug therapy: Secondary | ICD-10-CM | POA: Diagnosis not present

## 2021-01-09 DIAGNOSIS — R7989 Other specified abnormal findings of blood chemistry: Secondary | ICD-10-CM | POA: Diagnosis not present

## 2021-01-09 DIAGNOSIS — Z9221 Personal history of antineoplastic chemotherapy: Secondary | ICD-10-CM | POA: Diagnosis not present

## 2021-01-11 ENCOUNTER — Encounter: Payer: Self-pay | Admitting: Gastroenterology

## 2021-01-11 ENCOUNTER — Encounter: Payer: Self-pay | Admitting: Family Medicine

## 2021-01-12 ENCOUNTER — Encounter: Payer: Self-pay | Admitting: Family Medicine

## 2021-01-12 ENCOUNTER — Other Ambulatory Visit: Payer: Self-pay

## 2021-01-12 DIAGNOSIS — C240 Malignant neoplasm of extrahepatic bile duct: Secondary | ICD-10-CM

## 2021-01-12 DIAGNOSIS — I700 Atherosclerosis of aorta: Secondary | ICD-10-CM

## 2021-01-12 NOTE — Telephone Encounter (Signed)
Appointments are still scheduled.

## 2021-01-14 DIAGNOSIS — R2241 Localized swelling, mass and lump, right lower limb: Secondary | ICD-10-CM | POA: Diagnosis not present

## 2021-01-14 DIAGNOSIS — M609 Myositis, unspecified: Secondary | ICD-10-CM | POA: Diagnosis not present

## 2021-01-14 DIAGNOSIS — M899 Disorder of bone, unspecified: Secondary | ICD-10-CM | POA: Diagnosis not present

## 2021-01-20 DIAGNOSIS — R000 Tachycardia, unspecified: Secondary | ICD-10-CM | POA: Diagnosis not present

## 2021-01-20 DIAGNOSIS — J449 Chronic obstructive pulmonary disease, unspecified: Secondary | ICD-10-CM | POA: Diagnosis present

## 2021-01-20 DIAGNOSIS — J9811 Atelectasis: Secondary | ICD-10-CM | POA: Diagnosis not present

## 2021-01-20 DIAGNOSIS — I110 Hypertensive heart disease with heart failure: Secondary | ICD-10-CM | POA: Diagnosis present

## 2021-01-20 DIAGNOSIS — I2609 Other pulmonary embolism with acute cor pulmonale: Secondary | ICD-10-CM | POA: Diagnosis not present

## 2021-01-20 DIAGNOSIS — K219 Gastro-esophageal reflux disease without esophagitis: Secondary | ICD-10-CM | POA: Diagnosis present

## 2021-01-20 DIAGNOSIS — I519 Heart disease, unspecified: Secondary | ICD-10-CM | POA: Diagnosis not present

## 2021-01-20 DIAGNOSIS — Z4682 Encounter for fitting and adjustment of non-vascular catheter: Secondary | ICD-10-CM | POA: Diagnosis not present

## 2021-01-20 DIAGNOSIS — R109 Unspecified abdominal pain: Secondary | ICD-10-CM | POA: Diagnosis not present

## 2021-01-20 DIAGNOSIS — C221 Intrahepatic bile duct carcinoma: Secondary | ICD-10-CM | POA: Diagnosis not present

## 2021-01-20 DIAGNOSIS — I2510 Atherosclerotic heart disease of native coronary artery without angina pectoris: Secondary | ICD-10-CM | POA: Diagnosis present

## 2021-01-20 DIAGNOSIS — I5189 Other ill-defined heart diseases: Secondary | ICD-10-CM | POA: Diagnosis not present

## 2021-01-20 DIAGNOSIS — I480 Paroxysmal atrial fibrillation: Secondary | ICD-10-CM | POA: Diagnosis not present

## 2021-01-20 DIAGNOSIS — K6389 Other specified diseases of intestine: Secondary | ICD-10-CM | POA: Diagnosis not present

## 2021-01-20 DIAGNOSIS — Z01818 Encounter for other preprocedural examination: Secondary | ICD-10-CM | POA: Diagnosis not present

## 2021-01-20 DIAGNOSIS — N179 Acute kidney failure, unspecified: Secondary | ICD-10-CM | POA: Diagnosis not present

## 2021-01-20 DIAGNOSIS — Z452 Encounter for adjustment and management of vascular access device: Secondary | ICD-10-CM | POA: Diagnosis not present

## 2021-01-20 DIAGNOSIS — R918 Other nonspecific abnormal finding of lung field: Secondary | ICD-10-CM | POA: Diagnosis not present

## 2021-01-20 DIAGNOSIS — K9189 Other postprocedural complications and disorders of digestive system: Secondary | ICD-10-CM | POA: Diagnosis not present

## 2021-01-20 DIAGNOSIS — I471 Supraventricular tachycardia: Secondary | ICD-10-CM | POA: Diagnosis present

## 2021-01-20 DIAGNOSIS — Z66 Do not resuscitate: Secondary | ICD-10-CM | POA: Diagnosis not present

## 2021-01-20 DIAGNOSIS — Z4659 Encounter for fitting and adjustment of other gastrointestinal appliance and device: Secondary | ICD-10-CM | POA: Diagnosis not present

## 2021-01-20 DIAGNOSIS — K838 Other specified diseases of biliary tract: Secondary | ICD-10-CM | POA: Diagnosis not present

## 2021-01-20 DIAGNOSIS — Z8674 Personal history of sudden cardiac arrest: Secondary | ICD-10-CM | POA: Diagnosis not present

## 2021-01-20 DIAGNOSIS — Z515 Encounter for palliative care: Secondary | ICD-10-CM | POA: Diagnosis not present

## 2021-01-20 DIAGNOSIS — I5082 Biventricular heart failure: Secondary | ICD-10-CM | POA: Diagnosis present

## 2021-01-20 DIAGNOSIS — I50810 Right heart failure, unspecified: Secondary | ICD-10-CM | POA: Diagnosis not present

## 2021-01-20 DIAGNOSIS — I2699 Other pulmonary embolism without acute cor pulmonale: Secondary | ICD-10-CM | POA: Diagnosis not present

## 2021-01-20 DIAGNOSIS — I10 Essential (primary) hypertension: Secondary | ICD-10-CM | POA: Diagnosis not present

## 2021-01-20 DIAGNOSIS — R579 Shock, unspecified: Secondary | ICD-10-CM | POA: Diagnosis not present

## 2021-01-20 DIAGNOSIS — J9601 Acute respiratory failure with hypoxia: Secondary | ICD-10-CM | POA: Diagnosis not present

## 2021-01-20 DIAGNOSIS — R578 Other shock: Secondary | ICD-10-CM | POA: Diagnosis not present

## 2021-01-20 DIAGNOSIS — G8918 Other acute postprocedural pain: Secondary | ICD-10-CM | POA: Diagnosis not present

## 2021-01-20 DIAGNOSIS — I4891 Unspecified atrial fibrillation: Secondary | ICD-10-CM | POA: Diagnosis not present

## 2021-01-20 DIAGNOSIS — I5022 Chronic systolic (congestive) heart failure: Secondary | ICD-10-CM | POA: Diagnosis present

## 2021-01-20 DIAGNOSIS — J939 Pneumothorax, unspecified: Secondary | ICD-10-CM | POA: Diagnosis not present

## 2021-01-20 DIAGNOSIS — I469 Cardiac arrest, cause unspecified: Secondary | ICD-10-CM | POA: Diagnosis not present

## 2021-01-20 DIAGNOSIS — I468 Cardiac arrest due to other underlying condition: Secondary | ICD-10-CM | POA: Diagnosis not present

## 2021-01-20 DIAGNOSIS — C240 Malignant neoplasm of extrahepatic bile duct: Secondary | ICD-10-CM | POA: Diagnosis present

## 2021-01-20 DIAGNOSIS — G4733 Obstructive sleep apnea (adult) (pediatric): Secondary | ICD-10-CM | POA: Diagnosis present

## 2021-01-20 DIAGNOSIS — I071 Rheumatic tricuspid insufficiency: Secondary | ICD-10-CM | POA: Diagnosis present

## 2021-01-20 DIAGNOSIS — K811 Chronic cholecystitis: Secondary | ICD-10-CM | POA: Diagnosis not present

## 2021-01-20 DIAGNOSIS — Z20822 Contact with and (suspected) exposure to covid-19: Secondary | ICD-10-CM | POA: Diagnosis present

## 2021-01-20 DIAGNOSIS — J90 Pleural effusion, not elsewhere classified: Secondary | ICD-10-CM | POA: Diagnosis not present

## 2021-01-20 DIAGNOSIS — E785 Hyperlipidemia, unspecified: Secondary | ICD-10-CM | POA: Diagnosis present

## 2021-01-20 DIAGNOSIS — D015 Carcinoma in situ of liver, gallbladder and bile ducts: Secondary | ICD-10-CM | POA: Diagnosis not present

## 2021-01-20 DIAGNOSIS — A419 Sepsis, unspecified organism: Secondary | ICD-10-CM | POA: Diagnosis not present

## 2021-01-20 DIAGNOSIS — E8721 Acute metabolic acidosis: Secondary | ICD-10-CM | POA: Diagnosis not present

## 2021-01-20 DIAGNOSIS — G839 Paralytic syndrome, unspecified: Secondary | ICD-10-CM | POA: Diagnosis not present

## 2021-01-20 DIAGNOSIS — E039 Hypothyroidism, unspecified: Secondary | ICD-10-CM | POA: Diagnosis present

## 2021-01-20 DIAGNOSIS — I493 Ventricular premature depolarization: Secondary | ICD-10-CM | POA: Diagnosis not present

## 2021-01-20 DIAGNOSIS — E872 Acidosis, unspecified: Secondary | ICD-10-CM | POA: Diagnosis not present

## 2021-01-20 DIAGNOSIS — D481 Neoplasm of uncertain behavior of connective and other soft tissue: Secondary | ICD-10-CM | POA: Diagnosis not present

## 2021-01-20 DIAGNOSIS — Z992 Dependence on renal dialysis: Secondary | ICD-10-CM | POA: Diagnosis not present

## 2021-01-20 DIAGNOSIS — E162 Hypoglycemia, unspecified: Secondary | ICD-10-CM | POA: Diagnosis not present

## 2021-01-20 DIAGNOSIS — D62 Acute posthemorrhagic anemia: Secondary | ICD-10-CM | POA: Diagnosis not present

## 2021-01-22 DIAGNOSIS — D015 Carcinoma in situ of liver, gallbladder and bile ducts: Secondary | ICD-10-CM | POA: Diagnosis not present

## 2021-01-22 DIAGNOSIS — C240 Malignant neoplasm of extrahepatic bile duct: Secondary | ICD-10-CM | POA: Diagnosis present

## 2021-01-22 DIAGNOSIS — I2699 Other pulmonary embolism without acute cor pulmonale: Secondary | ICD-10-CM | POA: Diagnosis not present

## 2021-01-22 DIAGNOSIS — Z452 Encounter for adjustment and management of vascular access device: Secondary | ICD-10-CM | POA: Diagnosis not present

## 2021-01-22 DIAGNOSIS — K219 Gastro-esophageal reflux disease without esophagitis: Secondary | ICD-10-CM | POA: Diagnosis present

## 2021-01-22 DIAGNOSIS — I50810 Right heart failure, unspecified: Secondary | ICD-10-CM | POA: Diagnosis not present

## 2021-01-22 DIAGNOSIS — A419 Sepsis, unspecified organism: Secondary | ICD-10-CM | POA: Diagnosis not present

## 2021-01-22 DIAGNOSIS — J9601 Acute respiratory failure with hypoxia: Secondary | ICD-10-CM | POA: Diagnosis not present

## 2021-01-22 DIAGNOSIS — Z20822 Contact with and (suspected) exposure to covid-19: Secondary | ICD-10-CM | POA: Diagnosis present

## 2021-01-22 DIAGNOSIS — I480 Paroxysmal atrial fibrillation: Secondary | ICD-10-CM | POA: Diagnosis not present

## 2021-01-22 DIAGNOSIS — E039 Hypothyroidism, unspecified: Secondary | ICD-10-CM | POA: Diagnosis present

## 2021-01-22 DIAGNOSIS — G4733 Obstructive sleep apnea (adult) (pediatric): Secondary | ICD-10-CM | POA: Diagnosis present

## 2021-01-22 DIAGNOSIS — I4891 Unspecified atrial fibrillation: Secondary | ICD-10-CM | POA: Diagnosis not present

## 2021-01-22 DIAGNOSIS — R000 Tachycardia, unspecified: Secondary | ICD-10-CM | POA: Diagnosis not present

## 2021-01-22 DIAGNOSIS — N179 Acute kidney failure, unspecified: Secondary | ICD-10-CM | POA: Diagnosis not present

## 2021-01-22 DIAGNOSIS — R109 Unspecified abdominal pain: Secondary | ICD-10-CM | POA: Diagnosis not present

## 2021-01-22 DIAGNOSIS — J90 Pleural effusion, not elsewhere classified: Secondary | ICD-10-CM | POA: Diagnosis not present

## 2021-01-22 DIAGNOSIS — I2510 Atherosclerotic heart disease of native coronary artery without angina pectoris: Secondary | ICD-10-CM | POA: Diagnosis present

## 2021-01-22 DIAGNOSIS — Z4659 Encounter for fitting and adjustment of other gastrointestinal appliance and device: Secondary | ICD-10-CM | POA: Diagnosis not present

## 2021-01-22 DIAGNOSIS — I468 Cardiac arrest due to other underlying condition: Secondary | ICD-10-CM | POA: Diagnosis not present

## 2021-01-22 DIAGNOSIS — J9811 Atelectasis: Secondary | ICD-10-CM | POA: Diagnosis not present

## 2021-01-22 DIAGNOSIS — I2609 Other pulmonary embolism with acute cor pulmonale: Secondary | ICD-10-CM | POA: Diagnosis not present

## 2021-01-22 DIAGNOSIS — I471 Supraventricular tachycardia: Secondary | ICD-10-CM | POA: Diagnosis present

## 2021-01-22 DIAGNOSIS — Z4682 Encounter for fitting and adjustment of non-vascular catheter: Secondary | ICD-10-CM | POA: Diagnosis not present

## 2021-01-22 DIAGNOSIS — K9189 Other postprocedural complications and disorders of digestive system: Secondary | ICD-10-CM | POA: Diagnosis not present

## 2021-01-22 DIAGNOSIS — J939 Pneumothorax, unspecified: Secondary | ICD-10-CM | POA: Diagnosis not present

## 2021-01-22 DIAGNOSIS — C221 Intrahepatic bile duct carcinoma: Secondary | ICD-10-CM | POA: Diagnosis not present

## 2021-01-22 DIAGNOSIS — I110 Hypertensive heart disease with heart failure: Secondary | ICD-10-CM | POA: Diagnosis present

## 2021-01-22 DIAGNOSIS — I071 Rheumatic tricuspid insufficiency: Secondary | ICD-10-CM | POA: Diagnosis present

## 2021-01-22 DIAGNOSIS — E8721 Acute metabolic acidosis: Secondary | ICD-10-CM | POA: Diagnosis not present

## 2021-01-22 DIAGNOSIS — Z992 Dependence on renal dialysis: Secondary | ICD-10-CM | POA: Diagnosis not present

## 2021-01-22 DIAGNOSIS — E872 Acidosis, unspecified: Secondary | ICD-10-CM | POA: Diagnosis not present

## 2021-01-22 DIAGNOSIS — I469 Cardiac arrest, cause unspecified: Secondary | ICD-10-CM | POA: Diagnosis not present

## 2021-01-22 DIAGNOSIS — Z66 Do not resuscitate: Secondary | ICD-10-CM | POA: Diagnosis not present

## 2021-01-22 DIAGNOSIS — E162 Hypoglycemia, unspecified: Secondary | ICD-10-CM | POA: Diagnosis not present

## 2021-01-22 DIAGNOSIS — I5022 Chronic systolic (congestive) heart failure: Secondary | ICD-10-CM | POA: Diagnosis present

## 2021-01-22 DIAGNOSIS — D62 Acute posthemorrhagic anemia: Secondary | ICD-10-CM | POA: Diagnosis not present

## 2021-01-22 DIAGNOSIS — I5189 Other ill-defined heart diseases: Secondary | ICD-10-CM | POA: Diagnosis not present

## 2021-01-22 DIAGNOSIS — I519 Heart disease, unspecified: Secondary | ICD-10-CM | POA: Diagnosis not present

## 2021-01-22 DIAGNOSIS — Z8674 Personal history of sudden cardiac arrest: Secondary | ICD-10-CM | POA: Diagnosis not present

## 2021-01-22 DIAGNOSIS — I493 Ventricular premature depolarization: Secondary | ICD-10-CM | POA: Diagnosis not present

## 2021-01-22 DIAGNOSIS — G8918 Other acute postprocedural pain: Secondary | ICD-10-CM | POA: Diagnosis not present

## 2021-01-22 DIAGNOSIS — R579 Shock, unspecified: Secondary | ICD-10-CM | POA: Diagnosis not present

## 2021-01-22 DIAGNOSIS — I5082 Biventricular heart failure: Secondary | ICD-10-CM | POA: Diagnosis present

## 2021-01-22 DIAGNOSIS — Z515 Encounter for palliative care: Secondary | ICD-10-CM | POA: Diagnosis not present

## 2021-01-22 DIAGNOSIS — E785 Hyperlipidemia, unspecified: Secondary | ICD-10-CM | POA: Diagnosis present

## 2021-01-22 DIAGNOSIS — K811 Chronic cholecystitis: Secondary | ICD-10-CM | POA: Diagnosis not present

## 2021-01-22 DIAGNOSIS — J449 Chronic obstructive pulmonary disease, unspecified: Secondary | ICD-10-CM | POA: Diagnosis not present

## 2021-01-22 DIAGNOSIS — R918 Other nonspecific abnormal finding of lung field: Secondary | ICD-10-CM | POA: Diagnosis not present

## 2021-01-22 DIAGNOSIS — R578 Other shock: Secondary | ICD-10-CM | POA: Diagnosis not present

## 2021-01-22 DIAGNOSIS — G839 Paralytic syndrome, unspecified: Secondary | ICD-10-CM | POA: Diagnosis not present

## 2021-01-22 DIAGNOSIS — K6389 Other specified diseases of intestine: Secondary | ICD-10-CM | POA: Diagnosis not present

## 2021-01-26 ENCOUNTER — Other Ambulatory Visit: Payer: Medicare Other

## 2021-01-26 ENCOUNTER — Telehealth: Payer: Self-pay | Admitting: Family Medicine

## 2021-01-26 DIAGNOSIS — E785 Hyperlipidemia, unspecified: Secondary | ICD-10-CM

## 2021-01-26 DIAGNOSIS — E890 Postprocedural hypothyroidism: Secondary | ICD-10-CM

## 2021-01-26 DIAGNOSIS — M1A00X0 Idiopathic chronic gout, unspecified site, without tophus (tophi): Secondary | ICD-10-CM

## 2021-01-26 NOTE — Telephone Encounter (Signed)
-----   Message from Ellamae Sia sent at 01/12/2021 10:38 AM EST ----- Regarding: Lab orders for Monday, 12.5.22  AWV lab orders, please.

## 2021-02-01 ENCOUNTER — Other Ambulatory Visit: Payer: Self-pay | Admitting: Family Medicine

## 2021-02-02 ENCOUNTER — Ambulatory Visit: Payer: Medicare Other

## 2021-02-03 NOTE — Progress Notes (Deleted)
° °  Follow-up Outpatient Visit Date: 02/04/2021  Primary Care Provider: Jinny Sanders, MD [place] [id number]  Chief Complaint: ***  HPI:  Jason Solomon is a 80 y.o. male with history of ***, who presents for follow-up of ***.  --------------------------------------------------------------------------------------------------  ***  Recent CV Pertinent Labs: Lab Results  Component Value Date   CHOL 121 01/24/2020   HDL 50.30 01/24/2020   LDLCALC 55 01/24/2020   TRIG 81.0 01/24/2020   CHOLHDL 2 01/24/2020   K 3.6 12/30/2020   MG 2.0 01/16/2014   BUN 18 12/30/2020   BUN 13 08/11/2020   CREATININE 1.08 12/30/2020    Past medical and surgical history were reviewed and updated in EPIC.  No outpatient medications have been marked as taking for the 02/04/21 encounter (Appointment) with Zeyad Delaguila, Harrell Gave, MD.    Allergies: Ace inhibitors, Angiotensin receptor blockers, and Quinolones  Social History   Tobacco Use   Smoking status: Former    Packs/day: 2.00    Years: 30.00    Pack years: 60.00    Types: Cigarettes    Quit date: 1990    Years since quitting: 32.9   Smokeless tobacco: Never  Vaping Use   Vaping Use: Never used  Substance Use Topics   Alcohol use: Yes    Alcohol/week: 5.0 standard drinks    Types: 5 Glasses of wine per week   Drug use: No    Family History  Problem Relation Age of Onset   Hypertension Mother    COPD Brother    Lung cancer Brother 36   Throat cancer Brother    Hypertension Father    Stroke Father    Cancer Maternal Grandmother        unknown   Thyroid disease Neg Hx     Review of Systems: A 12-system review of systems was performed and was negative except as noted in the HPI.  --------------------------------------------------------------------------------------------------  Physical Exam: There were no vitals taken for this visit.  General:  NAD. Neck: No JVD or HJR. Lungs: Clear to auscultation  bilaterally without wheezes or crackles. Heart: Regular rate and rhythm without murmurs, rubs, or gallops. Abdomen: Soft, nontender, nondistended. Extremities: No lower extremity edema.  EKG:  ***  Lab Results  Component Value Date   WBC 4.1 12/12/2020   HGB 12.6 (L) 12/12/2020   HCT 39.4 12/12/2020   MCV 97.8 12/12/2020   PLT 199 12/12/2020    Lab Results  Component Value Date   NA 138 12/30/2020   K 3.6 12/30/2020   CL 104 12/30/2020   CO2 28 12/30/2020   BUN 18 12/30/2020   CREATININE 1.08 12/30/2020   GLUCOSE 96 12/30/2020   ALT 20 12/30/2020    Lab Results  Component Value Date   CHOL 121 01/24/2020   HDL 50.30 01/24/2020   LDLCALC 55 01/24/2020   TRIG 81.0 01/24/2020   CHOLHDL 2 01/24/2020    --------------------------------------------------------------------------------------------------  ASSESSMENT AND PLAN: Harrell Gave Daysha Ashmore, MD 02/03/2021 9:11 PM

## 2021-02-04 ENCOUNTER — Ambulatory Visit: Payer: Medicare Other | Admitting: Internal Medicine

## 2021-02-10 ENCOUNTER — Encounter: Payer: Medicare Other | Admitting: Family Medicine

## 2021-02-22 DEATH — deceased

## 2021-02-24 ENCOUNTER — Other Ambulatory Visit: Payer: Medicare Other

## 2021-02-24 ENCOUNTER — Ambulatory Visit: Payer: Medicare Other | Admitting: Oncology

## 2021-06-09 ENCOUNTER — Other Ambulatory Visit: Payer: Medicare Other

## 2021-06-15 ENCOUNTER — Ambulatory Visit: Payer: Medicare Other | Admitting: Oncology

## 2021-06-15 ENCOUNTER — Other Ambulatory Visit: Payer: Medicare Other

## 2021-06-16 ENCOUNTER — Encounter: Payer: Medicare Other | Admitting: Family Medicine

## 2021-06-18 ENCOUNTER — Ambulatory Visit: Payer: Medicare Other | Admitting: Endocrinology
